# Patient Record
Sex: Female | Born: 1935 | Race: White | Hispanic: No
Health system: Midwestern US, Community
[De-identification: ages and names within clinical notes are randomized; demographics above are authoritative.]

## PROBLEM LIST (undated history)

## (undated) DIAGNOSIS — G8929 Other chronic pain: Secondary | ICD-10-CM

## (undated) DIAGNOSIS — M1990 Unspecified osteoarthritis, unspecified site: Secondary | ICD-10-CM

## (undated) DIAGNOSIS — I10 Essential (primary) hypertension: Secondary | ICD-10-CM

## (undated) DIAGNOSIS — C50919 Malignant neoplasm of unspecified site of unspecified female breast: Secondary | ICD-10-CM

## (undated) DIAGNOSIS — M549 Dorsalgia, unspecified: Secondary | ICD-10-CM

## (undated) DIAGNOSIS — N390 Urinary tract infection, site not specified: Secondary | ICD-10-CM

## (undated) DIAGNOSIS — Z923 Personal history of irradiation: Secondary | ICD-10-CM

## (undated) DIAGNOSIS — K219 Gastro-esophageal reflux disease without esophagitis: Secondary | ICD-10-CM

## (undated) HISTORY — PX: CHOLECYSTECTOMY: SHX55

## (undated) HISTORY — PX: HERNIA REPAIR: SHX51

## (undated) HISTORY — PX: BREAST LUMPECTOMY: SHX2

## (undated) HISTORY — DX: Malignant neoplasm of unspecified site of unspecified female breast: C50.919

## (undated) HISTORY — PX: ABDOMINAL HYSTERECTOMY: SHX81

---

## 1997-07-03 ENCOUNTER — Other Ambulatory Visit: Admission: RE | Admit: 1997-07-03 | Discharge: 1997-07-03 | Payer: Self-pay | Admitting: Family Medicine

## 1998-07-08 ENCOUNTER — Encounter: Payer: Self-pay | Admitting: Family Medicine

## 1998-07-08 ENCOUNTER — Ambulatory Visit (HOSPITAL_COMMUNITY): Admission: RE | Admit: 1998-07-08 | Discharge: 1998-07-08 | Payer: Self-pay | Admitting: Family Medicine

## 1998-07-23 ENCOUNTER — Encounter: Payer: Self-pay | Admitting: Family Medicine

## 1998-07-23 ENCOUNTER — Ambulatory Visit (HOSPITAL_COMMUNITY): Admission: RE | Admit: 1998-07-23 | Discharge: 1998-07-23 | Payer: Self-pay | Admitting: Family Medicine

## 1998-08-09 ENCOUNTER — Encounter: Payer: Self-pay | Admitting: Family Medicine

## 1998-08-09 ENCOUNTER — Ambulatory Visit (HOSPITAL_COMMUNITY): Admission: RE | Admit: 1998-08-09 | Discharge: 1998-08-09 | Payer: Self-pay | Admitting: Family Medicine

## 1998-10-29 ENCOUNTER — Encounter: Payer: Self-pay | Admitting: Family Medicine

## 1998-10-29 ENCOUNTER — Ambulatory Visit (HOSPITAL_COMMUNITY): Admission: RE | Admit: 1998-10-29 | Discharge: 1998-10-29 | Payer: Self-pay | Admitting: Family Medicine

## 1999-07-21 ENCOUNTER — Ambulatory Visit (HOSPITAL_COMMUNITY): Admission: RE | Admit: 1999-07-21 | Discharge: 1999-07-21 | Payer: Self-pay | Admitting: Gastroenterology

## 1999-07-21 ENCOUNTER — Encounter: Payer: Self-pay | Admitting: Gastroenterology

## 2000-12-19 ENCOUNTER — Ambulatory Visit (HOSPITAL_COMMUNITY): Admission: RE | Admit: 2000-12-19 | Discharge: 2000-12-19 | Payer: Self-pay | Admitting: Gastroenterology

## 2001-02-12 ENCOUNTER — Encounter: Payer: Self-pay | Admitting: Gastroenterology

## 2001-02-12 ENCOUNTER — Ambulatory Visit (HOSPITAL_COMMUNITY): Admission: RE | Admit: 2001-02-12 | Discharge: 2001-02-12 | Payer: Self-pay | Admitting: Gastroenterology

## 2001-02-15 ENCOUNTER — Ambulatory Visit (HOSPITAL_COMMUNITY): Admission: RE | Admit: 2001-02-15 | Discharge: 2001-02-15 | Payer: Self-pay | Admitting: Gastroenterology

## 2001-02-15 ENCOUNTER — Encounter: Payer: Self-pay | Admitting: Gastroenterology

## 2001-03-26 ENCOUNTER — Ambulatory Visit (HOSPITAL_COMMUNITY): Admission: RE | Admit: 2001-03-26 | Discharge: 2001-03-26 | Payer: Self-pay | Admitting: Gastroenterology

## 2001-06-18 ENCOUNTER — Encounter: Payer: Self-pay | Admitting: Surgery

## 2001-06-21 ENCOUNTER — Encounter (INDEPENDENT_AMBULATORY_CARE_PROVIDER_SITE_OTHER): Payer: Self-pay | Admitting: Specialist

## 2001-06-22 ENCOUNTER — Encounter: Payer: Self-pay | Admitting: Surgery

## 2001-06-22 ENCOUNTER — Inpatient Hospital Stay (HOSPITAL_COMMUNITY): Admission: RE | Admit: 2001-06-22 | Discharge: 2001-06-24 | Payer: Self-pay | Admitting: Surgery

## 2001-11-18 ENCOUNTER — Ambulatory Visit (HOSPITAL_COMMUNITY): Admission: RE | Admit: 2001-11-18 | Discharge: 2001-11-18 | Payer: Self-pay | Admitting: Gastroenterology

## 2002-10-22 ENCOUNTER — Other Ambulatory Visit: Admission: RE | Admit: 2002-10-22 | Discharge: 2002-10-22 | Payer: Self-pay | Admitting: Family Medicine

## 2005-11-02 ENCOUNTER — Other Ambulatory Visit: Admission: RE | Admit: 2005-11-02 | Discharge: 2005-11-02 | Payer: Self-pay | Admitting: Family Medicine

## 2006-04-27 ENCOUNTER — Inpatient Hospital Stay (HOSPITAL_COMMUNITY): Admission: RE | Admit: 2006-04-27 | Discharge: 2006-04-28 | Payer: Self-pay | Admitting: Orthopaedic Surgery

## 2007-01-29 ENCOUNTER — Other Ambulatory Visit: Admission: RE | Admit: 2007-01-29 | Discharge: 2007-01-29 | Payer: Self-pay | Admitting: Family Medicine

## 2007-02-19 ENCOUNTER — Encounter: Admission: RE | Admit: 2007-02-19 | Discharge: 2007-02-19 | Payer: Self-pay | Admitting: Family Medicine

## 2008-03-22 ENCOUNTER — Encounter: Payer: Self-pay | Admitting: Emergency Medicine

## 2008-03-22 ENCOUNTER — Inpatient Hospital Stay (HOSPITAL_COMMUNITY): Admission: AD | Admit: 2008-03-22 | Discharge: 2008-03-27 | Payer: Self-pay | Admitting: Internal Medicine

## 2008-03-22 ENCOUNTER — Ambulatory Visit: Payer: Self-pay | Admitting: Diagnostic Radiology

## 2010-05-31 LAB — CBC
HCT: 34.9 % — ABNORMAL LOW (ref 36.0–46.0)
Hemoglobin: 12.1 g/dL (ref 12.0–15.0)
Hemoglobin: 13.3 g/dL (ref 12.0–15.0)
MCHC: 34.5 g/dL (ref 30.0–36.0)
MCHC: 34.7 g/dL (ref 30.0–36.0)
MCHC: 33.6 g/dL (ref 30.0–36.0)
MCV: 94.7 fL (ref 78.0–100.0)
MCV: 95 fL (ref 78.0–100.0)
MCV: 96.3 fL (ref 78.0–100.0)
Platelets: 205 10*3/uL (ref 150–400)
RBC: 3.68 MIL/uL — ABNORMAL LOW (ref 3.87–5.11)
RBC: 3.69 MIL/uL — ABNORMAL LOW (ref 3.87–5.11)
RBC: 4.06 MIL/uL (ref 3.87–5.11)
RDW: 13.4 % (ref 11.5–15.5)
RDW: 12.6 % (ref 11.5–15.5)
WBC: 11.5 10*3/uL — ABNORMAL HIGH (ref 4.0–10.5)

## 2010-05-31 LAB — DIFFERENTIAL
Basophils Absolute: 0.1 10*3/uL (ref 0.0–0.1)
Basophils Relative: 0 % (ref 0–1)
Basophils Relative: 1 % (ref 0–1)
Eosinophils Absolute: 0.1 10*3/uL (ref 0.0–0.7)
Eosinophils Absolute: 0.4 10*3/uL (ref 0.0–0.7)
Eosinophils Absolute: 0.5 10*3/uL (ref 0.0–0.7)
Eosinophils Absolute: 0.2 10*3/uL (ref 0.0–0.7)
Eosinophils Relative: 4 % (ref 0–5)
Eosinophils Relative: 5 % (ref 0–5)
Lymphocytes Relative: 14 % (ref 12–46)
Lymphocytes Relative: 25 % (ref 12–46)
Lymphs Abs: 2.8 10*3/uL (ref 0.7–4.0)
Lymphs Abs: 3.6 10*3/uL (ref 0.7–4.0)
Monocytes Absolute: 0.7 10*3/uL (ref 0.1–1.0)
Monocytes Absolute: 1.3 10*3/uL — ABNORMAL HIGH (ref 0.1–1.0)
Monocytes Relative: 11 % (ref 3–12)
Monocytes Relative: 7 % (ref 3–12)
Monocytes Relative: 9 % (ref 3–12)
Monocytes Relative: 5 % (ref 3–12)
Neutro Abs: 7.3 10*3/uL (ref 1.7–7.7)
Neutro Abs: 9.2 10*3/uL — ABNORMAL HIGH (ref 1.7–7.7)
Neutrophils Relative %: 64 % (ref 43–77)
Neutrophils Relative %: 73 % (ref 43–77)
Neutrophils Relative %: 70 % (ref 43–77)

## 2010-05-31 LAB — URINE CULTURE
Colony Count: NO GROWTH
Culture: NO GROWTH

## 2010-05-31 LAB — COMPREHENSIVE METABOLIC PANEL
ALT: 19 U/L (ref 0–35)
AST: 25 U/L (ref 0–37)
Calcium: 10.6 mg/dL — ABNORMAL HIGH (ref 8.4–10.5)
Creatinine, Ser: 1.1 mg/dL (ref 0.4–1.2)
GFR calc Af Amer: 57 mL/min — ABNORMAL LOW (ref >60)
Sodium: 138 mEq/L (ref 135–145)
Total Protein: 7.7 g/dL (ref 6.0–8.3)

## 2010-05-31 LAB — PROTIME-INR: INR: 0.9 (ref 0.00–1.49)

## 2010-05-31 LAB — URINALYSIS, ROUTINE W REFLEX MICROSCOPIC
Bilirubin Urine: NEGATIVE
Glucose, UA: NEGATIVE mg/dL
Hgb urine dipstick: NEGATIVE
Ketones, ur: 15 mg/dL — AB
Protein, ur: 30 mg/dL — AB
Urobilinogen, UA: 0.2 mg/dL (ref 0.0–1.0)

## 2010-05-31 LAB — BASIC METABOLIC PANEL
BUN: 12 mg/dL (ref 6–23)
CO2: 26 mEq/L (ref 19–32)
Calcium: 7.9 mg/dL — ABNORMAL LOW (ref 8.4–10.5)
Chloride: 102 mEq/L (ref 96–112)
Chloride: 108 mEq/L (ref 96–112)
Creatinine, Ser: 0.79 mg/dL (ref 0.4–1.2)
Creatinine, Ser: 0.94 mg/dL (ref 0.4–1.2)
Creatinine, Ser: 0.98 mg/dL (ref 0.4–1.2)
GFR calc Af Amer: >60 mL/min (ref >60)
GFR calc Af Amer: >60 mL/min (ref >60)
GFR calc non Af Amer: 57 mL/min — ABNORMAL LOW (ref >60)
Glucose, Bld: 104 mg/dL — ABNORMAL HIGH (ref 70–99)
Glucose, Bld: 92 mg/dL (ref 70–99)
Sodium: 134 mEq/L — ABNORMAL LOW (ref 135–145)

## 2010-05-31 LAB — CULTURE, BLOOD (ROUTINE X 2): Culture: NO GROWTH

## 2010-05-31 LAB — LACTIC ACID, PLASMA: Lactic Acid, Venous: 4.7 mmol/L — ABNORMAL HIGH (ref 0.5–2.2)

## 2010-05-31 LAB — APTT: aPTT: 23 seconds — ABNORMAL LOW (ref 24–37)

## 2010-05-31 LAB — URINE MICROSCOPIC-ADD ON

## 2010-06-28 NOTE — H&P (Signed)
[name], [name] NO.:  [id number]   MEDICAL RECORD NO.:  [id number]          PATIENT TYPE:  INP   LOCATION:  3312                         FACILITY:  [facility]   PHYSICIAN:  Ramiro Harvest, MD    DATE OF BIRTH:  1920/05/21   DATE OF ADMISSION:  03/22/2008  DATE OF DISCHARGE:                              HISTORY & PHYSICAL   PRIMARY CARE PHYSICIAN:  Chales Salmon. Abigail Miyamoto, M.D., of [facility].   GASTROENTEROLOGIST:  Llana Aliment. Randa Evens, M.D.   HISTORY OF PRESENT ILLNESS:  [name] is an 77 year old white  female with history of paraesophageal hernia status post open Nissen  fundoplication in 1980 with a partial takedown in May 2003 per Dr. Loreta Ave.  She is status post cholecystectomy, status post appendectomy, status  post partial hysterectomy, a history of hypertension, gastroesophageal  reflux disease who presented to the [facility] ED with a  several hour history of right lower quadrant abdominal pain which was a  15 out of a 10.  The patient stated that she had been having  intermittent abdominal pain for the past 2-3 years, usually relieves  with manual disimpaction and having a bowel movement as well as passing  gas.  The patient stated that she woke up this morning around 2 a.m.  with severe right lower quadrant pain which was constant in nature and  radiating to the upper abdomen.  The patient tried manual disimpaction,  did have a good bowel movement, however was still having severe pain.  The patient then tried some Pepto-Bismol with no relief and then finally  woke her daughter up around 6 a.m. and daughter took her to the  emergency department.  Patient does endorse some nausea, some dry  heaves, some chills and some diaphoresis.  The patient does have chronic  constipation as well.  The patient denies any fever, no chest pain, no  shortness of breath, no melena, no hematemesis, no hematochezia, no  diarrhea, no weakness, no cough, no  dysuria, no focal neurological  symptoms.  The patient was seen in the ED.  A CT scan done was  consistent with a distal small bowel obstruction.  CBC done had a white  count of 14.8, hemoglobin of 15.5, hematocrit of 46.2, platelets of 275,  an ANC of 10.3.  Comprehensive metabolic profile was within normal  limits.  Urinalysis was bland.  Coags with a PTT of 23, PT of 12.4, INR  of 0.9, lactic acid level of 4.7 and lipase of 104.  Chest x-ray showed  right basilar atelectasis or scarring.  CT of the abdomen and pelvis  showed no acute intra-abdominal finding, however, showed a distal small  bowel obstruction with focal transition point at the level of the ileum  in the right lower quadrant, no evidence of perforation, no masses  although this could be obscured.  We were called to admit the patient.  [facility] will be consulting on the patient.   PAST MEDICAL HISTORY:  1. Hypertension.  2. Diverticulosis.  3. Gastroesophageal reflux disease.  4. History of skin cancer.  5. Cataracts  status post surgery.  6. A history of open Nissen fundoplication in 1980 with evidence of      herniation and paraesophageal hernia.  7. Status post partial takedown of open fundoplication, per Dr.      Daphine Deutscher, in May of 2003.  8. History of status post cholecystectomy.  9. Status post appendectomy.  10.Status post partial hysterectomy.  11.Status post dilatation of the GE junction per Dr. Randa Evens in      February 2003.  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  13.Hyperlipidemia.  14.Hypertension.  15.Spinal stenosis with facet cyst on the right L3-L4, status post      excision of a canal cyst and decompression on the right L3-L4 per      Dr. Ophelia Charter in March 2008.   HOME MEDICATIONS:  1. Advil PM 200 mg p.o. nightly p.r.n.  2. Aspirin 325 mg p.o. daily.  3. Metoprolol 25 mg p.o. daily.  4. Triamterene/HCTZ 25/37.5 p.o. daily.  5. Lipitor 40 mg p.o.  daily.  6. Nexium 40 mg p.o. daily.   SOCIAL HISTORY:  The patient is widowed, lives in [place] with her  daughter.  No tobacco use.  No alcohol use.  No IV drug use.   FAMILY HISTORY:  Noncontributory.   REVIEW OF SYSTEMS:  As per HPI.  Also positive for intermittent  dysphagia and also positive for intermittent abdominal pain and  constipation.   PHYSICAL EXAMINATION:  Temperature 97.5, blood pressure 169/94, pulse of  84, respiratory rate 20, saturating 100% on room air.  GENERAL:  Patient laying in bed in no apparent distress.  HEENT:  Normocephalic, atraumatic.  Pupils equal, round and reactive to  light and accommodation.  Extraocular movements intact.  Oropharynx is  clear.  No lesions, no exudates.  NECK:  Supple.  No lymphadenopathy.  Dry mucous membranes.  RESPIRATORY:  Lungs are clear to auscultation bilaterally.  CARDIOVASCULAR:  Regular rate, rhythm.  No murmurs, rubs or gallops.  ABDOMEN:  Soft, nontender, nondistended.  Decreased but positive bowel  sounds.  No rebound, no guarding.  EXTREMITIES:  No clubbing, cyanosis or edema.  NEUROLOGICAL:  The patient is alert and oriented x3.  Cranial nerves II-  XII are grossly intact.  No focal deficits.   ADMISSION LABS:  CBC:  White count 14.8, hemoglobin 15.5, platelets 275,  hematocrit 46.2, ANC of 10.3, lactic acid of 4.7, lipase 104, PTT 23, PT  12.4, INR 0.9, sodium of 138, potassium 4.1, chloride 100, bicarbonate  25, BUN 23, creatinine 1.1, glucose 143, bilirubin 0.6, alkaline  phosphatase 112, AST 25, ALT 19.  Protein of 7.7, albumin 4.3, calcium  of 10.6.  Urinalysis:  UA was yellow, cloudy, specific gravity 1.026, pH  of 5.5, glucose negative, bilirubin negative, ketones 15, blood  negative, protein 30, urobilinogen 0.2, nitrite negative, leukocytes  negative.  Urine microscopy:  WBC 0-2, RBC 0-2, bacteria is few.  A  chest x-ray showed right basilar atelectasis or scarring.  CT of the  abdomen and pelvis  shows no acute intra-abdominal findings.  CT of the  pelvis with a distal small bowel obstruction with focal transition point  at the level of the ileum in the right lower quadrant, no evidence of  perforation, no mass lesion is seen although this could be obscured,  differential also includes adhesions.   ASSESSMENT AND PLAN:  Ms. [name] is an 77 year old female with  a history of prior esophageal hernia status post open Nissen  fundoplication  in 1980 with status post partial takedown of open  fundoplication, per Dr. Daphine Deutscher, in May 2003 status post cholecystectomy,  status post appendectomy, status post partial hysterectomy presenting to  the emergency department with right lower quadrant pain, found to have a  distal small bowel obstruction.  1. Distal small bowel obstruction likely secondary to adhesions versus      a neoplasm.  Admit to Step Down Unit.  Will make nothing by mouth.      Will place on bowel rest.  Will check a magnesium level, place on      IV fluids, supportive care, pain management, serial abdominal x-      rays.  Will consult with general surgery for further evaluation and      management.  2. Leukocytosis.  Likely a reactive leukocytosis secondary to problem      number 1 versus an infectious etiology.  However, lactic acid level      is elevated at 4.7, lipase level is normal.  UA is negative.  Chest      x-ray is negative.  Blood cultures are pending.  Will place      empirically on IV Zosyn for now and follow.  3. Dehydration.  Hydrate with intravenous fluids.  4. Gastroesophageal reflux disease.  Protonix.  5. Diverticulosis.  6. Hyperlipidemia.  Hold Lipitor.  7. Hypertension.  Hold blood pressure medications for now.  8. Status post Nissen fundoplication with partial takedown.  9. Prophylaxis.  Protonix for gastrointestinal prophylaxis, sequential      compression devices for deep vein thrombosis prophylaxis.   It has been a pleasure taking care  of Ms. [name].      Ramiro Harvest, MD  Electronically Signed     DT/MEDQ  D:  03/22/2008  T:  03/22/2008  Job:  [id number]   cc:   Chales Salmon. Abigail Miyamoto, M.D.

## 2010-06-28 NOTE — Discharge Summary (Signed)
[name], [name]             ACCOUNT NO.:  [id number]   MEDICAL RECORD NO.:  [id number]          PATIENT TYPE:  INP   LOCATION:  5121                         FACILITY:  [facility]   PHYSICIAN:  [name], M.D.DATE OF BIRTH:  1921/02/07   DATE OF ADMISSION:  03/22/2008  DATE OF DISCHARGE:  03/27/2008                               DISCHARGE SUMMARY   DISCHARGE DIAGNOSES:  1. Small bowel obstruction - resolved.  2. Uncontrolled hypertension.  3. Gastroesophageal reflux disease.  4. History of diverticulosis.  5. History of spinal stenosis.  6. History of hypercholesterolemia.  7. History of open Nissen fundoplication in 1980, with evidence of      herniation and paraesophageal hernia, status post partial takedown      of open fundoplication per Dr. Daphine Deutscher in May 2003.  8. History of cholecystectomy in the past.  9. History of appendectomy.  10.History of partial hysterectomy.  11.History of skin cancer.   PROCEDURES AND STUDIES:  1. CT scan of abdomen and pelvis - distal small bowel obstruction with      focal transition point at the level of the ileum in the right lower      quadrant.  No evidence for perforation.  No mass lesion identified.      Differential considerations include adhesion.  2. Follow-up abdominal x-ray on March 25, 2008 - improved small      bowel distention, likely related to resolving obstruction.   CONSULTATIONS:  [facility], Dr. Violeta Gelinas.   BRIEF HISTORY:  The patient is an 77 year old white female with the  above-listed medical problems who presented with right lower quadrant  abdominal pain, along with nausea and dry heaves.  She reported a  history of chronic constipation and that she sometimes had to do manual  disimpaction that usually relieved her symptoms.  She was seen in the ER  and a CT scan of her abdomen was done which was consistent with a distal  small bowel obstruction.  She was noted to have an elevated white cell  count of 14.8 and she was admitted for further evaluation and  management.   Please see the full admission history and physical dictated on March 22, 2008, by Dr. Ramiro Harvest for the details of the admission  physical exam, as well as the laboratory data.   HOSPITAL COURSE:  1. Small bowel obstruction - as discussed above upon admission she was      kept n.p.o., hydrated with IV fluids.  Surgery was consulted and      followed the patient.  Because her abdominal pain was persisting,      an NG tube was placed.  She was also started on IV Reglan.      Following this, she began having bowel movements and subsequently      her abdominal pain resolved and surgery discontinued the NG tube      once the output had slowed down.  She had several follow-up      abdominal films while in the hospital and they began to show slight      improvement in  small bowel distention on March 24, 2008, and      another follow-up abdominal x-ray on March 25, 2008, also showed      improved small bowel distention, likely relating to the resolving      obstruction.  Surgery followed the patient and started her on a      liquid diet.  She continued to improve and so they followed and      advanced her diet to solids.  She is tolerating p.o. well and      having bowel movements at this time.  Surgery saw her on rounds      today and have indicated it is okay for her to be discharged from      their standpoint.  They also have indicated that she does not need      any outpatient surgery follow-up.  The impression was that the      small bowel obstruction was likely secondary to adhesions given her      previous abdominal surgeries.  2. Uncontrolled hypertension - the patient was kept n.p.o. while in      the hospital and so initially was unable to get her oral p.o.      medications.  Her blood pressures initially did well, but      subsequently were uncontrolled and she was started on scheduled IV       beta-blockers.  With this, her blood pressures have improved.  Her      last blood pressure on recheck today was 126/74.  She is to      continue her outpatient medications upon discharge and follow up      with her primary care physician.  3. GERD - she was maintained on the PPI during her hospital stay.  4. Volume depletion - she was hydrated with IV fluids while in the      hospital.  5. Leukocytosis - she was initially empirically started on IV      antibiotics pending workup - her urinalysis was negative for      infection and chest x-ray with no acute infiltrates.  The      impression was that this was likely reactive secondary to #1.  The      leukocytosis has resolved, the antibiotics were discontinued.  She      has remained afebrile and hemodynamically stable and will not      require any further antibiotics upon discharge.  Her last white      cell count today prior to discharge is 8.0.  6. Her other chronic medical problems remained stable during this      hospital stay and she is to continue her outpatient medications      upon discharge.   DISCHARGE MEDICATIONS:  She is to continue her preadmission medications.  1. Aspirin 325 mg daily.  2. Metoprolol 25 mg daily.  3. Triamterene/hydrochlorothiazide 25/37.5 mg p.o. daily.  4. Lipitor 40 mg p.o. daily.  5. Advil PM p.r.n.   DISCHARGE CONDITION:  Improved/stable.   FOLLOW-UP CARE:  Dr. Abigail Miyamoto in 1-2 weeks, call for appointment.      [name], M.D.  Electronically Signed     ACV/MEDQ  D:  03/27/2008  T:  03/27/2008  Job:  [id number]   cc:   Chales Salmon. Abigail Miyamoto, M.D.  Gabrielle Dare [name], M.D.

## 2010-06-28 NOTE — Consult Note (Signed)
[name]             ACCOUNT NO.:  [id number]   MEDICAL RECORD NO.:  [id number]          PATIENT TYPE:  INP   LOCATION:  3312                         FACILITY:  [facility]   PHYSICIAN:  Gabrielle Dare. [name], M.D.DATE OF BIRTH:  [birth year]   DATE OF CONSULTATION:  03/22/2008  DATE OF DISCHARGE:                                 CONSULTATION   PRIMARY CARE PHYSICIAN:  Dr. Abigail Miyamoto.   GI PHYSICIAN:  James L. Randa Evens, M.D.   REASON FOR CONSULTATION:  Small bowel obstruction.   HISTORY OF PRESENT ILLNESS:  [name] is an 77 year old white female  with history of several abdominal operations, the most recent one is a  laparoscopic redo Nissen fundoplication by Dr. Daphine Deutscher from our practice  in 2003.  The patient developed right lower quadrant abdominal pain this  morning with associated dry heaves.  She was seen at [facility] where she underwent CT scan of the abdomen and  pelvis.  This shows distal small bowel obstruction likely secondary to  adhesions.  She was given some pain medication down there.  Her pain has  resolved at this time, and she has no current nausea.  She was accepted  and transferred for admission here by [facility].   PAST MEDICAL HISTORY:  1. Hypertension.  2. Diverticulosis.  3. Spinal stenosis.  4. Hypercholesterolemia.  5. GERD.   PAST SURGICAL HISTORY:  Open Nissen and hiatal hernia repair,  laparoscopic redo Nissen, appendectomy, skin cancer removal,  cholecystotomy, and spine surgery.   HOME MEDICATIONS:  Advil, aspirin, metoprolol,  triamterene/hydrochlorothiazide, Lipitor, and Nexium.   ALLERGIES:  CODEINE causing nausea.   REVIEW OF SYSTEMS:  Significant for the GI findings as above.  The  remainder was unremarkable.   PHYSICAL EXAMINATION:  VITAL SIGNS:  Temperature 97.5, blood pressure  169/94, heart rate 84, respirations 20, and saturations 100% on room  air.  GENERAL:  She is awake and alert.   She is in no distress.  HEENT:  She is normocephalic.  Pupils equal and reactive.  Oral mucosa  is dry.  NECK:  Supple with no tenderness.  LUNGS:  Clear to auscultation with good respiratory effort.  CARDIAC:  Heart is regular with no murmurs.  Impulses palpable in the  left chest.  Distal pulses are 1+ with no significant peripheral edema.  ABDOMEN:  Soft with minimal distention.  There is no significant  tenderness.  No masses are felt.  Bowel sounds are hypoactive.  No  organomegaly is detected on palpation.  SKIN:  Warm and dry.  No rashes are noted.  MUSCULOSKELETAL:  There is no tenderness or deformity.   LABORATORY STUDIES:  Sodium 138, potassium 4.1, chloride 100, CO2 of 25,  BUN 23, creatinine 1.1, and glucose 143.  White blood cell count 14.8  and hemoglobin 15.5.  Liver function test within normal limits.  Lipase  104.  INR 0.9.   IMPRESSION:  Small bowel obstruction likely secondary to adhesions.   RECOMMENDATIONS:  I agree with IV fluid resuscitation and bowel rest as  ordered by the medical  service and I discussed this case with Dr.  [name] from [facility] hospitalist.  I would recommend placing NG tube if  she vomits.  We will follow along with you closely.  If she does not  improve, she may need exploratory laparotomy with lysis of adhesions  over the next 24 to 48 hours and the plan was discussed in detail with  the patient and her daughter.  Questions were answered.      Gabrielle Dare [name], M.D.  Electronically Signed     BET/MEDQ  D:  03/22/2008  T:  03/23/2008  Job:  [id number]

## 2010-06-29 ENCOUNTER — Emergency Department (HOSPITAL_COMMUNITY): Payer: Medicare Other

## 2010-06-29 ENCOUNTER — Emergency Department (HOSPITAL_COMMUNITY)
Admission: EM | Admit: 2010-06-29 | Discharge: 2010-06-29 | Disposition: A | Discharge status: Home / Self Care | Payer: Medicare Other | Attending: Emergency Medicine | Admitting: Emergency Medicine

## 2010-06-29 ENCOUNTER — Encounter: Payer: Self-pay | Admitting: Cardiology

## 2010-06-29 DIAGNOSIS — M1990 Unspecified osteoarthritis, unspecified site: Secondary | ICD-10-CM | POA: Insufficient documentation

## 2010-06-29 DIAGNOSIS — Z7982 Long term (current) use of aspirin: Secondary | ICD-10-CM | POA: Insufficient documentation

## 2010-06-29 DIAGNOSIS — R0602 Shortness of breath: Secondary | ICD-10-CM | POA: Insufficient documentation

## 2010-06-29 DIAGNOSIS — I10 Essential (primary) hypertension: Secondary | ICD-10-CM | POA: Insufficient documentation

## 2010-06-29 DIAGNOSIS — Z79899 Other long term (current) drug therapy: Secondary | ICD-10-CM | POA: Insufficient documentation

## 2010-06-29 DIAGNOSIS — R61 Generalized hyperhidrosis: Secondary | ICD-10-CM | POA: Insufficient documentation

## 2010-06-29 DIAGNOSIS — K219 Gastro-esophageal reflux disease without esophagitis: Secondary | ICD-10-CM | POA: Insufficient documentation

## 2010-06-29 DIAGNOSIS — E7800 Pure hypercholesterolemia, unspecified: Secondary | ICD-10-CM | POA: Insufficient documentation

## 2010-06-29 DIAGNOSIS — F341 Dysthymic disorder: Secondary | ICD-10-CM | POA: Insufficient documentation

## 2010-06-29 DIAGNOSIS — R609 Edema, unspecified: Secondary | ICD-10-CM | POA: Insufficient documentation

## 2010-06-29 DIAGNOSIS — R079 Chest pain, unspecified: Secondary | ICD-10-CM | POA: Insufficient documentation

## 2010-06-29 LAB — COMPREHENSIVE METABOLIC PANEL
ALT: 25 U/L (ref 0–35)
AST: 24 U/L (ref 0–37)
Alkaline Phosphatase: 101 U/L (ref 39–117)
CO2: 31 mEq/L (ref 19–32)
Calcium: 10.3 mg/dL (ref 8.4–10.5)
GFR calc Af Amer: >60 mL/min (ref >60)
Glucose, Bld: 110 mg/dL — ABNORMAL HIGH (ref 70–99)
Potassium: 3.8 mEq/L (ref 3.5–5.1)
Sodium: 135 mEq/L (ref 135–145)
Total Protein: 6.8 g/dL (ref 6.0–8.3)

## 2010-06-29 LAB — CBC
HCT: 41 % (ref 36.0–46.0)
Hemoglobin: 14 g/dL (ref 12.0–15.0)
MCHC: 34.1 g/dL (ref 30.0–36.0)
MCV: 91.5 fL (ref 78.0–100.0)
RDW: 13.1 % (ref 11.5–15.5)

## 2010-06-29 LAB — POCT CARDIAC MARKERS: CKMB, poc: <1 ng/mL — ABNORMAL LOW (ref 1.0–8.0)

## 2010-07-01 NOTE — Procedures (Signed)
[facility]  Patient:    [name], [name] Visit Number: [id number] MRN: [id number]          Service Type: SUR Location: 1W 0153 01 Attending Physician:  Katha Cabal Dictated by:   Sabino Gasser, M.D. Proc. Date: 06/21/01 Admit Date:  06/21/2001   CC:         [place]. Daphine Deutscher, M.D.  Chales Salmon. Abigail Miyamoto, M.D.  James L. Randa Evens, M.D.   Procedure Report  PROCEDURE:  Upper endoscopy.  HISTORY:  I was asked by Dr. Daphine Deutscher to endoscope this patient in the operating room because of difficulty he had in passing the endoscope so that he could visualize the stomach and GE junction.  ANESTHESIA:  General anesthesia was already given.  DESCRIPTION OF PROCEDURE:  With the patient in the recumbent position, the Olympus videoscopic endoscope was inserted into the mouth and passed blindly into the esophagus easily. We were able to pass distally through the esophagus into the stomach, which appeared grossly normal. We placed the endoscope in retroflexion to view the GE junction from below and Dr. Daphine Deutscher assessed that there was still some degree of wrap present. A photograph was taken at this point. The endoscope was then straightened and withdrawn and placed at the GE junction at Dr. Norva Riffle request. At that point, they were able to identify surgically the site of the GE junction laparoscopically. The endoscope was then withdrawn partially and it was decided to leave the endoscope in place at this point after suctioning air out of the stomach in case this was needed further. The patient tolerated the endoscopic procedure well without apparent complications.  FINDINGS:  Partial takedown of open fundoplication done previously; otherwise an unremarkable examination. Dictated by:   Sabino Gasser, M.D. Attending Physician:  Katha Cabal DD:  06/21/01 TD:  06/22/01 Job: [id number]

## 2010-07-01 NOTE — Op Note (Signed)
[facility]  Patient:    [name], [name] Visit Number: [id number] MRN: [id number]          Service Type: SUR Location: 4W 0462 01 Attending Physician:  Katha Cabal Dictated by:   [place] Daphine Deutscher, M.D. Admit Date:  06/21/2001 Discharge Date: 06/24/2001   CC:         Chales Salmon. Abigail Miyamoto, M.D.  James L. Randa Evens, M.D.   Operative Report  PREOPERATIVE DIAGNOSIS:  Prior open Nissen fundoplication by Dr. Langston Masker in the early 1980s with evidence of paraesophageal recurrence, dysphagia.  PROCEDURES:  Laparoscopic revision of prior open Nissen fundoplication with laparoscopic takedown of wrap, closure of diaphragm, and redo laparoscopic Nissen fundoplication.  COURSE IN HOSPITAL:  [name] is a 77 year old lady who underwent the aforementioned operation on Friday, Jun 21, 2001.  Postoperatively, she did well.  On Saturday, she had a swallow which showed some slow emptying but no evidence of leak.  The patient tolerated liquids well on Sunday, and by Monday, May 12, was ready for discharge.  DISCHARGE MEDICATIONS:  She was given Mepergan Fortis to take for pain.  DISCHARGE INSTRUCTIONS:  An instruction sheet with a postoperative Nissen diet modification was given to her.  FOLLOWUP:  She was instructed to return to the office in two weeks.  CONDITION UPON DISCHARGE:  Improved. Dictated by:   [place] Daphine Deutscher, M.D. Attending Physician:  Katha Cabal DD:  06/24/01 TD:  06/25/01 Job: [id number]

## 2010-07-01 NOTE — Op Note (Signed)
   NAME:  [name], [name]                       ACCOUNT NO.:  [id number]   MEDICAL RECORD NO.:  [id number]                   PATIENT TYPE:  AMB   LOCATION:  ENDO                                 FACILITY:  [facility]   PHYSICIAN:  James L. Malon Kindle., M.D.          DATE OF BIRTH:  March 18, 1920   DATE OF PROCEDURE:  11/18/2001  DATE OF DISCHARGE:                                 OPERATIVE REPORT   PROCEDURE PERFORMED:  Colonoscopy.   ENDOSCOPIST:  Llana Aliment. Edwards, M.D.   MEDICATIONS:  Fentanyl 75 mcg, Versed 7.5 mg IV.   INSTRUMENT USED:  Pediatric Olympus video colonoscope.   INDICATIONS FOR PROCEDURE:  The patient has a strong family history of colon  cancer, has never had a colonoscopy.  She is status post Nissen  fundoplication times two.   DESCRIPTION OF PROCEDURE:  The procedure had been explained to the patient  and consent obtained.  With the patient in the left lateral decubitus  position, the pediatric adjustable Olympus video colonoscope was inserted  and advanced under direct visualization.  The prep was good.  The patient  had extensive diverticular disease.  Multiple maneuvers and some time were  taken to pass the left colon.  After we were able to pass this, we were able  to advance to the cecum.  The ileocecal valve and appendiceal orifice were  seen.  The scope was withdrawn and the cecum, ascending colon, hepatic  flexure, transverse colon, splenic flexure, descending and sigmoid colon  were seen well.  Extensive diverticular disease of the left colon,  particularly the sigmoid.  Multiple diverticula, no gross polyps seen.  The  scope was withdrawn to the rectum.  The rectum was free of polyps.  The  scope was withdrawn.  The patient tolerated the procedure well.   ASSESSMENT:  1. Extensive diverticulosis.  2. Family history of colon cancer with no obvious neoplasia.   PLAN:  Will recommend yearly hemoccults with colonoscopy in the future only  for specific  indications such as heme positive stool or anemia.                                                James L. Malon Kindle., M.D.    Waldron Session  D:  11/18/2001  T:  11/18/2001  Job:  [id number]   cc:   [place] Daphine Deutscher, M.D.  Fax: [phone]   Chales Salmon. Abigail Miyamoto, M.D.

## 2010-07-01 NOTE — Procedures (Signed)
[place]. [facility]  Patient:    [name], [name] Visit Number: [id number] MRN: [id number]          Service Type: END Location: ENDO Attending Physician:  Orland Mustard Dictated by:   Llana Aliment. Randa Evens, M.D. Proc. Date: 02/12/01 Admit Date:  02/12/2001 Discharge Date: 02/12/2001   CC:         Chales Salmon. Abigail Miyamoto, M.D.  [place] Daphine Deutscher, M.D.   Procedure Report  DATE OF BIRTH:  September 08, 1920.  PROCEDURE:  Esophageal manometry.  DESCRIPTION OF PROCEDURE:  The procedure was performed in the [facility] manometry lab in the usual fashion.  No provocative studies were performed, and the results are as follows:  1. Lower esophageal sphincter:  LES pressure is 26.6 with only 68%    relaxation. 2. Esophageal body:  Mean amplitude in the distal esophagus 71 mm, with mean    duration of 8.2 seconds.  The patient had a number of wet swallows that    were obtained.  All these appeared to have normal peristalsis. 3. Upper esophageal sphincter:   Does tend to relax with swallowing.  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  This apparently may be related to her previous antireflux surgery.  She is having a lot of difficulty swallowing, and it may well be that this is not entirely relaxing.  PLAN:  Will have her see Dr. Daphine Deutscher.  Will plan on seeing her back in the office. Dictated by:   Llana Aliment. Randa Evens, M.D. Attending Physician:  Orland Mustard DD:  02/28/01 TD:  03/01/01 Job: [id number]

## 2010-07-01 NOTE — Op Note (Signed)
[facility]  Patient:    [name], [name] Visit Number: [id number] MRN: [id number]          Service Type: SUR Location: 4W 0462 01 Attending Physician:  Katha Cabal Dictated by:   [place] Daphine Deutscher, M.D. Proc. Date: 06/21/01 Admit Date:  06/21/2001   CC:         Chales Salmon. Abigail Miyamoto, M.D.  James L. Randa Evens, M.D.   Operative Report  PREOPERATIVE DIAGNOSIS:  Open Nissen fundoplication in 1980 per Dr. _______ Langston Masker with evidence of herniation and paraesophageal hernia.  PROCEDURE:  Laparoscopic takedown of prior wrap, closure of diaphragm, redo Nissen fundoplication with the laparoscope.  SURGEON:  [place]. Daphine Deutscher, M.D.  ASSISTANT:  Sharlet Salina T. Hoxworth, M.D.  NOTE:  Takedown of adhesions from prior surgery including transverse colon to the anterior abdominal wall, liver from stomach--1-1/2 hours.  TOTAL PROCEDURE TIME:  4-1/2 hours.  DESCRIPTION OF PROCEDURE:  Ms. Scaturro was taken to Room #1 on Friday, Jun 21, 2001 and given general anesthesia. The abdomen was prepped with Betadine and draped sterilely. Just in the supraumbilical region where her previous midline incision was, I made a longitudinal incision through the old incision and then removed an interrupted Prolene suture and entered the abdomen. She had many adhesions to the anterior abdominal wall and after putting the 30 degree scope in, I withdrew the device and put my finger in and bluntly dissected _______ from the anterior abdominal wall. Then eventually the 45 degree scope and with a 10/11 in the right upper quadrant and a 10/11 in the left upper quadrant, we used the Harmonic scalpel and did a very careful tedious dissection and took down all of these adhesions using the Harmonic scalpel. This was very difficult and very tedious and it took 1-1/2 hours of dissection time. When this had been done, we had moved to the liver region which was also stuck to the anterior  abdominal wall. We took this down and then separated the stomach from the liver. We encountered some bleeding from the left lateral segment but this was controlled with the Harmonic scalpel.  We then carefully did a laparoscopic dissection and eventually showed where it appeared her stomach had gone above the wrap and was up in the chest. We dissected the hiatus and were able to reduce this herniated wrap. We then delineated the right and left crus. We then found the sutures where the wrap had been put together and removed those, and were able to undo the wrap and then I was unable to allow the stomach to go back around to its normal position. At this point, we had the endoscope and Dr. Virginia Rochester was the only one available that came in and he performed endoscopy slipping the endoscope into the stomach where we got a good retroflex view of the EG junction and then pulled it back where we could see the Z-line and this corresponded to our estimates on the outside.  The scope was then eventually just pulled out. We then closed the hiatus with a total of five sutures posteriorly and this were all tied extracorporeally. I then brought the stomach around and did a shoeshine maneuver and used the contiguous portion of the stomach to recreate a wrap suturing it high on the esophagus above the Z-line and then tied that down extracorporally. This was followed with two intracorporeally tied sutures, again incorporating the stomach, esophagus, and the wrapped stomach. Prior to doing this, she had a big lump  of fat up there at the cardia in the notch and I took that off and it contained a large lymph node. I used the Harmonic scalpel to do that. This was sent for permanent sections. The wrap looked nice when we had completed. We took a picture for the chart and then we looked around at the abdominal cavity and did not see any evidence of any bleeding or any visceral perforation. The port sites were all  injected with Marcaine. The umbilical port was repaired under direct vision with three sutures of 0 Vicryl. The other ports had been placed very obliquely and closed when the abdomen was deflated. The area was surveyed and looked good. The patient seemed to tolerate the procedure well. The wounds were closed with 4-0 Vicryl and benzoin and Steri-Strips. She was awakened and taken to the recovery room in satisfactory condition. Dictated by:   [place] Daphine Deutscher, M.D. Attending Physician:  Katha Cabal DD:  06/21/01 TD:  06/23/01 Job: [id number]

## 2010-07-01 NOTE — Procedures (Signed)
[place]. [facility]  Patient:    [name], [name] Visit Number: [id number] MRN: [id number]          Service Type: END Location: ENDO Attending Physician:  Orland Mustard Dictated by:   Llana Aliment. Randa Evens, M.D. Proc. Date: 03/26/01 Admit Date:  03/26/2001   CC:         [place]. Daphine Deutscher, M.D.  Chales Salmon. Abigail Miyamoto, M.D.   Procedure Report  DATE OF BIRTH:  July 21, 1918.  PROCEDURE:  Esophagogastroduodenoscopy with TTS balloon dilatation of gastroesophageal junction.  HISTORY:  [name] is a nice 77 year old woman who has previously undergone an open Nissen fundoplication many years ago by Dr. Verne Carrow and has had persistent problems since then.  The patient has had problems with pain and swallowing.  She has seen Dr. Daphine Deutscher, and he has considered redoing her Nissen fundoplication.  Her esophageal manometry showed an LES pressure of 25.6 with some delayed motility issues.  The patient has continued to have lots of symptoms, and this procedure is done after discussion with Dr. Daphine Deutscher to see if enlarging the opening would be helpful.  DESCRIPTION OF PROCEDURE:  The procedure, including potential risks and benefits, were explained to the patient.  The possibility of bleeding and perforation were explained.  With the patient in the left lateral decubitus position, the Olympus video scope was inserted and advanced down into the distal esophagus.  There was some retained liquid material in the distal esophagus.  The wrap appeared intact.  It easily allowed passage of the scope. The stomach was identified and pylorus identified and passed.  The duodenum, including the bulb and second portion, was unremarkable.  The antrum and body were normal.  Fundus and cardia were seen well on retroflex view and were normal.  The scope was withdrawn several times back and forth through the GE junction.  Although it was tight, it could easily pass the scope, which  is about 27 mm.  I elected to go ahead with the 12 mm or 36 French TTS balloon, which was inflated with some small amount of heme right at the GE junction. It stretched easily, and the balloon easily passed within the opening while inflated.  Again, it was just a small amount of heme.  The scope and balloon were then withdrawn.  I elected not to try to dilate this further, since it was not clear that this was clearly stenotic.  The scope was withdrawn.  The patient tolerated the procedure well.  ASSESSMENT:  Dilation of gastroesophageal junction to 36 [place].  PLAN:  Will follow the patient clinically, have her call to report next week, and see how she does.  She will remain on clear liquids for the next several hours. Dictated by:   Llana Aliment. Randa Evens, M.D. Attending Physician:  Orland Mustard DD:  03/26/01 TD:  03/26/01 Job: [id number]

## 2010-07-01 NOTE — Procedures (Signed)
[place]. [facility]  Patient:    [name], BOCCHINO Visit Number: [id number] MRN: [id number]          Service Type: END Location: ENDO Attending Physician:  Orland Mustard Dictated by:   Llana Aliment. Randa Evens, M.D. Proc. Date: 12/19/00 Admit Date:  12/19/2000   CC:         Chales Salmon. Abigail Miyamoto, M.D.                           Procedure Report  PROCEDURE:  Esophagogastroduodenoscopy.  MEDICATIONS:  Fentanyl 25 mcg, Versed 5 mg IV.  INDICATION:  A 77 year old who had an open Nissen fundoplication a number of years ago, had dysmotility of the esophagus on an upper GI with a questionable paraesophageal hiatal hernia.  She has had vague upper GI symptoms. She was started back on Nexium, which had not really helped.  The procedure is done to evaluate the upper GI tract.  DESCRIPTION OF PROCEDURE:  The procedure had been explained to the patient and consent obtained.  With the patient in the left lateral decubitus position, the Olympus adult video endoscope was inserted and advanced under direct visualization.  The distal esophagus was reached.  The patient was seen to have a hiatal hernia.  Otherwise, everything looked okay there.  We easily passedinto the stomach.  The pyloric channel was identified and passed.  The duodenum, including the bulb and the second portion, were unremarkable.  The scope was withdrawn back into the stomach, and the antrum and body were normal.  Fundus and cardia seen well on the retroflex view and were normal. The wrap appeared to be completely intact.  There was a hiatal hernia with some gastric mucosa up in the chest, but there was no obvious stricture at the junction.  The scope was withdrawn.  The patient tolerated the procedure well.  ASSESSMENT:  Vague upper gastrointestinal symptoms, could well be due to esophageal dysmotility, regurgitation of contents.  There was no obvious reflux and no obvious paraesophageal hiatal  hernia.  PLAN:  Will give a trial of Reglan and see back in the office in three to four weeks. Dictated by:   Llana Aliment. Randa Evens, M.D. Attending Physician:  Orland Mustard DD:  12/19/00 TD:12/20/00 Job: [id number]

## 2010-07-01 NOTE — Op Note (Signed)
NAMELANIKA, COLGATE             ACCOUNT NO.:  [id number]   MEDICAL RECORD NO.:  [id number]          PATIENT TYPE:  INP   LOCATION:  2899                         FACILITY:  [facility]   PHYSICIAN:  Mark C. Ophelia Charter, M.D.    DATE OF BIRTH:  02-Apr-1920   DATE OF PROCEDURE:  04/27/2006  DATE OF DISCHARGE:                               OPERATIVE REPORT   PREOPERATIVE DIAGNOSIS:  Spinal stenosis with facet cyst right L3-L4.   POSTOPERATIVE DIAGNOSIS:  Spinal stenosis with facet cyst right L3-L4.   PROCEDURE:  Excision of canal cyst and decompression right L3-L4.   SURGEON:  Mark C. Ophelia Charter, M.D.   ASSISTANT:  Wende Neighbors, P.A.-C.   ANESTHESIA:  General plus 7 mL of Marcaine local.   ESTIMATED BLOOD LOSS:  100 mL.   DESCRIPTION OF PROCEDURE:  After induction of general anesthesia,  sterile prepping and draping the patient prone on chest rolls, careful  padding, the area was squared with towels, Betadine applied, laminectomy  sheet was placed.  Needle localization cross table lateral x-ray showed  the needle was directly over the L3-L4 level as planned.  The patient  had lumbar scoliosis with anterior canal facet cyst which has not  responded to conservative treatment of epidural steroids and was having  radiculopathy secondary to the facet cyst directly at the level of disc.  There was no disc herniation but there was some multilevel moderate  stenosis.   Taylor retractors were placed laterally.  Subperiosteal dissection of  the lamina and laminotomy was performed at L3 on the right and then  removing some bone at the level of the pedicle, leaving the top half to  1/3 of the lamina.  With the patient's scoliosis, care was taken not to  damage the pars and only take the amount of facet necessary in order to  remove bone out to the level of the pedicles to prevent making her  unstable.  Once this was performed, there were hypertrophic big chunks  of the ligamentum which was removed.   With this, there was a small gush  of clear fluid, the cyst was identified, dura was intact, and there was  synovium lining the cyst.  With the dura carefully protected, the cyst  was removed.  All chunks of ligament were removed in the cephalad  portion of the laminotomy area there was epidural fat consistent with  satisfactory decompression.  The foramina was enlarged.  There was some  facet spurs hanging up the supra-articular facet which was removed.  Centrally, a little bit of the hypertrophic ligamentum was removed seen  just underneath the spinous process across the midline to give her a  little bit more room.  After irrigation with saline solution, inspection  of the disc showed that there was no disc herniation, nerve root was  free, the cyst was completely excised, and the fascia was then closed  with 0 Vicryl.  The draped operative microscope had  been used as soon as the ligament was visualized and was then removed.  0 Vicryl in the fascia, 2-0 Vicryl in the subcutaneous tissue, 4-0  Vicryl subcuticular skin closure.  Tincture of Benzoin, Steri-Strips,  and postop dressing was applied.      Mark C. Ophelia Charter, M.D.  Electronically Signed     MCY/MEDQ  D:  04/27/2006  T:  04/28/2006  Job:  [id number]

## 2010-08-10 ENCOUNTER — Emergency Department (INDEPENDENT_AMBULATORY_CARE_PROVIDER_SITE_OTHER): Payer: Medicare Other

## 2010-08-10 ENCOUNTER — Emergency Department (HOSPITAL_BASED_OUTPATIENT_CLINIC_OR_DEPARTMENT_OTHER)
Admission: EM | Admit: 2010-08-10 | Discharge: 2010-08-10 | Disposition: A | Discharge status: Home / Self Care | Payer: Medicare Other | Attending: Emergency Medicine | Admitting: Emergency Medicine

## 2010-08-10 DIAGNOSIS — I10 Essential (primary) hypertension: Secondary | ICD-10-CM | POA: Insufficient documentation

## 2010-08-10 DIAGNOSIS — E7800 Pure hypercholesterolemia, unspecified: Secondary | ICD-10-CM | POA: Insufficient documentation

## 2010-08-10 DIAGNOSIS — R5381 Other malaise: Secondary | ICD-10-CM | POA: Insufficient documentation

## 2010-08-10 DIAGNOSIS — F341 Dysthymic disorder: Secondary | ICD-10-CM | POA: Insufficient documentation

## 2010-08-10 DIAGNOSIS — R059 Cough, unspecified: Secondary | ICD-10-CM

## 2010-08-10 DIAGNOSIS — R0602 Shortness of breath: Secondary | ICD-10-CM

## 2010-08-10 DIAGNOSIS — N390 Urinary tract infection, site not specified: Secondary | ICD-10-CM | POA: Insufficient documentation

## 2010-08-10 DIAGNOSIS — R05 Cough: Secondary | ICD-10-CM

## 2010-08-10 LAB — URINALYSIS, ROUTINE W REFLEX MICROSCOPIC
Bilirubin Urine: NEGATIVE
Ketones, ur: NEGATIVE mg/dL
Nitrite: NEGATIVE
Protein, ur: NEGATIVE mg/dL
Urobilinogen, UA: 1 mg/dL (ref 0.0–1.0)
pH: 7 (ref 5.0–8.0)

## 2010-08-10 LAB — DIFFERENTIAL
Basophils Relative: 0 % (ref 0–1)
Eosinophils Absolute: 0.2 10*3/uL (ref 0.0–0.7)
Eosinophils Relative: 3 % (ref 0–5)
Lymphs Abs: 2.7 10*3/uL (ref 0.7–4.0)
Monocytes Absolute: 1 10*3/uL (ref 0.1–1.0)
Monocytes Relative: 12 % (ref 3–12)
Neutrophils Relative %: 55 % (ref 43–77)

## 2010-08-10 LAB — BASIC METABOLIC PANEL
CO2: 28 mEq/L (ref 19–32)
Calcium: 10.3 mg/dL (ref 8.4–10.5)
Chloride: 92 mEq/L — ABNORMAL LOW (ref 96–112)
Creatinine, Ser: 0.9 mg/dL (ref 0.50–1.10)
Glucose, Bld: 109 mg/dL — ABNORMAL HIGH (ref 70–99)

## 2010-08-10 LAB — CBC
MCH: 31.4 pg (ref 26.0–34.0)
MCHC: 35.1 g/dL (ref 30.0–36.0)
MCV: 89.5 fL (ref 78.0–100.0)
Platelets: 316 10*3/uL (ref 150–400)
RBC: 4.3 MIL/uL (ref 3.87–5.11)
RDW: 12.8 % (ref 11.5–15.5)

## 2010-08-10 LAB — URINE MICROSCOPIC-ADD ON

## 2010-08-11 LAB — URINE CULTURE

## 2013-09-02 ENCOUNTER — Inpatient Hospital Stay
Admit: 2013-09-02 | Discharge: 2013-09-02 | Disposition: A | Discharge status: Home / Self Care | Attending: Emergency Medicine

## 2013-09-02 LAB — URINALYSIS
Glucose, Ur: NEGATIVE mg/dL
Ketones, Urine: NEGATIVE mg/dL
Nitrite, Urine: NEGATIVE
Protein, UA: 30 mg/dL — AB
Specific Gravity, UA: >=1.03 (ref 1.005–1.030)
Urobilinogen, Urine: 1 E.U./dL (ref <2.0)
pH, UA: 5.5 (ref 5.0–9.0)

## 2013-09-02 LAB — MICROSCOPIC URINALYSIS

## 2013-09-02 MED ORDER — PYRIDIUM 200 MG PO TABS
200 MG | ORAL_TABLET | Freq: Three times a day (TID) | ORAL | Status: AC | PRN
Start: 2013-09-02 — End: 2013-09-04

## 2013-09-02 MED ORDER — CIPRO 500 MG PO TABS
500 MG | ORAL_TABLET | Freq: Two times a day (BID) | ORAL | Status: AC
Start: 2013-09-02 — End: 2013-09-12

## 2013-09-02 NOTE — ED Provider Notes (Signed)
Patient is a 80 y.o. female presenting with dysuria. The history is provided by the patient.   Dysuria   This is a recurrent problem. The current episode started more than 1 week ago. The problem occurs every urination. The quality of the pain is described as burning. Associated symptoms include frequency and urgency. Pertinent negatives include no chills, no nausea, no vomiting, no discharge, no hematuria, no hesitancy and no flank pain.       Review of Systems   Constitutional: Negative for fever and chills.   HENT: Negative for ear pain, sinus pressure and sore throat.    Eyes: Negative for pain, discharge and redness.   Respiratory: Negative for cough, shortness of breath and wheezing.    Cardiovascular: Negative for chest pain.   Gastrointestinal: Negative for nausea, vomiting, diarrhea and abdominal distention.   Genitourinary: Positive for dysuria, urgency and frequency. Negative for hesitancy, hematuria and flank pain.   Musculoskeletal: Negative for back pain and arthralgias.   Skin: Negative for rash and wound.   Neurological: Negative for weakness and headaches.   Hematological: Negative for adenopathy.   All other systems reviewed and are negative.      Physical Exam   Constitutional: She is oriented to person, place, and time. She appears well-developed and well-nourished.   HENT:   Head: Normocephalic and atraumatic.   Right Ear: Hearing and external ear normal.   Left Ear: Hearing and external ear normal.   Nose: Nose normal.   Mouth/Throat: Uvula is midline, oropharynx is clear and moist and mucous membranes are normal.   Eyes: Conjunctivae, EOM and lids are normal. Pupils are equal, round, and reactive to light.   Neck: Normal range of motion. Neck supple.   Cardiovascular: Normal rate, regular rhythm and normal heart sounds.    No murmur heard.  Pulmonary/Chest: Effort normal and breath sounds normal.   Abdominal: Soft. Bowel sounds are normal. There is no tenderness. There is no rigidity, no  rebound, no guarding and no CVA tenderness.   Musculoskeletal: She exhibits no edema.   Neurological: She is alert and oriented to person, place, and time. She has normal strength. No cranial nerve deficit or sensory deficit. Coordination and gait normal. GCS eye subscore is 4. GCS verbal subscore is 5. GCS motor subscore is 6.   Skin: Skin is warm and dry. No abrasion and no rash noted.   Nursing note and vitals reviewed.      Procedures    MDM     --------------------------------------------- PAST HISTORY ---------------------------------------------  Past Medical History:  has a past medical history of Kidney infection; Acid reflux; and Hypertension.    Past Surgical History:  has past surgical history that includes Cholecystectomy; Throat surgery; and back surgery.    Social History:      Family History: family history is not on file.     The patient???s home medications have been reviewed.    Allergies: Review of patient's allergies indicates no known allergies.    -------------------------------------------------- RESULTS -------------------------------------------------  Results for orders placed during the hospital encounter of 09/02/13   URINALYSIS       Result Value Ref Range    Color, UA Yellow  Straw/Yellow    Clarity, UA CLOUDY (*) Clear    Glucose, Ur Negative  Negative mg/dL    Bilirubin Urine SMALL (*) Negative    Ketones, Urine Negative  Negative mg/dL    Specific Gravity, UA >=1.030  1.005 - 1.030    Blood, Urine  TRACE (*) Negative    pH, UA 5.5  5.0 - 9.0    Protein, UA 30 (*) Negative mg/dL    Urobilinogen, Urine 1.0  < 2.0 E.U./dL    Nitrite, Urine Negative  Negative    Leukocyte Esterase, Urine SMALL (*) Negative   MICROSCOPIC URINALYSIS       Result Value Ref Range    WBC, UA 5-10  0 - 5 /HPF    RBC, UA 1-3  0 - 2 /HPF    Epi Cells RARE      Bacteria, UA MODERATE (*)           ------------------------- NURSING NOTES AND VITALS REVIEWED ---------------------------   The nursing notes within the  ED encounter and vital signs as below have been reviewed.   BP 132/77    Pulse 80    Temp(Src) 98 ??F (36.7 ??C) (Oral)    Resp 16    Wt 145 lb (65.772 kg)   Oxygen Saturation Interpretation: Normal      ------------------------------------------ PROGRESS NOTES ------------------------------------------   I have spoken with the patient and discussed today???s results, in addition to providing specific details for the plan of care and counseling regarding the diagnosis and prognosis.  Their questions are answered at this time and they are agreeable with the plan.      --------------------------------- ADDITIONAL PROVIDER NOTES ---------------------------------          This patient is stable for discharge.  I have shared the specific conditions for return, as well as the importance of follow-up.            Delma OfficerAsheesh A Pai Dhungat, MD  09/02/13 [id number]

## 2013-09-05 LAB — CULTURE, URINE
Urine Culture, Routine: >100000
Urine Culture, Routine: >100000

## 2013-12-09 ENCOUNTER — Emergency Department (HOSPITAL_BASED_OUTPATIENT_CLINIC_OR_DEPARTMENT_OTHER)
Admission: EM | Admit: 2013-12-09 | Discharge: 2013-12-09 | Disposition: A | Discharge status: Home / Self Care | Payer: Medicare Other | Attending: Emergency Medicine | Admitting: Emergency Medicine

## 2013-12-09 ENCOUNTER — Encounter (HOSPITAL_BASED_OUTPATIENT_CLINIC_OR_DEPARTMENT_OTHER): Payer: Self-pay | Admitting: Emergency Medicine

## 2013-12-09 DIAGNOSIS — Z0000 Encounter for general adult medical examination without abnormal findings: Secondary | ICD-10-CM | POA: Diagnosis present

## 2013-12-09 DIAGNOSIS — K219 Gastro-esophageal reflux disease without esophagitis: Secondary | ICD-10-CM | POA: Insufficient documentation

## 2013-12-09 DIAGNOSIS — E871 Hypo-osmolality and hyponatremia: Secondary | ICD-10-CM | POA: Diagnosis not present

## 2013-12-09 DIAGNOSIS — I10 Essential (primary) hypertension: Secondary | ICD-10-CM | POA: Insufficient documentation

## 2013-12-09 DIAGNOSIS — Z79899 Other long term (current) drug therapy: Secondary | ICD-10-CM | POA: Insufficient documentation

## 2013-12-09 DIAGNOSIS — E876 Hypokalemia: Secondary | ICD-10-CM | POA: Insufficient documentation

## 2013-12-09 HISTORY — DX: Essential (primary) hypertension: I10

## 2013-12-09 HISTORY — DX: Gastro-esophageal reflux disease without esophagitis: K21.9

## 2013-12-09 LAB — CBC WITH DIFFERENTIAL/PLATELET
BASOS ABS: 0 10*3/uL (ref 0.0–0.1)
Basophils Relative: 0 % (ref 0–1)
EOS PCT: 2 % (ref 0–5)
Eosinophils Absolute: 0.2 10*3/uL (ref 0.0–0.7)
HEMATOCRIT: 40.4 % (ref 36.0–46.0)
HEMOGLOBIN: 14.1 g/dL (ref 12.0–15.0)
LYMPHS PCT: 21 % (ref 12–46)
Lymphs Abs: 2.3 10*3/uL (ref 0.7–4.0)
MCH: 31.5 pg (ref 26.0–34.0)
MCHC: 34.9 g/dL (ref 30.0–36.0)
MCV: 90.4 fL (ref 78.0–100.0)
MONO ABS: 1 10*3/uL (ref 0.1–1.0)
MONOS PCT: 9 % (ref 3–12)
Neutro Abs: 7.6 10*3/uL (ref 1.7–7.7)
Neutrophils Relative %: 68 % (ref 43–77)
Platelets: 302 10*3/uL (ref 150–400)
RBC: 4.47 MIL/uL (ref 3.87–5.11)
RDW: 12.4 % (ref 11.5–15.5)
WBC: 11.2 10*3/uL — AB (ref 4.0–10.5)

## 2013-12-09 LAB — COMPREHENSIVE METABOLIC PANEL
ALT: 11 U/L (ref 0–35)
ANION GAP: 12 (ref 5–15)
AST: 21 U/L (ref 0–37)
Albumin: 3.4 g/dL — ABNORMAL LOW (ref 3.5–5.2)
Alkaline Phosphatase: 92 U/L (ref 39–117)
BILIRUBIN TOTAL: 0.3 mg/dL (ref 0.3–1.2)
BUN: 21 mg/dL (ref 6–23)
CALCIUM: 10 mg/dL (ref 8.4–10.5)
CO2: 31 meq/L (ref 19–32)
CREATININE: 0.9 mg/dL (ref 0.50–1.10)
Chloride: 87 mEq/L — ABNORMAL LOW (ref 96–112)
GFR, EST AFRICAN AMERICAN: 63 mL/min — AB (ref >90)
GFR, EST NON AFRICAN AMERICAN: 54 mL/min — AB (ref >90)
GLUCOSE: 110 mg/dL — AB (ref 70–99)
Potassium: 3.2 mEq/L — ABNORMAL LOW (ref 3.7–5.3)
Sodium: 130 mEq/L — ABNORMAL LOW (ref 137–147)
Total Protein: 7 g/dL (ref 6.0–8.3)

## 2013-12-09 LAB — URINE MICROSCOPIC-ADD ON

## 2013-12-09 LAB — URINALYSIS, ROUTINE W REFLEX MICROSCOPIC
Bilirubin Urine: NEGATIVE
Glucose, UA: NEGATIVE mg/dL
KETONES UR: NEGATIVE mg/dL
Nitrite: NEGATIVE
PROTEIN: NEGATIVE mg/dL
Specific Gravity, Urine: 1.017 (ref 1.005–1.030)
UROBILINOGEN UA: 0.2 mg/dL (ref 0.0–1.0)
pH: 5.5 (ref 5.0–8.0)

## 2013-12-09 LAB — NA AND K (SODIUM & POTASSIUM), RAND UR
Potassium Urine: 55 mEq/L
SODIUM UR: 80 meq/L

## 2013-12-09 MED ORDER — SODIUM CHLORIDE 0.9 % IV BOLUS (SEPSIS): 500.0000 mL | Freq: Once | INTRAVENOUS | Status: DC

## 2013-12-09 MED ORDER — POTASSIUM CHLORIDE CRYS ER 20 MEQ PO TBCR
40.0000 meq | EXTENDED_RELEASE_TABLET | Freq: Once | ORAL | Status: AC
  Administered 2013-12-09: 40 meq via ORAL
  Filled 2013-12-09: qty 2

## 2013-12-09 MED ORDER — POTASSIUM CHLORIDE ER 10 MEQ PO TBCR: 10.0000 meq | EXTENDED_RELEASE_TABLET | Freq: Two times a day (BID) | ORAL | Status: DC

## 2013-12-09 NOTE — Discharge Instructions (Signed)
Hyponatremia  °Hyponatremia is when the amount of salt (sodium) in your blood is too low. When sodium levels are low, your cells will absorb extra water and swell. The swelling happens throughout the body, but it mostly affects the brain. Severe brain swelling (cerebral edema), seizures, or coma can happen.  °CAUSES  °· Heart, kidney, or liver problems. °· Thyroid problems. °· Adrenal gland problems. °· Severe vomiting and diarrhea. °· Certain medicines or illegal drugs. °· Dehydration. °· Drinking too much water. °· Low-sodium diet. °SYMPTOMS  °· Nausea and vomiting. °· Confusion. °· Lethargy. °· Agitation. °· Headache. °· Twitching or shaking (seizures). °· Unconsciousness. °· Appetite loss. °· Muscle weakness and cramping. °DIAGNOSIS  °Hyponatremia is identified by a simple blood test. Your caregiver will perform a history and physical exam to try to find the cause and type of hyponatremia. Other tests may be needed to measure the amount of sodium in your blood and urine. °TREATMENT  °Treatment will depend on the cause.  °· Fluids may be given through the vein (IV). °· Medicines may be used to correct the sodium imbalance. If medicines are causing the problem, they will need to be adjusted. °· Water or fluid intake may be restricted to restore proper balance. °The speed of correcting the sodium problem is very important. If the problem is corrected too fast, nerve damage (sometimes unchangeable) can happen. °HOME CARE INSTRUCTIONS  °· Only take medicines as directed by your caregiver. Many medicines can make hyponatremia worse. Discuss all your medicines with your caregiver. °· Carefully follow any recommended diet, including any fluid restrictions. °· You may be asked to repeat lab tests. Follow these directions. °· Avoid alcohol and recreational drugs. °SEEK MEDICAL CARE IF:  °· You develop worsening nausea, fatigue, headache, confusion, or weakness. °· Your original hyponatremia symptoms return. °· You have  problems following the recommended diet. °SEEK IMMEDIATE MEDICAL CARE IF:  °· You have a seizure. °· You faint. °· You have ongoing diarrhea or vomiting. °MAKE SURE YOU:  °· Understand these instructions. °· Will watch your condition. °· Will get help right away if you are not doing well or get worse. °Document Released: 01/20/2002 Document Revised: 04/24/2011 Document Reviewed: 07/17/2010 °[employer]® Patient Information ©2015 [employer]. This information is not intended to replace advice given to you by your health care provider. Make sure you discuss any questions you have with your health care provider. ° °Hypokalemia °Hypokalemia means that the amount of potassium in the blood is lower than normal. Potassium is a chemical, called an electrolyte, that helps regulate the amount of fluid in the body. It also stimulates muscle contraction and helps nerves function properly. Most of the body's potassium is inside of cells, and only a very small amount is in the blood. Because the amount in the blood is so small, minor changes can be life-threatening. °CAUSES °· Antibiotics. °· Diarrhea or vomiting. °· Using laxatives too much, which can cause diarrhea. °· Chronic kidney disease. °· Water pills (diuretics). °· Eating disorders (bulimia). °· Low magnesium level. °· Sweating a lot. °SIGNS AND SYMPTOMS °· Weakness. °· Constipation. °· Fatigue. °· Muscle cramps. °· Mental confusion. °· Skipped heartbeats or irregular heartbeat (palpitations). °· Tingling or numbness. °DIAGNOSIS  °Your health care provider can diagnose hypokalemia with blood tests. In addition to checking your potassium level, your health care provider may also check other lab tests. °TREATMENT °Hypokalemia can be treated with potassium supplements taken by mouth or adjustments in your current medicines. If your potassium level   is very low, you may need to get potassium through a vein (IV) and be monitored in the hospital. A diet high in potassium is  also helpful. Foods high in potassium are: °· Nuts, such as peanuts and pistachios. °· Seeds, such as sunflower seeds and pumpkin seeds. °· Peas, lentils, and lima beans. °· Whole grain and bran cereals and breads. °· Fresh fruit and vegetables, such as apricots, avocado, bananas, cantaloupe, kiwi, oranges, tomatoes, asparagus, and potatoes. °· Orange and tomato juices. °· Red meats. °· Fruit yogurt. °HOME CARE INSTRUCTIONS °· Take all medicines as prescribed by your health care provider. °· Maintain a healthy diet by including nutritious food, such as fruits, vegetables, nuts, whole grains, and lean meats. °· If you are taking a laxative, be sure to follow the directions on the label. °SEEK MEDICAL CARE IF: °· Your weakness gets worse. °· You feel your heart pounding or racing. °· You are vomiting or having diarrhea. °· You are diabetic and having trouble keeping your blood glucose in the normal range. °SEEK IMMEDIATE MEDICAL CARE IF: °· You have chest pain, shortness of breath, or dizziness. °· You are vomiting or having diarrhea for more than 2 days. °· You faint. °MAKE SURE YOU:  °· Understand these instructions. °· Will watch your condition. °· Will get help right away if you are not doing well or get worse. °Document Released: 01/30/2005 Document Revised: 11/20/2012 Document Reviewed: 08/02/2012 °ExitCare® Patient Information ©2015 [employer]. This information is not intended to replace advice given to you by your health care provider. Make sure you discuss any questions you have with your health care provider. ° °

## 2013-12-09 NOTE — ED Provider Notes (Signed)
CSN: [id number]     Arrival date & time 12/09/13  1910 History  This chart was scribed for Pamella Pert, MD by Molli Posey, ED Scribe. This patient was seen in room MH04/MH04 and the patient's care was started 7:52 PM.    No chief complaint on file.  Patient is a 80 y.o. female presenting with general illness. The history is provided by the patient. No language interpreter was used.  Illness Severity:  Mild Onset quality:  Sudden Context:  Low sodium reported from PCP office  Associated symptoms: no abdominal pain, no chest pain, no congestion, no cough, no diarrhea, no fatigue, no fever, no headaches, no nausea, no rash and no vomiting    HPI Comments: [name] is a 80 y.o. female with a history of HTN who presents to the Emergency Department complaining of low sodium that was 125 earlier when she was at her PCP. Pt states that PCP's office called after her checkup with Dr. Harrington Challenger and said her sodium was low and told her to come to the ED. Daughter reports pt has been having many UTI's recently. She denies fever, nausea and vomiting as associated symptoms.  PCP Harrington Challenger  Past Medical History  Diagnosis Date  . Hypertension   . GERD (gastroesophageal reflux disease)    Past Surgical History  Procedure Laterality Date  . Hernia repair    . Cholecystectomy    . Abdominal hysterectomy     No family history on file. History  Substance Use Topics  . Smoking status: Never Smoker   . Smokeless tobacco: Not on file  . Alcohol Use: No   OB History   Grav Para Term Preterm Abortions TAB SAB Ect Mult Living                 Review of Systems  Constitutional: Negative for fever, appetite change and fatigue.  HENT: Negative for congestion, ear discharge and sinus pressure.   Eyes: Negative for discharge.  Respiratory: Negative for cough.   Cardiovascular: Negative for chest pain.  Gastrointestinal: Negative for nausea, vomiting, abdominal pain and diarrhea.   Genitourinary: Negative for frequency and hematuria.  Musculoskeletal: Negative for back pain.  Skin: Negative for rash.  Neurological: Negative for seizures and headaches.  Psychiatric/Behavioral: Negative for hallucinations.  All other systems reviewed and are negative.   Allergies  Review of patient's allergies indicates no known allergies.  Home Medications   Prior to Admission medications   Medication Sig Start Date End Date Taking? Authorizing Provider  chlorthalidone (HYGROTON) 25 MG tablet Take 25 mg by mouth daily.   Yes Historical Provider, MD  metoprolol succinate (TOPROL-XL) 25 MG 24 hr tablet Take 25 mg by mouth daily.   Yes Historical Provider, MD  pantoprazole (PROTONIX) 20 MG tablet Take 20 mg by mouth daily.   Yes Historical Provider, MD  traMADol (ULTRAM) 50 MG tablet Take by mouth every 6 (six) hours as needed.   Yes Historical Provider, MD   BP 156/70  Pulse 75  Temp(Src) 98.2 F (36.8 C) (Oral)  Resp 20  Ht 5\' 7"  (1.702 m)  Wt 149 lb (67.586 kg)  BMI 23.33 kg/m2  SpO2 97% Physical Exam  Nursing note and vitals reviewed. Constitutional: She is oriented to person, place, and time. She appears well-developed and well-nourished.  HENT:  Head: Normocephalic and atraumatic.  Eyes: Conjunctivae and EOM are normal. Pupils are equal, round, and reactive to light. No scleral icterus.  Neck: Neck supple. No thyromegaly present.  Cardiovascular: Normal rate, regular rhythm and normal heart sounds.  Exam reveals no gallop and no friction rub.   No murmur heard. Pulmonary/Chest: Effort normal and breath sounds normal. No stridor. She has no wheezes. She has no rales. She exhibits no tenderness.  Abdominal: She exhibits no distension. There is no tenderness. There is no rebound.  Musculoskeletal: Normal range of motion. She exhibits no edema.  Lymphadenopathy:    She has no cervical adenopathy.  Neurological: She is alert and oriented to person, place, and time. She  exhibits normal muscle tone. Coordination normal.  Skin: Skin is warm and dry. No rash noted. No erythema.  Psychiatric: She has a normal mood and affect. Her behavior is normal.    ED Course  Procedures  DIAGNOSTIC STUDIES: Oxygen Saturation is 97% on RA, normal by my interpretation.    COORDINATION OF CARE: 7:58 PM Discussed treatment plan with pt at bedside and pt agreed to plan.   Labs Review Labs Reviewed  CBC WITH DIFFERENTIAL - Abnormal; Notable for the following:    WBC 11.2 (*)    All other components within normal limits  COMPREHENSIVE METABOLIC PANEL - Abnormal; Notable for the following:    Sodium 130 (*)    Potassium 3.2 (*)    Chloride 87 (*)    Glucose, Bld 110 (*)    Albumin 3.4 (*)    GFR calc non Af Amer 54 (*)    GFR calc Af Amer 63 (*)    All other components within normal limits  URINALYSIS, ROUTINE W REFLEX MICROSCOPIC - Abnormal; Notable for the following:    APPearance CLOUDY (*)    Hgb urine dipstick SMALL (*)    Leukocytes, UA LARGE (*)    All other components within normal limits  URINE MICROSCOPIC-ADD ON  NA AND K (SODIUM & POTASSIUM), RAND UR    Imaging Review No results found.   EKG Interpretation None      MDM   Final diagnoses:  Hyponatremia  Hypokalemia   9:11 PM 80 y.o. female who presents with report of routine lab work finding of a sodium of 125. The patient saw her PCP this morning and was called by the on-call nurse to come to the ER due to her sodium level. Repeat lab work shows a sodium of 130. I did send a urine sodium for the PCP to follow-up on. She got a 500 mL bolus of normal saline here. She is asymptomatic. Will discharge home.  9:12 PM: Potassium was also a little low, will place on K for a few days. I have discussed the diagnosis/risks/treatment options with the patient and family and believe the pt to be eligible for discharge home to follow-up with her pcp. We also discussed returning to the ED immediately if new  or worsening sx occur. Medications administered to the patient during their visit and any new prescriptions provided to the patient are listed below.  Medications given during this visit Medications  sodium chloride 0.9 % bolus 500 mL (0 mLs Intravenous Stopped 12/09/13 2108)  potassium chloride SA (K-DUR,KLOR-CON) CR tablet 40 mEq (40 mEq Oral Given 12/09/13 2119)    Discharge Medication List as of 12/09/2013  9:13 PM    START taking these medications   Details  potassium chloride (K-DUR) 10 MEQ tablet Take 1 tablet (10 mEq total) by mouth 2 (two) times daily., Starting 12/09/2013, Until Discontinued, Print           I personally performed the services  described in this documentation, which was scribed in my presence. The recorded information has been reviewed and is accurate.      Pamella Pert, MD 12/09/13 [phone]

## 2017-04-12 ENCOUNTER — Other Ambulatory Visit: Payer: Self-pay | Admitting: Family Medicine

## 2017-04-12 DIAGNOSIS — N63 Unspecified lump in unspecified breast: Secondary | ICD-10-CM

## 2017-04-20 ENCOUNTER — Other Ambulatory Visit: Payer: Self-pay | Admitting: Family Medicine

## 2017-04-20 ENCOUNTER — Ambulatory Visit
Admission: RE | Admit: 2017-04-20 | Discharge: 2017-04-20 | Disposition: A | Discharge status: Home / Self Care | Payer: Medicare Other | Source: Ambulatory Visit | Attending: Family Medicine | Admitting: Family Medicine

## 2017-04-20 DIAGNOSIS — N63 Unspecified lump in unspecified breast: Secondary | ICD-10-CM

## 2017-04-20 DIAGNOSIS — N6320 Unspecified lump in the left breast, unspecified quadrant: Secondary | ICD-10-CM

## 2017-04-26 ENCOUNTER — Other Ambulatory Visit: Payer: Self-pay | Admitting: Family Medicine

## 2017-04-26 ENCOUNTER — Ambulatory Visit
Admission: RE | Admit: 2017-04-26 | Discharge: 2017-04-26 | Disposition: A | Discharge status: Home / Self Care | Payer: Medicare Other | Source: Ambulatory Visit | Attending: Family Medicine | Admitting: Family Medicine

## 2017-04-26 DIAGNOSIS — N6320 Unspecified lump in the left breast, unspecified quadrant: Secondary | ICD-10-CM

## 2017-05-08 ENCOUNTER — Encounter: Payer: Self-pay | Admitting: Radiation Oncology

## 2017-05-08 ENCOUNTER — Telehealth: Payer: Self-pay | Admitting: Oncology

## 2017-05-08 NOTE — Telephone Encounter (Signed)
Pt has been scheduled for the pt to see Dr. Jana Hakim on 4/10 at 4pm. Left the appt date and time on her daughter's vm.

## 2017-05-09 ENCOUNTER — Telehealth: Payer: Self-pay | Admitting: Oncology

## 2017-05-09 ENCOUNTER — Encounter: Payer: Self-pay | Admitting: Oncology

## 2017-05-09 NOTE — Telephone Encounter (Signed)
Appt date and time for the pt to see Dr. Jana Hakim has been confirmed by the pt's daughter

## 2017-05-17 NOTE — Progress Notes (Signed)
Location of Breast Cancer: Left Breast  Histology per Pathology Report:  04/26/17 Diagnosis Breast, left, needle core biopsy, upper outer quadrant, axillary tail at 2 o'clock - HIGH GRADE CARCINOMA  Receptor Status: ER(50%), PR (NEG), Her2-neu (NEG), Ki-(80%)  Did patient present with symptoms or was this found on screening mammography?: She felt some itching in her left axilla and felt a mass in her left breast around Halloween 2018. There was some delay in her seeing her PCP  Past/Anticipated interventions by surgeon, if any: Dr. [name].   Past/Anticipated interventions by medical oncology, if any: Dr. Jana Hakim 05/23/17 at 4:00  Lymphedema issues, if any:  N/A  Pain issues, if any:  She reports pain to her arms and legs related to arthritis.   SAFETY ISSUES:  Prior radiation? No  Pacemaker/ICD? No  Possible current pregnancy? No  Is the patient on methotrexate? No  Current Complaints / other details:    BP (!) 153/77   Pulse 82   Temp 98.1 F (36.7 C)   Resp 18   Ht _0  (1.702 m)   Wt 149 lb 9.6 oz (67.9 kg)   SpO2 97% Comment: room air  BMI 23.43 kg/m    Wt Readings from Last 3 Encounters:  05/23/17 149 lb 9.6 oz (67.9 kg)  12/09/13 149 lb (67.6 kg)      [name], Stephani Police, RN 05/17/2017,1:45 PM

## 2017-05-22 NOTE — Progress Notes (Signed)
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  More recently she noted a palpable mass in the axillary tail of her left breast.  She was referred for bilateral diagnostic mammography with tomography and left breast ultrasonography at the breast center 04/20/2017.  The breast density was category B.  In the axillary tail of the left breast there was a lobulated mass with no other findings of concern in either breast.  The mass was palpable and easily movable.  Ultrasonography confirmed a hypoechoic mass measuring 2.8 cm which was both solid and cystic.  Ultrasound of the left axilla was negative.  On 04/26/2017 the patient underwent biopsy of the left breast mass in question.  This showed ([id number]) high-grade carcinoma with squamous differentiation.  The tumor was estrogen receptor 50% positive, with strong staining intensity, progesterone receptor negative, with an MIB-1 of 80% and no HER-2 amplification, the signals ratio being 0.79 and the number per cell 2.85.  The patient's subsequent history is as detailed below.  INTERVAL HISTORY: [name] was evaluated in the breast cancer clinic on 05/23/17 accompanied by her daughter, [name]. Her case was also presented at the multidisciplinary breast cancer conference 05/09/2016. At that time a preliminary plan was proposed: Excisional biopsy, and hormones.   Adjuvant radiation could also be considered depending on the surgical results.   REVIEW OF SYSTEMS: [name] is doing well. She is house bound. She likes to read, watch tv, and do puzzles. She has not fallen in the last year. She uses a walker al the time. The patient denies unusual headaches, visual changes, nausea, vomiting, stiff neck, dizziness, or gait imbalance. There has been no cough, phlegm production, or pleurisy, no chest pain or pressure, and no change in bowel or bladder habits. The patient denies fever, rash, bleeding, unexplained fatigue or unexplained weight loss. A detailed review of systems was otherwise entirely negative.    PAST MEDICAL HISTORY: Past Medical History:  Diagnosis Date  . Arthritis   . GERD (gastroesophageal reflux disease)   . Hypertension     PAST SURGICAL HISTORY: Past Surgical History:  Procedure Laterality Date  . ABDOMINAL HYSTERECTOMY    . CHOLECYSTECTOMY    . HERNIA REPAIR      FAMILY HISTORY No family history on file.  Her father died at 55 from pneumonia. Her mother died at 98. The patient had four brothers and 2 sisters. One sister is still alive.   GYNECOLOGIC HISTORY:  No LMP recorded. Patient is postmenopausal. Menarche: 84 years old Age at first live birth: 84 years old [place] P 1 LMP unsure Contraceptive no HRT: no  Hysterectomy? no SO? no  SOCIAL HISTORY:  She is retired from various jobs, mostly in [place] and [employer].  She also worked briefly for the post office.  Her husband died from cancer and was treated in this center..  The patient's only child, her daughter [name], lives with her.  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  She has a different routes.  The patient has three grandchildren and 5 great-grandchildren. She belongs to [place] [employer] [place].     ADVANCED DIRECTIVES: Need to be addressed   HEALTH MAINTENANCE: Social  History   Tobacco Use  . Smoking status: Never Smoker  . Smokeless tobacco: Never Used  Substance Use Topics  . Alcohol use: No  . Drug use: No     Colonoscopy:  PAP:  Bone density:   Allergies  Allergen Reactions  . Codeine Nausea And Vomiting    Current Outpatient Medications  Medication Sig Dispense Refill  . aspirin 325 MG EC tablet Take 325 mg by mouth daily.    . chlorthalidone (HYGROTON) 25 MG tablet Take 25 mg by mouth daily.    . metoprolol succinate (TOPROL-XL) 25 MG 24 hr tablet Take 25 mg by mouth daily.    . pantoprazole (PROTONIX) 20 MG tablet Take 20 mg by mouth daily.    . traMADol (ULTRAM) 50 MG tablet Take by mouth every 6 (six) hours as needed.    Marland Kitchen amitriptyline (ELAVIL) 25 MG tablet   0   No current facility-administered medications for this visit.     OBJECTIVE: Elderly white woman who appears stated age  84:   05/23/17 1553  BP: 132/78  Pulse: 90  Resp: 18  Temp: 98.1 F (36.7 C)  SpO2: 97%     Body mass index is 23.38 kg/m.   Wt Readings from Last 3 Encounters:  05/23/17 149 lb 4.8 oz (67.7 kg)  05/23/17 149 lb 9.6 oz (67.9 kg)  12/09/13 149 lb (67.6 kg)      ECOG FS:2 - Symptomatic, <50% confined to bed  Ocular: Sclerae unicteric, pupils round and equal Lymphatic: No cervical or supraclavicular adenopathy Lungs no rales or rhonchi Heart regular rate and rhythm Abd soft, nontender, positive bowel sounds MSK kyphosis but no focal spinal tenderness, no joint edema Neuro: non-focal, well-oriented, appropriate affect Breasts: The right breast is unremarkable.  The mass in the tail of [place] on the left measures approximately 2 cm and is easily movable.  There is no tenderness or erythema.  The breast itself on the left is not remarkable.  Both axillae are benign   LAB RESULTS:  CMP     Component Value Date/Time   NA 133 (L) 05/23/2017 1544   K 3.3 (L) 05/23/2017 1544   CL 91 (L) 05/23/2017 1544   CO2 31 (H) 05/23/2017 1544     GLUCOSE 110 05/23/2017 1544   BUN 19 05/23/2017 1544   CREATININE 1.31 (H) 05/23/2017 1544   CALCIUM 10.2 05/23/2017 1544   PROT 7.1 05/23/2017 1544   ALBUMIN 3.6 05/23/2017 1544   AST 20 05/23/2017 1544   ALT 13 05/23/2017 1544   ALKPHOS 78 05/23/2017 1544   BILITOT 0.4 05/23/2017 1544   GFRNONAA 34 (L) 05/23/2017 1544   GFRAA 39 (L) 05/23/2017 1544    No results found for: TOTALPROTELP, ALBUMINELP, A1GS, A2GS, BETS, BETA2SER, GAMS, MSPIKE, SPEI  No results found for: KPAFRELGTCHN, LAMBDASER, KAPLAMBRATIO  Lab Results  Component Value Date   WBC 8.0 05/23/2017   NEUTROABS 4.5 05/23/2017   HGB 14.1 12/09/2013   HCT 41.0 05/23/2017   MCV 91.9 05/23/2017   PLT 330 05/23/2017    @LASTCHEMISTRY @  No results found for: LABCA2  No components found for: [id number]  No results for input(s): INR in  the last 168 hours.  No results found for: LABCA2  No results found for: WKM628  No results found for: MNO177  No results found for: NHA579  No results found for: CA2729  No components found for: HGQUANT  No results found for: CEA1 / No results found for: CEA1   No results found for: AFPTUMOR  No results found for: CHROMOGRNA  No results found for: PSA1  Appointment on 05/23/2017  Component Date Value Ref Range Status  . WBC Count 05/23/2017 8.0  3.9 - 10.3 K/uL Final  . RBC 05/23/2017 4.46  3.70 - 5.45 MIL/uL Final  . Hemoglobin 05/23/2017 13.9  11.6 - 15.9 g/dL Final  . HCT 05/23/2017 41.0  34.8 - 46.6 % Final  . MCV 05/23/2017 91.9  79.5 - 101.0 fL Final  . MCH 05/23/2017 31.1  25.1 - 34.0 pg Final  . MCHC 05/23/2017 33.8  31.5 - 36.0 g/dL Final  . RDW 05/23/2017 14.4  11.2 - 14.5 % Final  . Platelet Count 05/23/2017 330  145 - 400 K/uL Final  . Neutrophils Relative % 05/23/2017 55  % Final  . Neutro Abs 05/23/2017 4.5  1.5 - 6.5 K/uL Final  . Lymphocytes Relative 05/23/2017 32  % Final  . Lymphs Abs 05/23/2017 2.6  0.9 - 3.3 K/uL Final  . Monocytes  Relative 05/23/2017 10  % Final  . Monocytes Absolute 05/23/2017 0.8  0.1 - 0.9 K/uL Final  . Eosinophils Relative 05/23/2017 2  % Final  . Eosinophils Absolute 05/23/2017 0.2  0.0 - 0.5 K/uL Final  . Basophils Relative 05/23/2017 1  % Final  . Basophils Absolute 05/23/2017 0.0  0.0 - 0.1 K/uL Final   Performed at [facility], [place] [address], [place], [place] [address]  . Sodium 05/23/2017 133* 136 - 145 mmol/L Final  . Potassium 05/23/2017 3.3* 3.5 - 5.1 mmol/L Final  . Chloride 05/23/2017 91* 98 - 109 mmol/L Final  . CO2 05/23/2017 31* 22 - 29 mmol/L Final  . Glucose, Bld 05/23/2017 110  70 - 140 mg/dL Final  . BUN 05/23/2017 19  7 - 26 mg/dL Final  . Creatinine 05/23/2017 1.31* 0.60 - 1.10 mg/dL Final  . Calcium 05/23/2017 10.2  8.4 - 10.4 mg/dL Final  . Total Protein 05/23/2017 7.1  6.4 - 8.3 g/dL Final  . Albumin 05/23/2017 3.6  3.5 - 5.0 g/dL Final  . AST 05/23/2017 20  5 - 34 U/L Final  . ALT 05/23/2017 13  0 - 55 U/L Final  . Alkaline Phosphatase 05/23/2017 78  40 - 150 U/L Final  . Total Bilirubin 05/23/2017 0.4  0.2 - 1.2 mg/dL Final  . GFR, Est Non Af Am 05/23/2017 34* >60 mL/min Final  . GFR, Est AFR Am 05/23/2017 39* >60 mL/min Final   Comment: (NOTE) The eGFR has been calculated using the CKD EPI equation. This calculation has not been validated in all clinical situations. eGFR's persistently <60 mL/min signify possible Chronic Kidney Disease.   Georgiann Hahn gap 05/23/2017 11  3 - 11 Final   Performed at [facility], [place] [address] [place], [place] [address]    (this displays the last labs from the last 3 days)  No results found for: TOTALPROTELP, ALBUMINELP, A1GS, A2GS, BETS, BETA2SER, GAMS, MSPIKE, SPEI (this displays SPEP labs)  No results found for: KPAFRELGTCHN, LAMBDASER, KAPLAMBRATIO (kappa/lambda light chains)  No results found for: HGBA, HGBA2QUANT, HGBFQUANT, HGBSQUAN (Hemoglobinopathy evaluation)   No  results found for: LDH  No results found for: IRON, TIBC, IRONPCTSAT (Iron and TIBC)  No results found for: FERRITIN  Urinalysis    Component Value Date/Time   COLORURINE YELLOW 12/09/2013 2007   APPEARANCEUR CLOUDY (A) 12/09/2013 2007   LABSPEC 1.017 12/09/2013 2007   PHURINE 5.5 12/09/2013 2007   GLUCOSEU NEGATIVE 12/09/2013 2007   HGBUR SMALL (A) 12/09/2013 2007   BILIRUBINUR NEGATIVE 12/09/2013 2007   KETONESUR NEGATIVE 12/09/2013 2007   PROTEINUR NEGATIVE 12/09/2013 2007   UROBILINOGEN 0.2 12/09/2013 2007   NITRITE NEGATIVE 12/09/2013 2007   LEUKOCYTESUR LARGE (A) 12/09/2013 2007     STUDIES: Mm Clip Placement Left  Result Date: 04/26/2017 CLINICAL DATA:  Confirmation of clip placement after ultrasound-guided core needle biopsy of a mass in the axillary tail of the left breast. EXAM: DIAGNOSTIC LEFT MAMMOGRAM POST ULTRASOUND BIOPSY COMPARISON:  Previous exam(s). FINDINGS: Mammographic images were obtained following ultrasound guided biopsy of a mass involving the upper outer quadrant of the left breast in the axillary tail. The ribbon shaped tissue marker clip is appropriately positioned within the biopsied mass. Expected post biopsy changes are present without evidence of hematoma. IMPRESSION: Appropriate positioning of the ribbon shaped tissue marker clip within the biopsied mass in the axillary tail of the left breast. Final Assessment: Post Procedure Mammograms for Marker Placement Electronically Signed   By: Evangeline Dakin M.D.   On: 04/26/2017 13:45   [place] Lt Breast Bx W Loc Dev 1st Lesion Img Bx Spec US Guide  Addendum Date: 04/30/2017   ADDENDUM REPORT: 04/30/2017 14:39 ADDENDUM: Pathology revealed HIGH GRADE CARCINOMA of the LEFT upper outer quadrant, axillary tail at 2 o'clock. The overall morphology and immunophenotype suggests squamous differentiation. The differential includes metastatic squamous cell carcinoma involving a lymph node and metaplastic carcinoma of the  breast. This was found to be concordant by Dr. Peggye Fothergill. Pathology results were discussed with the patient's daughter, [name] by telephone, per patient request. Ms. Hassell Done reported her mother did well after the biopsy with tenderness at the site. Post biopsy instructions and care were reviewed and questions were answered. The patient's daughter was encouraged to call [facility] for any additional concerns. Surgical consultation has been arranged with Dr. [name] at [facility] on May 03, 2017. Pathology results reported by Terie Purser, RN on 04/30/2017. Electronically Signed   By: Evangeline Dakin M.D.   On: 04/30/2017 14:39   Result Date: 04/30/2017 CLINICAL DATA:  84 year old with a palpable lump in the axillary tail of the left breast, shown on recent diagnostic imaging to represent an approximate 2.2 cm suspicious mass at the 2 o'clock location in the axillary tail. The mass has a cystic component. EXAM: ULTRASOUND GUIDED LEFT BREAST CORE NEEDLE BIOPSY COMPARISON:  Previous exam(s). FINDINGS: I met with the patient and we discussed the procedure of ultrasound-guided biopsy, including benefits and alternatives. We discussed the high likelihood of a successful procedure. We discussed the risks of the procedure, including infection, bleeding, tissue injury, clip migration, and inadequate sampling. Informed written consent was given. The usual time-out protocol was performed immediately prior to the procedure. Lesion quadrant: Upper outer quadrant. Using sterile technique with chlorhexidine as skin antisepsis, 1% lidocaine and 1% lidocaine with epinephrine as local anesthetic, under direct ultrasound visualization, a 12 gauge Bard Marquee core needle device was used to perform biopsy of the mass in the axillary tail of the left breast using a lateral approach. At the conclusion of  the procedure a ribbon shaped tissue marker clip was deployed into the  biopsy cavity. Follow up 2 view mammogram was performed and dictated separately. IMPRESSION: Ultrasound guided biopsy of a mass involving the axillary tail of the left breast. No apparent complications. Electronically Signed: By: Evangeline Dakin M.D. On: 04/26/2017 13:35    ELIGIBLE FOR AVAILABLE RESEARCH PROTOCOL: no  ASSESSMENT: 84 y.o. 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 We first reviewed the fact that cancer is not one disease but more than 100 different diseases and that it is important to keep them separate-- otherwise when friends and relatives discuss their own cancer experiences with Jaya confusion can result. Similarly we explained that if breast cancer spreads to the bone or liver, the patient would not have bone cancer or liver cancer, but breast cancer in the bone and breast cancer in the liver: one cancer in three places-- not 3 different cancers which otherwise would have to be treated in 3 different ways.  We also discussed the fact that this is not unusual, metaplastic breast cancer, that these tumors are aggressive and fast growing, and that they frequently do not respond very well to antiestrogens although they do respond to chemotherapy.  We discussed the difference between local and systemic therapy. In terms of loco-regional treatment, lumpectomy plus radiation is equivalent to mastectomy as far as survival is concerned.  In very elderly patients were chemotherapy is not a consideration, I would favor avoiding sentinel lymph node sampling.  The  patient may well benefit from adjuvant radiation as local control is really the main goal of treatment in this case.  She has already met with Dr. Lanell Persons to discuss that.  We then turned to the rationale for systemic therapy. There is some risk that this cancer may have already spread to other parts of her body. Patients frequently ask at this point about bone scans, CAT scans and PET scans to find out if they have occult breast cancer somewhere else. The problem is that in early stage disease we are much more likely to find false positives then true cancers and this would expose the patient to unnecessary procedures as well as unnecessary radiation. Scans cannot answer the question the patient really would like to know, which is whether she has microscopic disease elsewhere in her body. For those reasons we do not recommend them.  Of course we would proceed to aggressive evaluation of any symptoms that might suggest metastatic disease, but that is not the case here.  Next we went over the options for systemic therapy which are anti-estrogens, anti-HER-2 immunotherapy, and chemotherapy. Priscille does not meet criteria for anti-HER-2 immunotherapy.  Chemotherapy is likely to be effective against her cancer, but I am not sure this patient could tolerate the simplest effective treatments available.  Accordingly as far as systemic therapy is concerned we are going to concentrate on antiestrogens.  Today we specifically discussed anastrozole.  They are well-informed regarding the possible toxicities, side effects and complications of this agent.  I have gone ahead and started her on this.  I also suggested that they increase vitamin D  to 1000 units daily and I will obtain a bone density before she returns to see me since worsening bone density is the side effect of most concern with anastrozole in this case.  In short I would propose starting with lumpectomy, and considering adjuvant radiation, while also  starting anastrozole today.  At the next visit we will discuss advanced directives.  That will be approximately 3 months from now.  They know to call for any problems that may develop before that visit.   Magrinat, Virgie Dad, MD  05/23/17 5:02 PM Medical Oncology and Hematology [facility] [address] [place], [place] [address] Tel. [phone]    Fax. [phone]  This document serves as a record of services personally performed by Chauncey Cruel, MD. It was created on his behalf by Margit Banda, a trained medical scribe. The creation of this record is based on the scribe's personal observations and the provider's statements to them.   I have reviewed the above documentation for accuracy and completeness, and I agree with the above.

## 2017-05-23 ENCOUNTER — Inpatient Hospital Stay: Payer: Medicare Other | Attending: Oncology | Admitting: Oncology

## 2017-05-23 ENCOUNTER — Ambulatory Visit
Admission: RE | Admit: 2017-05-23 | Discharge: 2017-05-23 | Disposition: A | Discharge status: Home / Self Care | Payer: Medicare Other | Source: Ambulatory Visit | Attending: Radiation Oncology | Admitting: Radiation Oncology

## 2017-05-23 ENCOUNTER — Inpatient Hospital Stay: Payer: Medicare Other

## 2017-05-23 ENCOUNTER — Other Ambulatory Visit: Payer: Self-pay | Admitting: *Deleted

## 2017-05-23 ENCOUNTER — Encounter: Payer: Self-pay | Admitting: Radiation Oncology

## 2017-05-23 ENCOUNTER — Other Ambulatory Visit: Payer: Self-pay

## 2017-05-23 VITALS — BP 153/77 | HR 82 | Temp 98.1°F | Resp 18 | Ht 67.0 in | Wt 149.6 lb

## 2017-05-23 DIAGNOSIS — C8580 Other specified types of non-Hodgkin lymphoma, unspecified site: Secondary | ICD-10-CM | POA: Diagnosis not present

## 2017-05-23 DIAGNOSIS — Z170 Estrogen receptor positive status [ER+]: Secondary | ICD-10-CM | POA: Diagnosis not present

## 2017-05-23 DIAGNOSIS — C50411 Malignant neoplasm of upper-outer quadrant of right female breast: Secondary | ICD-10-CM | POA: Diagnosis not present

## 2017-05-23 DIAGNOSIS — M1990 Unspecified osteoarthritis, unspecified site: Secondary | ICD-10-CM | POA: Insufficient documentation

## 2017-05-23 DIAGNOSIS — K219 Gastro-esophageal reflux disease without esophagitis: Secondary | ICD-10-CM | POA: Insufficient documentation

## 2017-05-23 DIAGNOSIS — C50612 Malignant neoplasm of axillary tail of left female breast: Secondary | ICD-10-CM

## 2017-05-23 DIAGNOSIS — Z79899 Other long term (current) drug therapy: Secondary | ICD-10-CM | POA: Insufficient documentation

## 2017-05-23 DIAGNOSIS — Z7982 Long term (current) use of aspirin: Secondary | ICD-10-CM | POA: Diagnosis not present

## 2017-05-23 DIAGNOSIS — I10 Essential (primary) hypertension: Secondary | ICD-10-CM | POA: Diagnosis not present

## 2017-05-23 HISTORY — DX: Unspecified osteoarthritis, unspecified site: M19.90

## 2017-05-23 LAB — CMP (CANCER CENTER ONLY)
ALT: 13 U/L (ref 0–55)
AST: 20 U/L (ref 5–34)
Albumin: 3.6 g/dL (ref 3.5–5.0)
Alkaline Phosphatase: 78 U/L (ref 40–150)
Anion gap: 11 (ref 3–11)
BUN: 19 mg/dL (ref 7–26)
CHLORIDE: 91 mmol/L — AB (ref 98–109)
CO2: 31 mmol/L — ABNORMAL HIGH (ref 22–29)
Calcium: 10.2 mg/dL (ref 8.4–10.4)
Creatinine: 1.31 mg/dL — ABNORMAL HIGH (ref 0.60–1.10)
GFR, EST AFRICAN AMERICAN: 39 mL/min — AB (ref >60)
GFR, EST NON AFRICAN AMERICAN: 34 mL/min — AB (ref >60)
Glucose, Bld: 110 mg/dL (ref 70–140)
POTASSIUM: 3.3 mmol/L — AB (ref 3.5–5.1)
Sodium: 133 mmol/L — ABNORMAL LOW (ref 136–145)
Total Bilirubin: 0.4 mg/dL (ref 0.2–1.2)
Total Protein: 7.1 g/dL (ref 6.4–8.3)

## 2017-05-23 LAB — CBC WITH DIFFERENTIAL (CANCER CENTER ONLY)
BASOS ABS: 0 10*3/uL (ref 0.0–0.1)
Basophils Relative: 1 %
EOS ABS: 0.2 10*3/uL (ref 0.0–0.5)
EOS PCT: 2 %
HCT: 41 % (ref 34.8–46.6)
Hemoglobin: 13.9 g/dL (ref 11.6–15.9)
LYMPHS PCT: 32 %
Lymphs Abs: 2.6 10*3/uL (ref 0.9–3.3)
MCH: 31.1 pg (ref 25.1–34.0)
MCHC: 33.8 g/dL (ref 31.5–36.0)
MCV: 91.9 fL (ref 79.5–101.0)
Monocytes Absolute: 0.8 10*3/uL (ref 0.1–0.9)
Monocytes Relative: 10 %
Neutro Abs: 4.5 10*3/uL (ref 1.5–6.5)
Neutrophils Relative %: 55 %
PLATELETS: 330 10*3/uL (ref 145–400)
RBC: 4.46 MIL/uL (ref 3.70–5.45)
RDW: 14.4 % (ref 11.2–14.5)
WBC: 8 10*3/uL (ref 3.9–10.3)

## 2017-05-23 MED ORDER — ANASTROZOLE 1 MG PO TABS: 1.0000 mg | ORAL_TABLET | Freq: Every day | ORAL | 4 refills | Status: DC

## 2017-05-23 NOTE — Progress Notes (Signed)
Radiation Oncology         [phone] ________________________________  Initial Outpatient Consultation  Name: [name] MRN: [id number]  Date: 05/23/2017  DOB: May 04, 1920  FK:CLEX, Debra Luo, MD  [name], MD   REFERRING PHYSICIAN: [name], MD  DIAGNOSIS:    ICD-10-CM   1. 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 She has not had systemic staging scans   CHIEF COMPLAINT: Here to discuss management of left breast cancer  HISTORY OF PRESENT ILLNESS::[name] is a 84 y.o. female who self-palpated a mass in the axillary tail of the left breast around October 2018. This area was itchy. The patient delayed in reaching out to her PCP. Diagnostic mammogram on 04/20/17 showed a 2.2 cm palpable mass in the axillary tail of the left breast with internal vascular flow is highly suspicious for malignancy. While this is favored to be a high-grade carcinoma of the breast, a pathologic lymph node is also a consideration. There were no other suspicious lymph nodes.Of note, the patient has not had a mammogram in greater than 10 years and there were no priors for comparison.   Biopsy on 04/26/17 showed high grade carcinoma of the left upper outer quadrant with characteristics as described above in the diagnosis.  Receptor status is ER 50%, PR negative, Her-2 negative, and Ki67 80%. The morphology suggests a squamous differentiation. Metastatic squamous cell carcinoma to a lymph node versus metaplasmic carcinoma to the breast are considerations in the differential. The patient presents with her daughter today for evaluation of radiotherapy to the left breast as part of her treatment plan.   The patient reports pain to her arms and legs related to arthritis. She denies lymphedema  at this time. She reports fatigue at this time.  Of note, the patient reports skin cancer to the left eyelid area. She is unable to recall the pathology. She reports it was removed about 5 years ago but she is unsure of the date or location of surgery. She believes it was removed in a location [place] of [address] and she is unable to recall her Psychologist, sport and exercise. She also notes previous skin cancer to the left lateral thigh.    PREVIOUS RADIATION THERAPY: No  PAST MEDICAL HISTORY:  has a past medical history of Arthritis, GERD (gastroesophageal reflux disease), and Hypertension.    PAST SURGICAL HISTORY: Past Surgical History:  Procedure Laterality Date  . ABDOMINAL HYSTERECTOMY    . CHOLECYSTECTOMY    . HERNIA REPAIR      FAMILY HISTORY: family history is not on file.  SOCIAL HISTORY:  reports that she has never smoked. She has never used smokeless tobacco. She reports that she does not drink alcohol or use drugs. The patient lives with her daughter.  ALLERGIES: Codeine  MEDICATIONS:  Current Outpatient Medications  Medication Sig Dispense Refill  . aspirin 325 MG EC tablet Take 325 mg by mouth daily.    . chlorthalidone (HYGROTON) 25 MG tablet Take 25 mg by mouth daily.    . metoprolol succinate (TOPROL-XL) 25 MG 24 hr tablet Take 25 mg by mouth daily.    . pantoprazole (PROTONIX) 20 MG tablet Take 20 mg by mouth daily.    Marland Kitchen amitriptyline (ELAVIL) 25 MG tablet   0  . anastrozole (ARIMIDEX) 1  MG tablet Take 1 tablet (1 mg total) by mouth daily. 90 tablet 4  . cholecalciferol (VITAMIN D) 1000 units tablet Take 1 tablet (1,000 Units total) by mouth daily.    . traMADol (ULTRAM) 50 MG tablet Take by mouth every 6 (six) hours as needed.     No current facility-administered medications for this encounter.     REVIEW OF SYSTEMS: as above.   PHYSICAL EXAM:  height is 5' 7"  (1.702 m) and weight is 149 lb 9.6 oz (67.9 kg). Her temperature is 98.1 F (36.7 C). Her blood pressure is 153/77  (abnormal) and her pulse is 82. Her respiration is 18 and oxygen saturation is 97%.   General: in no acute distress HEENT: Head is normocephalic. Extraocular movements are intact. Oropharynx is clear. Neck: Neck is supple, no palpable cervical or supraclavicular lymphadenopathy.  Heart: Regular in rate and rhythm, no rubs, or gallops. Systolic murmur throughout the precordium.  Chest: Clear to auscultation bilaterally, with no rhonchi, wheezes, or rales. Abdomen: Soft, nontender, nondistended, with no rigidity or guarding. Extremities: No cyanosis. Some edema in bilateral ankles. No epitrochlear lymph nodes palpated. Lymphatics: see Neck Exam Skin:  Scar to the left lateral thigh from skin cancer removal. Signs of sun damage over the skin. Scar above left eye. Musculoskeletal: symmetric strength and muscle tone throughout. Neurologic: Object recall is 0/3 at 3 minutes. She is oriented to person, place, and time. Psychiatric:  Pleasant to speak with, alert. Affect is appropriate. Breasts: Mass in the upper outer quadrant of the left breast/axilla. The mass is mobile. It is approximately 2 cm in dimension. No other palpable masses appreciated in the breasts or axillae. Scar noted in the axilla of the right breast. Patient is unsure of the origin of the scar.    ECOG = 3  0 - Asymptomatic (Fully active, able to carry on all predisease activities without restriction)  1 - Symptomatic but completely ambulatory (Restricted in physically strenuous activity but ambulatory and able to carry out work of a light or sedentary nature. For example, light housework, office work)  2 - Symptomatic, <50% in bed during the day (Ambulatory and capable of all self care but unable to carry out any work activities. Up and about more than 50% of waking hours)  3 - Symptomatic, >50% in bed, but not bedbound (Capable of only limited self-care, confined to bed or chair 50% or more of waking hours)  4 - Bedbound  (Completely disabled. Cannot carry on any self-care. Totally confined to bed or chair)  5 - Death   Eustace Pen MM, Creech RH, Tormey DC, et al. [phone]). "Toxicity and response criteria of the [facility]". [place] Oncol. 5 (6): 649-55   LABORATORY DATA:  Lab Results  Component Value Date   WBC 8.0 05/23/2017   HGB 14.1 12/09/2013   HCT 41.0 05/23/2017   MCV 91.9 05/23/2017   PLT 330 05/23/2017   CMP     Component Value Date/Time   NA 133 (L) 05/23/2017 1544   K 3.3 (L) 05/23/2017 1544   CL 91 (L) 05/23/2017 1544   CO2 31 (H) 05/23/2017 1544   GLUCOSE 110 05/23/2017 1544   BUN 19 05/23/2017 1544   CREATININE 1.31 (H) 05/23/2017 1544   CALCIUM 10.2 05/23/2017 1544   PROT 7.1 05/23/2017 1544   ALBUMIN 3.6 05/23/2017 1544   AST 20 05/23/2017 1544   ALT 13 05/23/2017 1544   ALKPHOS 78 05/23/2017 1544   BILITOT  0.4 05/23/2017 1544   GFRNONAA 34 (L) 05/23/2017 1544   GFRAA 39 (L) 05/23/2017 1544         RADIOGRAPHY: Mm Clip Placement Left  Result Date: 04/26/2017 CLINICAL DATA:  Confirmation of clip placement after ultrasound-guided core needle biopsy of a mass in the axillary tail of the left breast. EXAM: DIAGNOSTIC LEFT MAMMOGRAM POST ULTRASOUND BIOPSY COMPARISON:  Previous exam(s). FINDINGS: Mammographic images were obtained following ultrasound guided biopsy of a mass involving the upper outer quadrant of the left breast in the axillary tail. The ribbon shaped tissue marker clip is appropriately positioned within the biopsied mass. Expected post biopsy changes are present without evidence of hematoma. IMPRESSION: Appropriate positioning of the ribbon shaped tissue marker clip within the biopsied mass in the axillary tail of the left breast. Final Assessment: Post Procedure Mammograms for Marker Placement Electronically Signed   By: Evangeline Dakin M.D.   On: 04/26/2017 13:45   [place] Lt Breast Bx W Loc Dev 1st Lesion Img Bx Spec US Guide  Addendum Date:  04/30/2017   ADDENDUM REPORT: 04/30/2017 14:39 ADDENDUM: Pathology revealed HIGH GRADE CARCINOMA of the LEFT upper outer quadrant, axillary tail at 2 o'clock. The overall morphology and immunophenotype suggests squamous differentiation. The differential includes metastatic squamous cell carcinoma involving a lymph node and metaplastic carcinoma of the breast. This was found to be concordant by Dr. Peggye Fothergill. Pathology results were discussed with the patient's daughter, Earlie Lou by telephone, per patient request. Ms. Hassell Done reported her mother did well after the biopsy with tenderness at the site. Post biopsy instructions and care were reviewed and questions were answered. The patient's daughter was encouraged to call [facility] for any additional concerns. Surgical consultation has been arranged with Dr. [name] at [facility] on May 03, 2017. Pathology results reported by Terie Purser, RN on 04/30/2017. Electronically Signed   By: Evangeline Dakin M.D.   On: 04/30/2017 14:39   Result Date: 04/30/2017 CLINICAL DATA:  84 year old with a palpable lump in the axillary tail of the left breast, shown on recent diagnostic imaging to represent an approximate 2.2 cm suspicious mass at the 2 o'clock location in the axillary tail. The mass has a cystic component. EXAM: ULTRASOUND GUIDED LEFT BREAST CORE NEEDLE BIOPSY COMPARISON:  Previous exam(s). FINDINGS: I met with the patient and we discussed the procedure of ultrasound-guided biopsy, including benefits and alternatives. We discussed the high likelihood of a successful procedure. We discussed the risks of the procedure, including infection, bleeding, tissue injury, clip migration, and inadequate sampling. Informed written consent was given. The usual time-out protocol was performed immediately prior to the procedure. Lesion quadrant: Upper outer quadrant. Using sterile technique with chlorhexidine as skin  antisepsis, 1% lidocaine and 1% lidocaine with epinephrine as local anesthetic, under direct ultrasound visualization, a 12 gauge Bard Marquee core needle device was used to perform biopsy of the mass in the axillary tail of the left breast using a lateral approach. At the conclusion of the procedure a ribbon shaped tissue marker clip was deployed into the biopsy cavity. Follow up 2 view mammogram was performed and dictated separately. IMPRESSION: Ultrasound guided biopsy of a mass involving the axillary tail of the left breast. No apparent complications. Electronically Signed: By: Evangeline Dakin M.D. On: 04/26/2017 13:35      IMPRESSION/PLAN:  Left breast high grade carcinoma, unclear if squamous cell cancer metastatic to the axilla or metaplastic breast cancer. She has not had staging  scans.  Her cancer is biologically aggressive and ER 50%, strong staining, PR negative and Her-2 negative. I hope Dr. Lucia Gaskins will feel she would be in reasonable shape for a lumpectomy. This would probably be the best first line treatment for local control. That could be followed by radiation therapy and antiestrogen therapy after a multidisciplinary discussion of her final pathology. Given her age, if she opted out of surgery, we could try some focal radiation to try and shrink the mass or even consider radiation to the whole breast. Radiation could be given over a palliative one week course or a more aggressive 3.5 week course. One advantage of a lumpectomy is not only local control but establishing her true pathology since that is still in question. She will see Dr. Jana Hakim later today to discuss systemic therapies. I imagine the most reasonable approach would be an antiestrogen pill which may have some benefit for her both locally and systemically.  It was a pleasure meeting the patient today. We discussed the risks, benefits, and side effects of radiotherapy. We discussed that radiation could take approximately 1-3.5  week to complete and that I would give the patient a few weeks to heal following surgery before starting treatment planning. We spoke about acute effects including skin irritation and fatigue as well as much less common late effects including internal organ injury or irritation. We spoke about the latest technology that is used to minimize the risk of late effects for patients undergoing radiotherapy to the breast. No guarantees of treatment were given. I look forward to participating in the patient's care. A consent form was signed in the event the patient does proceed with radiation, a copy was provided for the patient. I will await her referral back to me after a multidisciplinary discussion to determine the patient's best course of treatment.   I advised the patient participate in an annual dermatologic visit for history of skin cancers.   I spent 40 minutes  face to face with the patient and more than 50% of that time was spent in counseling and/or coordination of care.   __________________________________________   Eppie Gibson, MD   This document serves as a record of services personally performed by Eppie Gibson, MD. It was created on her behalf by Bethann Humble, a trained medical scribe. The creation of this record is based on the scribe's personal observations and the provider's statements to them. This document has been checked and approved by the attending provider.

## 2017-05-23 NOTE — Addendum Note (Signed)
Addended by: Chauncey Cruel on: 05/23/2017 05:14 PM   Modules accepted: Orders

## 2017-05-25 ENCOUNTER — Telehealth: Payer: Self-pay | Admitting: Oncology

## 2017-05-25 ENCOUNTER — Encounter: Payer: Self-pay | Admitting: Radiation Oncology

## 2017-05-25 NOTE — Telephone Encounter (Signed)
Per 4/10 no los

## 2017-05-27 ENCOUNTER — Ambulatory Visit: Payer: Self-pay | Admitting: Surgery

## 2017-05-27 DIAGNOSIS — Z170 Estrogen receptor positive status [ER+]: Principal | ICD-10-CM

## 2017-05-27 DIAGNOSIS — C50912 Malignant neoplasm of unspecified site of left female breast: Secondary | ICD-10-CM

## 2017-05-30 ENCOUNTER — Telehealth: Payer: Self-pay | Admitting: Surgery

## 2017-06-05 ENCOUNTER — Other Ambulatory Visit: Payer: Self-pay | Admitting: Surgery

## 2017-06-05 DIAGNOSIS — Z170 Estrogen receptor positive status [ER+]: Principal | ICD-10-CM

## 2017-06-05 DIAGNOSIS — C50912 Malignant neoplasm of unspecified site of left female breast: Secondary | ICD-10-CM

## 2017-06-12 ENCOUNTER — Encounter (HOSPITAL_BASED_OUTPATIENT_CLINIC_OR_DEPARTMENT_OTHER): Payer: Self-pay | Admitting: *Deleted

## 2017-06-12 ENCOUNTER — Other Ambulatory Visit: Payer: Self-pay

## 2017-06-12 NOTE — Pre-Procedure Instructions (Signed)
Bring all medications. Plans to come tomorrow for EKG  And Ensure.

## 2017-06-13 ENCOUNTER — Encounter (HOSPITAL_BASED_OUTPATIENT_CLINIC_OR_DEPARTMENT_OTHER)
Admission: RE | Admit: 2017-06-13 | Discharge: 2017-06-13 | Disposition: A | Discharge status: Home / Self Care | Payer: Medicare Other | Source: Ambulatory Visit | Attending: Surgery | Admitting: Surgery

## 2017-06-13 DIAGNOSIS — Z01818 Encounter for other preprocedural examination: Secondary | ICD-10-CM | POA: Insufficient documentation

## 2017-06-13 NOTE — Progress Notes (Signed)
Ensure pre surgery drink given with instructions to complete by [facility], pt verbalized understanding. EKG reviewed by Dr. Gifford Shave, will proceed with surgery as scheduled.

## 2017-06-21 ENCOUNTER — Ambulatory Visit
Admission: RE | Admit: 2017-06-21 | Discharge: 2017-06-21 | Disposition: A | Discharge status: Home / Self Care | Payer: Medicare Other | Source: Ambulatory Visit | Attending: Surgery | Admitting: Surgery

## 2017-06-21 ENCOUNTER — Other Ambulatory Visit: Payer: Self-pay | Admitting: Surgery

## 2017-06-21 DIAGNOSIS — Z170 Estrogen receptor positive status [ER+]: Principal | ICD-10-CM

## 2017-06-21 DIAGNOSIS — C50912 Malignant neoplasm of unspecified site of left female breast: Secondary | ICD-10-CM

## 2017-06-21 NOTE — H&P (Signed)
[name]  Location: [facility] Patient #: [id number] DOB: [birth year] Widowed / Language: Cleophus Molt / Race: White Female  History of Present Illness   The patient is a 84 year old female who presents with a complaint of breast cancer.  The PCP is Dr. Melinda Crutch  The patient was referred by Dr. Alinda Deem. She comes with her daughter, Roderic Scarce. Her granddaughter, Para Skeans, could not stay, but listened in on the phone.  The patient felt some itching in her left axilla around Halloween 2018. At that time she felt a mass in the far lateral aspect of her left breast. It seems there were some delay in seeing her primary care physician for various reasons. She underwent a mammogram at [facility] on 20 April 2016 which showed a 2.2 cm mass in the axillary tail the left breast. She had a biopsy of the left breast mass on 04/26/2017 ([id number]) showed a high grade carcinoma (metastatic squamous cell ca involving a lymph node or metaplastic carcinoma of the breast), ER - 50%, PR - 0%, Ki67 - 80%, Her2Neu - neg She has no family history of breast cancer. She's not been on hormonal therapy. She's had no prior history of breast disease.  I discussed the options for breast cancer treatment with the patient. I discussed a multidisciplinary approach to the treatment of breast cancer, which includes medical oncology and radiation oncology. I discussed the surgical options of lumpectomy vs. mastectomy. If mastectomy, there is the possibility of reconstruction. I discussed the options of lymph node biopsy. The treatment plan depends on the pathologic staging of the tumor and the patient's personal wishes. The risks of surgery include, but are not limited to, bleeding, infection, the need for further surgery, and nerve injury. The patient has been given literature on the treatment of breast cancer.  Past Medical  History: 1. HTN 2. history of UTI's 3. History of surgery for "intestinal growth" around Apr 20, 1942. She later had a bowel blockage around 04/21/2007 that she says required surgery (I cannot find anything in Epic) 4. HH Has significant GERD - can't lay on right side She had a revision of a prior [place] repair in 06/21/2001 by Dr. Hassell Done  5. History of cholecystectomy 6. Spinal stenosis - 04/27/2006 - [facility] 7. Hard of hearing 8. Hysterectomy - 1950's  Social History: Widowed.  Her husband died about 04/21/2007 - Dr. Beryle Beams was involved in his care. Her daughter, Roderic Scarce, lives with her.    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  Her granddaughter, Para Skeans, was here and left. She works in some capacity for [employer].   Past Surgical History Levonne Spiller, CMA; 05/03/2017 10:04 AM) Breast Biopsy  Left. Colon Polyp Removal - Open  Gallbladder Surgery - Open  Hemorrhoidectomy   Diagnostic Studies History Levonne Spiller, CMA; 05/03/2017 10:04 AM) Colonoscopy  >10 years ago Mammogram  within last year Pap Smear  >5 years ago  Allergies Levonne Spiller, [place]; 05/03/2017 10:04 AM) No Known Allergies [05/03/2017]: Allergies Reconciled   Medication History Levonne Spiller, CMA; 05/03/2017 10:05 AM) Amitriptyline HCl (25MG Tablet, Oral) Active. Chlorthalidone (25MG Tablet, Oral) Active. Metoprolol Succinate ER (25MG Tablet ER 24HR, Oral) Active. Pantoprazole Sodium (40MG Tablet DR, Oral) Active. RaNITidine HCl (150MG Capsule, Oral) Active. Ecotrin (325MG Tablet DR, Oral) Active. Medications Reconciled  Social History Andee Poles Education officer, museum, CMA; 05/03/2017 10:04 AM) No alcohol use  No caffeine use  No drug use  Tobacco use  Never smoker.  Family History Levonne Spiller, CMA; 05/03/2017 10:04 AM) Alcohol Abuse  Brother, Father. Arthritis  Daughter, Sister. Cerebrovascular Accident  Mother. Diabetes  Mellitus  Daughter. Heart Disease  Sister. Heart disease in female family member before age 30   Pregnancy / Birth History Levonne Spiller, [place]; 05/03/2017 10:04 AM) Age at menarche  10 years. Gravida  2 Maternal age  57-25 Para  1  Other Problems Levonne Spiller, CMA; 05/03/2017 10:04 AM) Anxiety Disorder  Arthritis  Back Pain  Bladder Problems  Diverticulosis  Gastroesophageal Reflux Disease  Heart murmur  Hemorrhoids  High blood pressure  Hypercholesterolemia  Inguinal Hernia     Review of Systems Andee Poles Gerrigner CMA; 05/03/2017 10:04 AM) General Present- Fatigue. Not Present- Appetite Loss, Chills, Fever, Night Sweats, Weight Gain and Weight Loss. Skin Present- Dryness. Not Present- Change in Wart/Mole, Hives, Jaundice, New Lesions, Non-Healing Wounds, Rash and Ulcer. HEENT Present- Visual Disturbances and Wears glasses/contact lenses. Not Present- Earache, Hearing Loss, Hoarseness, Nose Bleed, Oral Ulcers, Ringing in the Ears, Seasonal Allergies, Sinus Pain, Sore Throat and Yellow Eyes. Cardiovascular Present- Leg Cramps, Rapid Heart Rate and Shortness of Breath. Not Present- Chest Pain, Difficulty Breathing Lying Down, Palpitations and Swelling of Extremities. Gastrointestinal Present- Gets full quickly at meals. Not Present- Abdominal Pain, Bloating, Bloody Stool, Change in Bowel Habits, Chronic diarrhea, Constipation, Difficulty Swallowing, Excessive gas, Hemorrhoids, Indigestion, Nausea, Rectal Pain and Vomiting. Female Genitourinary Not Present- Frequency, Nocturia, Painful Urination, Pelvic Pain and Urgency. Musculoskeletal Present- Back Pain and Joint Stiffness. Not Present- Joint Pain, Muscle Pain, Muscle Weakness and Swelling of Extremities. Neurological Present- Trouble walking and Weakness. Not Present- Decreased Memory, Fainting, Headaches, Numbness, Seizures, Tingling and Tremor. Psychiatric Present- Anxiety and Change in Sleep Pattern.  Not Present- Bipolar, Depression, Fearful and Frequent crying. Endocrine Present- Hair Changes. Not Present- Cold Intolerance, Excessive Hunger, Heat Intolerance, Hot flashes and New Diabetes. Hematology Present- Easy Bruising. Not Present- Blood Thinners, Excessive bleeding, Gland problems, HIV and Persistent Infections.  Vitals Andee Poles Gerrigner CMA; 05/03/2017 10:06 AM) 05/03/2017 10:05 AM Weight: 150.5 lb Height: 65in Body Surface Area: 1.75 m Body Mass Index: 25.04 kg/m  Temp.: 98.77F(Oral)  Pulse: 114 (Regular)  BP: 128/88 (Sitting, Right Arm, Standard)   Physical Exam  General: Old WF who is alert. She does not look bad for her age and she is able to get up on the PE table with minimal of assistance. She is very hard of hearing. Skin: Inspection and palpation of the skin unremarkable.  Eyes: Conjunctivae white, pupils equal. Face, ears, nose, mouth, and throat: Face - normal. Normal ears and nose. Lips and teeth normal.  Neck: Supple. No mass. Trachea midline. No thyroid mass. She has a scar in her left lower neck - reason unclear. Lymph Nodes: No supraclavicular or cervical adenopathy.  She has a scar in the right axilla (? reason)  This mass is in the UOQ of the breast towards the left axillary. It is hard to tell whether there is any axillary adenopathy.  Breasts: Right - Pendulous, no mass. Left - Pendulous, 2.5 cm mass in the UOQ of the left breast, towards the left axilla  Lungs: Normal respiratory effort. Clear to auscultation and symmetric breath sounds. Cardiovascular: Regular rate and rythm. III/VI systolic murmur  Abdomen: Soft. No mass. Liver and spleen not palpable. No tenderness. No hernia. Normal bowel sounds.  Old midline abdominal scar. Rectal: Not done.  Musculoskeletal/extremities: Normal gait. Okay strength and ROM in upper and lower extremities.   Neurologic: Grossly intact  to motor and sensory  function.   Psychiatric: Has normal mood and affect. Judgement and insight appear normal.   Assessment & Plan  1.  BREAST CANCER, STAGE 2, LEFT (C50.912)  Plan:  1)    saw Dr Isidore Moos 4/10 - she's considering some local rad tx   saw Dr. Marjie Skiff on 4/10 - he started anastrazole   2)  Plan left breast lumpectomy Addendum Note(Daksha Koone H. Lucia Gaskins MD; 05/30/2017 1:52 PM) I talked to the daughter, [name], about proceeding with surgery.  2. HTN 3. history of UTI's 4. History of surgery for "intestinal growth" around 1944. She later had a bowel blockage around 2009 that she says required surgery (I cannot find anything in Epic) 5. HH Has significant GERD - can't lay on right side She had a revision of a prior [place] repair in 06/21/2001 by Dr. Hassell Done  6. Spinal stenosis - 04/27/2006 - [facility] 7. Hard of hearing   [name], MD, [facility] Pager: [phone] Office phone:  [phone]

## 2017-06-22 ENCOUNTER — Encounter (HOSPITAL_BASED_OUTPATIENT_CLINIC_OR_DEPARTMENT_OTHER): Payer: Self-pay | Admitting: *Deleted

## 2017-06-22 ENCOUNTER — Ambulatory Visit (HOSPITAL_BASED_OUTPATIENT_CLINIC_OR_DEPARTMENT_OTHER): Payer: Medicare Other | Admitting: Anesthesiology

## 2017-06-22 ENCOUNTER — Ambulatory Visit (HOSPITAL_BASED_OUTPATIENT_CLINIC_OR_DEPARTMENT_OTHER)
Admission: RE | Admit: 2017-06-22 | Discharge: 2017-06-22 | Disposition: A | Discharge status: Home / Self Care | Payer: Medicare Other | Source: Ambulatory Visit | Attending: Surgery | Admitting: Surgery

## 2017-06-22 ENCOUNTER — Ambulatory Visit
Admission: RE | Admit: 2017-06-22 | Discharge: 2017-06-22 | Disposition: A | Discharge status: Home / Self Care | Payer: Medicare Other | Source: Ambulatory Visit | Attending: Surgery | Admitting: Surgery

## 2017-06-22 ENCOUNTER — Encounter (HOSPITAL_BASED_OUTPATIENT_CLINIC_OR_DEPARTMENT_OTHER)
Admission: RE | Disposition: A | Discharge status: Home / Self Care | Payer: Self-pay | Source: Ambulatory Visit | Attending: Surgery

## 2017-06-22 ENCOUNTER — Other Ambulatory Visit: Payer: Self-pay

## 2017-06-22 DIAGNOSIS — C50412 Malignant neoplasm of upper-outer quadrant of left female breast: Secondary | ICD-10-CM | POA: Diagnosis not present

## 2017-06-22 DIAGNOSIS — Z79899 Other long term (current) drug therapy: Secondary | ICD-10-CM | POA: Insufficient documentation

## 2017-06-22 DIAGNOSIS — Z170 Estrogen receptor positive status [ER+]: Secondary | ICD-10-CM | POA: Diagnosis not present

## 2017-06-22 DIAGNOSIS — F419 Anxiety disorder, unspecified: Secondary | ICD-10-CM | POA: Diagnosis not present

## 2017-06-22 DIAGNOSIS — C50912 Malignant neoplasm of unspecified site of left female breast: Secondary | ICD-10-CM

## 2017-06-22 DIAGNOSIS — I10 Essential (primary) hypertension: Secondary | ICD-10-CM | POA: Insufficient documentation

## 2017-06-22 DIAGNOSIS — K219 Gastro-esophageal reflux disease without esophagitis: Secondary | ICD-10-CM | POA: Insufficient documentation

## 2017-06-22 HISTORY — DX: Dorsalgia, unspecified: M54.9

## 2017-06-22 HISTORY — PX: BREAST LUMPECTOMY WITH RADIOACTIVE SEED LOCALIZATION: SHX6424

## 2017-06-22 HISTORY — DX: Urinary tract infection, site not specified: N39.0

## 2017-06-22 HISTORY — DX: Other chronic pain: G89.29

## 2017-06-22 SURGERY — BREAST LUMPECTOMY WITH RADIOACTIVE SEED LOCALIZATION
Anesthesia: General | Site: Breast | Laterality: Left

## 2017-06-22 MED ORDER — LACTATED RINGERS IV SOLN
INTRAVENOUS | Status: DC
  Administered 2017-06-22: 10:00:00 via INTRAVENOUS

## 2017-06-22 MED ORDER — FENTANYL CITRATE (PF) 100 MCG/2ML IJ SOLN: 25.0000 ug | INTRAMUSCULAR | Status: DC | PRN

## 2017-06-22 MED ORDER — EPHEDRINE SULFATE 50 MG/ML IJ SOLN
INTRAMUSCULAR | Status: AC
  Filled 2017-06-22: qty 1

## 2017-06-22 MED ORDER — MEPERIDINE HCL 25 MG/ML IJ SOLN: 6.2500 mg | INTRAMUSCULAR | Status: DC | PRN

## 2017-06-22 MED ORDER — ACETAMINOPHEN 500 MG PO TABS
ORAL_TABLET | ORAL | Status: AC
  Filled 2017-06-22: qty 2

## 2017-06-22 MED ORDER — MIDAZOLAM HCL 2 MG/2ML IJ SOLN: 1.0000 mg | INTRAMUSCULAR | Status: DC | PRN

## 2017-06-22 MED ORDER — OXYCODONE HCL 5 MG PO TABS: 5.0000 mg | ORAL_TABLET | Freq: Once | ORAL | Status: DC | PRN

## 2017-06-22 MED ORDER — CHLORHEXIDINE GLUCONATE CLOTH 2 % EX PADS: 6.0000 | MEDICATED_PAD | Freq: Once | CUTANEOUS | Status: DC

## 2017-06-22 MED ORDER — CEFAZOLIN SODIUM-DEXTROSE 2-4 GM/100ML-% IV SOLN
INTRAVENOUS | Status: AC
  Filled 2017-06-22: qty 100

## 2017-06-22 MED ORDER — BUPIVACAINE-EPINEPHRINE 0.25% -1:200000 IJ SOLN
INTRAMUSCULAR | Status: DC | PRN
  Administered 2017-06-22: 30 mL

## 2017-06-22 MED ORDER — SCOPOLAMINE 1 MG/3DAYS TD PT72: 1.0000 | MEDICATED_PATCH | Freq: Once | TRANSDERMAL | Status: DC | PRN

## 2017-06-22 MED ORDER — PHENYLEPHRINE HCL 10 MG/ML IJ SOLN
INTRAMUSCULAR | Status: AC
  Filled 2017-06-22: qty 1

## 2017-06-22 MED ORDER — DEXAMETHASONE SODIUM PHOSPHATE 4 MG/ML IJ SOLN
INTRAMUSCULAR | Status: DC | PRN
  Administered 2017-06-22: 10 mg via INTRAVENOUS

## 2017-06-22 MED ORDER — ONDANSETRON HCL 4 MG/2ML IJ SOLN
INTRAMUSCULAR | Status: AC
  Filled 2017-06-22: qty 2

## 2017-06-22 MED ORDER — FENTANYL CITRATE (PF) 100 MCG/2ML IJ SOLN
INTRAMUSCULAR | Status: AC
  Filled 2017-06-22: qty 2

## 2017-06-22 MED ORDER — EPHEDRINE SULFATE 50 MG/ML IJ SOLN
INTRAMUSCULAR | Status: DC | PRN
  Administered 2017-06-22: 10 mg via INTRAVENOUS

## 2017-06-22 MED ORDER — PROMETHAZINE HCL 25 MG/ML IJ SOLN: 6.2500 mg | INTRAMUSCULAR | Status: DC | PRN

## 2017-06-22 MED ORDER — ONDANSETRON HCL 4 MG/2ML IJ SOLN
INTRAMUSCULAR | Status: DC | PRN
  Administered 2017-06-22: 4 mg via INTRAVENOUS

## 2017-06-22 MED ORDER — PHENYLEPHRINE HCL 10 MG/ML IJ SOLN
INTRAVENOUS | Status: DC | PRN
  Administered 2017-06-22: 50 ug/min via INTRAVENOUS

## 2017-06-22 MED ORDER — PROPOFOL 10 MG/ML IV BOLUS
INTRAVENOUS | Status: AC
  Filled 2017-06-22: qty 20

## 2017-06-22 MED ORDER — FENTANYL CITRATE (PF) 100 MCG/2ML IJ SOLN
50.0000 ug | INTRAMUSCULAR | Status: DC | PRN
  Administered 2017-06-22: 25 ug via INTRAVENOUS

## 2017-06-22 MED ORDER — LIDOCAINE HCL (CARDIAC) PF 100 MG/5ML IV SOSY
PREFILLED_SYRINGE | INTRAVENOUS | Status: DC | PRN
  Administered 2017-06-22: 100 mg via INTRAVENOUS

## 2017-06-22 MED ORDER — DEXAMETHASONE SODIUM PHOSPHATE 10 MG/ML IJ SOLN
INTRAMUSCULAR | Status: AC
  Filled 2017-06-22: qty 1

## 2017-06-22 MED ORDER — OXYCODONE HCL 5 MG/5ML PO SOLN: 5.0000 mg | Freq: Once | ORAL | Status: DC | PRN

## 2017-06-22 MED ORDER — CEFAZOLIN SODIUM-DEXTROSE 2-4 GM/100ML-% IV SOLN
2.0000 g | INTRAVENOUS | Status: AC
  Administered 2017-06-22: 2 g via INTRAVENOUS

## 2017-06-22 MED ORDER — PROPOFOL 10 MG/ML IV BOLUS
INTRAVENOUS | Status: DC | PRN
  Administered 2017-06-22: 70 mg via INTRAVENOUS

## 2017-06-22 MED ORDER — LIDOCAINE HCL (CARDIAC) PF 100 MG/5ML IV SOSY
PREFILLED_SYRINGE | INTRAVENOUS | Status: AC
Start: 2017-06-22 — End: ?
  Filled 2017-06-22: qty 5

## 2017-06-22 MED ORDER — BUPIVACAINE-EPINEPHRINE (PF) 0.25% -1:200000 IJ SOLN
INTRAMUSCULAR | Status: AC
  Filled 2017-06-22: qty 30

## 2017-06-22 MED ORDER — ACETAMINOPHEN 500 MG PO TABS
1000.0000 mg | ORAL_TABLET | ORAL | Status: AC
  Administered 2017-06-22: 1000 mg via ORAL

## 2017-06-22 SURGICAL SUPPLY — 51 items
BENZOIN TINCTURE PRP APPL 2/3 (GAUZE/BANDAGES/DRESSINGS) IMPLANT
BINDER BREAST LRG (GAUZE/BANDAGES/DRESSINGS) ×2 IMPLANT
BINDER BREAST MEDIUM (GAUZE/BANDAGES/DRESSINGS) IMPLANT
BINDER BREAST XLRG (GAUZE/BANDAGES/DRESSINGS) IMPLANT
BINDER BREAST XXLRG (GAUZE/BANDAGES/DRESSINGS) IMPLANT
BLADE HEX COATED 2.75 (ELECTRODE) IMPLANT
BLADE SURG 10 STRL SS (BLADE) IMPLANT
BLADE SURG 15 STRL LF DISP TIS (BLADE) ×1 IMPLANT
BLADE SURG 15 STRL SS (BLADE) ×1
CANISTER SUC SOCK COL 7IN (MISCELLANEOUS) IMPLANT
CANISTER SUCT 1200ML W/VALVE (MISCELLANEOUS) IMPLANT
CHLORAPREP W/TINT 26ML (MISCELLANEOUS) ×2 IMPLANT
CLIP VESOCCLUDE SM WIDE 6/CT (CLIP) ×2 IMPLANT
COVER BACK TABLE 60X90IN (DRAPES) ×2 IMPLANT
COVER MAYO STAND STRL (DRAPES) ×2 IMPLANT
COVER PROBE W GEL 5X96 (DRAPES) ×2 IMPLANT
DECANTER SPIKE VIAL GLASS SM (MISCELLANEOUS) ×2 IMPLANT
DERMABOND ADVANCED (GAUZE/BANDAGES/DRESSINGS) ×1
DERMABOND ADVANCED .7 DNX12 (GAUZE/BANDAGES/DRESSINGS) ×1 IMPLANT
DEVICE DUBIN W/COMP PLATE 8390 (MISCELLANEOUS) ×2 IMPLANT
DRAPE LAPAROTOMY 100X72 PEDS (DRAPES) ×2 IMPLANT
DRAPE UTILITY XL STRL (DRAPES) ×2 IMPLANT
DRSG PAD ABDOMINAL 8X10 ST (GAUZE/BANDAGES/DRESSINGS) ×2 IMPLANT
ELECT COATED BLADE 2.86 ST (ELECTRODE) ×2 IMPLANT
ELECT REM PT RETURN 9FT ADLT (ELECTROSURGICAL) ×2
ELECTRODE REM PT RTRN 9FT ADLT (ELECTROSURGICAL) ×1 IMPLANT
GAUZE SPONGE 4X4 12PLY STRL LF (GAUZE/BANDAGES/DRESSINGS) ×2 IMPLANT
GLOVE BIOGEL PI IND STRL 7.0 (GLOVE) ×2 IMPLANT
GLOVE BIOGEL PI INDICATOR 7.0 (GLOVE) ×2
GLOVE ECLIPSE 6.5 STRL STRAW (GLOVE) ×2 IMPLANT
GLOVE SURG SIGNA 7.5 PF LTX (GLOVE) ×4 IMPLANT
GOWN STRL REUS W/ TWL LRG LVL3 (GOWN DISPOSABLE) ×1 IMPLANT
GOWN STRL REUS W/ TWL XL LVL3 (GOWN DISPOSABLE) ×1 IMPLANT
GOWN STRL REUS W/TWL LRG LVL3 (GOWN DISPOSABLE) ×1
GOWN STRL REUS W/TWL XL LVL3 (GOWN DISPOSABLE) ×1
KIT MARKER MARGIN INK (KITS) ×2 IMPLANT
NEEDLE HYPO 25X1 1.5 SAFETY (NEEDLE) ×2 IMPLANT
NS IRRIG 1000ML POUR BTL (IV SOLUTION) IMPLANT
PACK BASIN DAY SURGERY FS (CUSTOM PROCEDURE TRAY) ×2 IMPLANT
PENCIL BUTTON HOLSTER BLD 10FT (ELECTRODE) ×2 IMPLANT
SHEET MEDIUM DRAPE 40X70 STRL (DRAPES) IMPLANT
SLEEVE SCD COMPRESS KNEE MED (MISCELLANEOUS) ×2 IMPLANT
SPONGE LAP 18X18 RF (DISPOSABLE) ×2 IMPLANT
STRIP CLOSURE SKIN 1/4X4 (GAUZE/BANDAGES/DRESSINGS) IMPLANT
SUT MNCRL AB 4-0 PS2 18 (SUTURE) ×2 IMPLANT
SUT VICRYL 3-0 CR8 SH (SUTURE) ×2 IMPLANT
SYR CONTROL 10ML LL (SYRINGE) ×2 IMPLANT
TOWEL OR 17X24 6PK STRL BLUE (TOWEL DISPOSABLE) ×2 IMPLANT
TOWEL OR NON WOVEN STRL DISP B (DISPOSABLE) ×2 IMPLANT
TUBE CONNECTING 20X1/4 (TUBING) IMPLANT
YANKAUER SUCT BULB TIP NO VENT (SUCTIONS) IMPLANT

## 2017-06-22 NOTE — Anesthesia Procedure Notes (Signed)
Procedure Name: LMA Insertion Performed by: Markesha Hannig W, CRNA Pre-anesthesia Checklist: Patient identified, Emergency Drugs available, Suction available and Patient being monitored Patient Re-evaluated:Patient Re-evaluated prior to induction Oxygen Delivery Method: Circle system utilized Preoxygenation: Pre-oxygenation with 100% oxygen Induction Type: IV induction Ventilation: Mask ventilation without difficulty LMA: LMA inserted LMA Size: 4.0 Number of attempts: 1 Placement Confirmation: positive ETCO2 Tube secured with: Tape Dental Injury: Teeth and Oropharynx as per pre-operative assessment        

## 2017-06-22 NOTE — Interval H&P Note (Signed)
History and Physical Interval Note:  06/22/2017 10:40 AM  [name]  has presented today for surgery, with the diagnosis of LEFT BREAST CANCER  The various methods of treatment have been discussed with the patient and family.   Daughter at the bedside.  Seed identified.  After consideration of risks, benefits and other options for treatment, the patient has consented to  Procedure(s): BREAST LUMPECTOMY WITH RADIOACTIVE SEED LOCALIZATION (Left) as a surgical intervention .  The patient's history has been reviewed, patient examined, no change in status, stable for surgery.  I have reviewed the patient's chart and labs.  Questions were answered to the patient's satisfaction.     Shann Medal

## 2017-06-22 NOTE — Discharge Instructions (Signed)
CENTRAL  SURGERY - DISCHARGE INSTRUCTIONS TO PATIENT  Activity:  Take it easy for about 7 days.  Wound Care:   Leave incision  Diet:  As tolerated  Follow up appointment:  Call Dr. Pollie Friar office [facility]) at [phone] for an appointment in 2 to 3 weeks.  Medications and dosages:  Resume your home medications.  Call Dr. Lucia Gaskins or his office  [phone]) if you have:  Temperature greater than 100.4,  Persistent nausea and vomiting,  Severe uncontrolled pain,  Redness, tenderness, or signs of infection (pain, swelling, redness, odor or green/yellow discharge around the site),  Any other questions or concerns you may have after discharge.  In an emergency, call 911 or go to an Emergency Department at a nearby hospital.     Post Anesthesia Home Care Instructions  Activity: Get plenty of rest for the remainder of the day. A responsible individual must stay with you for 24 hours following the procedure.  For the next 24 hours, DO NOT: -Drive a car -Paediatric nurse -Drink alcoholic beverages -Take any medication unless instructed by your physician -Make any legal decisions or sign important papers.  Meals: Start with liquid foods such as gelatin or soup. Progress to regular foods as tolerated. Avoid greasy, spicy, heavy foods. If nausea and/or vomiting occur, drink only clear liquids until the nausea and/or vomiting subsides. Call your physician if vomiting continues.  Special Instructions/Symptoms: Your throat may feel dry or sore from the anesthesia or the breathing tube placed in your throat during surgery. If this causes discomfort, gargle with warm salt water. The discomfort should disappear within 24 hours.  If you had a scopolamine patch placed behind your ear for the management of post- operative nausea and/or vomiting:  1. The medication in the patch is effective for 72 hours, after which it should be removed.  Wrap patch in a  tissue and discard in the trash. Wash hands thoroughly with soap and water. 2. You may remove the patch earlier than 72 hours if you experience unpleasant side effects which may include dry mouth, dizziness or visual disturbances. 3. Avoid touching the patch. Wash your hands with soap and water after contact with the patch.

## 2017-06-22 NOTE — Transfer of Care (Signed)
Immediate Anesthesia Transfer of Care Note  Patient: [name]  Procedure(s) Performed: BREAST LUMPECTOMY WITH RADIOACTIVE SEED LOCALIZATION (Left Breast)  Patient Location: PACU  Anesthesia Type:General  Level of Consciousness: awake and sedated  Airway & Oxygen Therapy: Patient Spontanous Breathing and Patient connected to face mask oxygen  Post-op Assessment: Report given to RN and Post -op Vital signs reviewed and stable  Post vital signs: Reviewed and stable  Last Vitals:  Vitals Value Taken Time  BP 158/64 06/22/2017 12:07 PM  Temp    Pulse 66 06/22/2017 12:10 PM  Resp 23 06/22/2017 12:10 PM  SpO2 100 % 06/22/2017 12:10 PM  Vitals shown include unvalidated device data.  Last Pain:  Vitals:   06/22/17 1011  TempSrc: Oral  PainSc: 0-No pain         Complications: No apparent anesthesia complications

## 2017-06-22 NOTE — Op Note (Signed)
06/22/2017  11:59 AM  PATIENT:  [name] DOB: 1920-03-03 MRN: [id number]  PREOP DIAGNOSIS:   LEFT BREAST CANCER  POSTOP DIAGNOSIS:    Left breast cancer, 2 o'clock position (T1, N0)  PROCEDURE:   Procedure(s):  BREAST LUMPECTOMY WITH RADIOACTIVE SEED LOCALIZATION  SURGEON:   [name], M.D.  ANESTHESIA:   general  Anesthesiologist: Nolon Nations, MD CRNA: Terrance Mass, CRNA  General  EBL:  minimal  ml  DRAINS:  none   LOCAL MEDICATIONS USED:   30 cc 1/4% marcaine  SPECIMEN:   Left breast mass (painted with 6 colors), medial margin (painted), node from axilla (not sentinel lymph node)  COUNTS CORRECT:  YES  INDICATIONS FOR PROCEDURE:  [name] is a 84 y.o. (DOB: 1920-06-25) white female whose primary care physician is Lawerance Cruel, MD and comes for left breast lumpectomy.   She has seen Drs. Magrinat and Isidore Moos for medical and radiation oncology.  The options for breast cancer treatment have been discussed with the patient. She elected to proceed with lumpectomy and axillary sentinel lymph node.     The indications and potential complications of surgery were explained to the patient. Potential complications include, but are not limited to, bleeding, infection, the need for further surgery, and nerve injury.     She had a I131 seed placed on 06/21/2017 in her left breast at [facility].  The seed is in the 2 o'clock position of the left breast (almost in the axilla)0.     OPERATIVE NOTE:   The patient was taken to operating room # 4 at [facility] where she underwent a general anesthesia  supervised by Anesthesiologist: Nolon Nations, MD CRNA: Terrance Mass, CRNA. Her left breast and axilla were prepped with  ChloraPrep and sterilely draped.    A time-out and the surgical check list was reviewed.    The mass is at the 2 o'clock in the left breast, almost in the axilla.  I used the Neoprobe to identify the I131 seed.  I tried to  excise an area around the tumor of at least 1 cm.    I excised this block of breast tissue approximately 4 cm by 5 cm  in diameter.   I painted the lumpectomy specimen with the 6 color paint kit and did a specimen mammogram which confirmed the mass, clip, and the seed were all in the right position in the specimen.  The specimen was sent to pathology who called back to confirm that they have the seed and the specimen.    The tumor looked close to the medial margin, so I took an additional medial margin.  There was also a node close to my specimen that I took as a specimen.  This was not a marked sentinel node.   I then irrigated the wound with saline. I infiltrated approximately 30 mL of 1/4% Marcaine between the incisions. I placed 4 clips to mark biopsy cavity, at 12, 3, 6, and 9 o'clock.  I then closed all the wounds in layers using 3-0 Vicryl sutures for the deep layer. At the skin, I closed the incisions with a 4-0 Monocryl suture. The incisions were then painted with Dermabond.  She had gauze place over the wounds and placed in a breast binder.   The patient tolerated the procedure well, was transported to the recovery room in good condition. Sponge and needle count were correct at the end of the case.   Final  pathology is pending.   [name], MD, [facility] Surgery Pager: [phone] Office phone:  [phone]

## 2017-06-22 NOTE — Anesthesia Postprocedure Evaluation (Signed)
Anesthesia Post Note  Patient: [name]  Procedure(s) Performed: BREAST LUMPECTOMY WITH RADIOACTIVE SEED LOCALIZATION (Left Breast)     Patient location during evaluation: PACU Anesthesia Type: General Level of consciousness: sedated and patient cooperative Pain management: pain level controlled Vital Signs Assessment: post-procedure vital signs reviewed and stable Respiratory status: spontaneous breathing Cardiovascular status: stable Anesthetic complications: no    Last Vitals:  Vitals:   06/22/17 1230 06/22/17 1305  BP: (!) 154/61 (!) 153/89  Pulse: 68 78  Resp: 15 18  Temp:  36.6 C  SpO2: 94% 97%    Last Pain:  Vitals:   06/22/17 1305  TempSrc:   PainSc: 0-No pain                 Nolon Nations

## 2017-06-22 NOTE — Anesthesia Preprocedure Evaluation (Addendum)
Anesthesia Evaluation  Patient identified by MRN, date of birth, ID band Patient awake    Reviewed: Allergy & Precautions, NPO status , Patient's Chart, lab work & pertinent test results, reviewed documented beta blocker date and time   Airway Mallampati: II  TM Distance: >3 FB Neck ROM: Full    Dental  (+) Loose, Chipped   Pulmonary neg pulmonary ROS,    Pulmonary exam normal breath sounds clear to auscultation       Cardiovascular hypertension, Pt. on medications and Pt. on home beta blockers  Rhythm:Regular Rate:Normal + Systolic murmurs    Neuro/Psych negative neurological ROS  negative psych ROS   GI/Hepatic negative GI ROS, Neg liver ROS, GERD  ,  Endo/Other  negative endocrine ROS  Renal/GU negative Renal ROS     Musculoskeletal  (+) Arthritis ,   Abdominal   Peds  Hematology negative hematology ROS (+)   Anesthesia Other Findings   Reproductive/Obstetrics negative OB ROS                           Anesthesia Physical Anesthesia Plan  ASA: II  Anesthesia Plan: General   Post-op Pain Management:    Induction: Intravenous  PONV Risk Score and Plan: 3 and Ondansetron, Dexamethasone and Treatment may vary due to age or medical condition  Airway Management Planned: LMA  Additional Equipment:   Intra-op Plan:   Post-operative Plan: Extubation in OR  Informed Consent: I have reviewed the patients History and Physical, chart, labs and discussed the procedure including the risks, benefits and alternatives for the proposed anesthesia with the patient or authorized representative who has indicated his/her understanding and acceptance.   Dental advisory given  Plan Discussed with: CRNA  Anesthesia Plan Comments:         Anesthesia Quick Evaluation

## 2017-06-25 ENCOUNTER — Encounter (HOSPITAL_BASED_OUTPATIENT_CLINIC_OR_DEPARTMENT_OTHER): Payer: Self-pay | Admitting: Surgery

## 2017-06-29 NOTE — Telephone Encounter (Signed)
No telling why this open.  But it is in error.

## 2017-07-02 ENCOUNTER — Encounter: Payer: Self-pay | Admitting: Radiation Oncology

## 2017-07-17 NOTE — Progress Notes (Signed)
Location of Breast Cancer: Left Breast  Histology per Pathology Report:  04/26/17 Diagnosis Breast, left, needle core biopsy, upper outer quadrant, axillary tail at 2 o'clock - HIGH GRADE CARCINOMA  Receptor Status: ER(50%), PR (NEG), Her2-neu (NEG), Ki-(80%)  06/22/17 Diagnosis 1. Breast, lumpectomy, left - INVASIVE DUCTAL CARCINOM, GRADE III/III, SPANNING 1.9 CM. - THE SURGICAL RESECTION MARGINS ARE NEGATIVE FOR CARCINOMA. - SEE ONCOLOGY TABLE BELOW. 2. Lymph node, biopsy, left axillary - THERE IS NO EVIDENCE OF CARCINOMA IN 1 OF 1 LYMPH NODE (0/1). 3. Breast, excision, left additional medial margin - BENIGN FIBROADIPOSE TISSUE. - THERE IS NO EVIDENCE OF MALIGNANCY.  Did patient present with symptoms or was this found on screening mammography?: She felt some itching in her left axilla and felt a mass in her left breast around Halloween 2018. There was some delay in her seeing her PCP  Past/Anticipated interventions by surgeon, if any: 06/22/17 PROCEDURE:   Procedure(s):  BREAST LUMPECTOMY WITH RADIOACTIVE SEED LOCALIZATION SURGEON:   [name], M.D.  Past/Anticipated interventions by medical oncology, if any: Dr. Jana Hakim 05/23/17 at 4:00 (1) definitive surgery pending (2) anastrozole started 05/23/2017 (3) consider adjuvant radiation  ^^^ Ms. Spellman tells me today that she has stopped taking the anastrozole due to nausea. I plan to notify Dr. Virgie Dad nurse today.   Next apt is 08/30/17   Lymphedema issues, if any:  She denies. She has good arm mobility.   Pain issues, if any:  She reports pain to her arms and legs related to arthritis.   SAFETY ISSUES:  Prior radiation? No  Pacemaker/ICD? No  Possible current pregnancy? No  Is the patient on methotrexate? No  Current Complaints / other details:    BP (!) 157/73   Pulse 65   Temp 97.8 F (36.6 C)   Ht _0  (1.651 m)   Wt 149 lb 6.4 oz (67.8 kg)   SpO2 97% Comment: room air  BMI 24.86 kg/m     Wt Readings from Last 3 Encounters:  07/24/17 149 lb 6.4 oz (67.8 kg)  06/22/17 147 lb (66.7 kg)  05/23/17 149 lb 9.6 oz (67.9 kg)

## 2017-07-24 ENCOUNTER — Ambulatory Visit
Admission: RE | Admit: 2017-07-24 | Discharge: 2017-07-24 | Disposition: A | Discharge status: Home / Self Care | Payer: Medicare Other | Source: Ambulatory Visit | Attending: Radiation Oncology | Admitting: Radiation Oncology

## 2017-07-24 ENCOUNTER — Other Ambulatory Visit: Payer: Self-pay

## 2017-07-24 ENCOUNTER — Encounter: Payer: Self-pay | Admitting: Radiation Oncology

## 2017-07-24 VITALS — BP 157/73 | HR 65 | Temp 97.8°F | Ht 65.0 in | Wt 149.4 lb

## 2017-07-24 DIAGNOSIS — C50612 Malignant neoplasm of axillary tail of left female breast: Secondary | ICD-10-CM

## 2017-07-24 DIAGNOSIS — Z510 Encounter for antineoplastic radiation therapy: Secondary | ICD-10-CM | POA: Diagnosis not present

## 2017-07-24 DIAGNOSIS — Z79899 Other long term (current) drug therapy: Secondary | ICD-10-CM | POA: Diagnosis not present

## 2017-07-24 DIAGNOSIS — Z170 Estrogen receptor positive status [ER+]: Secondary | ICD-10-CM | POA: Insufficient documentation

## 2017-07-24 NOTE — Progress Notes (Signed)
Radiation Oncology         [phone] ________________________________  Name: [name] MRN: [id number]  Date: 07/24/2017  DOB: [birth year]  Follow-Up Visit Note  Outpatient  CC: Lawerance Cruel, MD  Magrinat, Virgie Dad, MD  Diagnosis:      ICD-10-CM   1. 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 She is accompanied by her daughter. The two live together in her house.  She will follow up with Dr. Jana Hakim in July. She stopped taking her antiestrogen pill because of nausea.  Nursing will alert med/onc about this   Since consultation, she underwent a lumpectomy on 06/22/2017 revealing: breast, lumpectomy, left; invasive ductal carcinoma, grade III, spanning 1.9 cm, with surgical resection margins negative for carcinoma by at least 57m. Lymph node, biopsy, left axillary (NOT SLN), no evidence of carcinoma. Breast, excision, left additional medial margin; benign fibroadipose tissue with no evidence of malignancy.  She wakes up, drinks her protein shake, and goes back to bed. She enjoys puzzles and reading. She naps occasionally. She stays up until about 10 pm to hear from her grandson who lives in [place] Her daughter will be traveling the 18th through the 26th of this month and she cannot receive RT on those dates.       ALLERGIES:  is allergic to codeine.  Meds: Current Outpatient Medications  Medication Sig Dispense Refill  . amitriptyline (ELAVIL) 25 MG tablet   0  . aspirin 325 MG EC tablet Take 325 mg by mouth daily.    . chlorthalidone (HYGROTON) 25 MG tablet Take  25 mg by mouth daily.    . cholecalciferol (VITAMIN D) 1000 units tablet Take 1 tablet (1,000 Units total) by mouth daily.    . metoprolol succinate (TOPROL-XL) 25 MG 24 hr tablet Take 25 mg by mouth daily.    . Multiple Vitamins-Minerals (WOMENS MULTI PO) Take by mouth.    . pantoprazole (PROTONIX) 20 MG tablet Take 20 mg by mouth daily.    .Marland Kitchenanastrozole (ARIMIDEX) 1 MG tablet Take 1 tablet (1 mg total) by mouth daily. (Patient not taking: Reported on 07/24/2017) 90 tablet 4   No current facility-administered medications for this encounter.     Physical Findings:  height is 5' 5" (1.651 m) and weight is 149 lb 6.4 oz (67.8 kg). Her temperature is 97.8 F (36.6 C). Her blood pressure is 157/73 (abnormal) and her pulse is 65. Her oxygen saturation is 97%. .    General: Alert and oriented, in no acute distress Excellent range of motion in shoulders. Breast exam reveals the axillary scar over the left breast has healed well.    Lab Findings: Lab Results  Component Value Date   WBC 8.0 05/23/2017   HGB 13.9 05/23/2017   HCT 41.0 05/23/2017   MCV 91.9 05/23/2017   PLT 330 05/23/2017      Radiographic Findings: No results found.  Impression/Plan: left breast cancer - aggressive histology  Patient has an ECOG PS of 3.  Reports she has limited energy and appointments are burdensome and difficult.  We discussed adjuvant  radiotherapy today.  I recommend radiotherapy to the left breast and axilla in order to reduce risk of locoregional recurrence.  The risks, benefits and side effects of this treatment were discussed in detail.  She understands that radiotherapy is associated with skin irritation and fatigue in the acute setting. Late effects can include cosmetic changes and rare injury to internal organs.   She is enthusiastic about proceeding with treatment. A consent form has been signed and placed in her chart.  A total of 3 medically necessary complex treatment devices will be  fabricated and supervised by me: 2 fields with MLCs for custom blocks to protect heart, and lungs;  and, a Vac-lok. MORE COMPLEX DEVICES MAY BE MADE IN DOSIMETRY FOR FIELD IN FIELD BEAMS FOR DOSE HOMOGENEITY.  I have requested : 3D Simulation which is medically necessary to give adequate dose to at risk tissues while sparing lungs and heart.  I have requested a DVH of the following structures: lungs, heart, left lumpectomy cavity, axilla.    The patient will receive 28.5 Gy in 5 fractions (once/week) to the left breast/axilla with 2 fields.  This will be not followed by a boost.  More standard fractionation regimens were discussed.  However the patient and her daughter do not think it would be realistic for her to come 5 days a week for 3 to 4 weeks.  Therefore, I will be using a regimen based on the [place] FAST trial which was well-tolerated with good results  Her daughter will be traveling the 18th through the 26th of this month and she cannot receive RT on those dates.  She will receive treatment once a week for 5 weeks starting on 08/10/2017.   I spent 20 minutes face to face with the patient and more than 50% of that time was spent in counseling and/or coordination of care. _____________________________________   Eppie Gibson, MD  This document serves as a record of services personally performed by Eppie Gibson, MD. It was created on his behalf by Wilburn Mylar, a trained medical scribe. The creation of this record is based on the scribe's personal observations and the provider's statements to them. This document has been checked and approved by the attending provider.

## 2017-07-24 NOTE — Progress Notes (Signed)
  Radiation Oncology         [phone] ________________________________  Name: ANASTAISA WOODING MRN: [id number]  Date: 07/24/2017  DOB: [birth year]  SIMULATION AND TREATMENT PLANNING NOTE    Outpatient  DIAGNOSIS:     ICD-10-CM   1. 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  Identity was confirmed.  All relevant records and images related to the planned course of therapy were reviewed.  The patient freely provided informed written consent to proceed with treatment after reviewing the details related to the planned course of therapy. The consent form was witnessed and verified by the simulation staff.    Then, the patient was set-up in a stable reproducible supine position for radiation therapy with her ipsilateral arm over her head, and her upper body secured in a custom-made Vac-lok device.  CT images were obtained.  Surface markings were placed.  The CT images were loaded into the planning software.    TREATMENT PLANNING NOTE: Treatment planning then occurred.  The radiation prescription was entered and confirmed.     A total of 3 medically necessary complex treatment devices were fabricated and supervised by me: 2 fields with MLCs for custom blocks to protect heart, and lungs;  and, a Vac-lok. MORE COMPLEX DEVICES MAY BE MADE IN DOSIMETRY FOR FIELD IN FIELD BEAMS FOR DOSE HOMOGENEITY.  I have requested : 3D Simulation which is medically necessary to give adequate dose to at risk tissues while sparing lungs and heart.  I have requested a DVH of the following structures: lungs, heart, lumpectomy cavity, axillary nodes.    The patient will receive 28.5 Gy in 5 fractions (once weekly) to the left breast/axilla with 2 tangential fields.  This will not be followed by a boost.  Optical Surface Tracking Plan:  Since intensity modulated radiotherapy  (IMRT) and 3D conformal radiation treatment methods are predicated on accurate and precise positioning for treatment, intrafraction motion monitoring is medically necessary to ensure accurate and safe treatment delivery. The ability to quantify intrafraction motion without excessive ionizing radiation dose can only be performed with optical surface tracking. Accordingly, surface imaging offers the opportunity to obtain 3D measurements of patient position throughout IMRT and 3D treatments without excessive radiation exposure. I am ordering optical surface tracking for this patient's upcoming course of radiotherapy.  ________________________________   Reference:  Ursula Alert, J, et al. Surface imaging-based analysis of intrafraction motion for breast radiotherapy patients.Journal of [place], n. 6, nov. 2014. ISSN [id number].  Available at: <http://www.jacmp.org/index.php/jacmp/article/view/4957>.    -----------------------------------  Eppie Gibson, MD

## 2017-07-27 DIAGNOSIS — Z510 Encounter for antineoplastic radiation therapy: Secondary | ICD-10-CM | POA: Diagnosis not present

## 2017-08-10 ENCOUNTER — Ambulatory Visit
Admission: RE | Admit: 2017-08-10 | Discharge: 2017-08-10 | Disposition: A | Discharge status: Home / Self Care | Payer: Medicare Other | Source: Ambulatory Visit | Attending: Radiation Oncology | Admitting: Radiation Oncology

## 2017-08-10 DIAGNOSIS — Z510 Encounter for antineoplastic radiation therapy: Secondary | ICD-10-CM | POA: Diagnosis not present

## 2017-08-17 ENCOUNTER — Ambulatory Visit
Admission: RE | Admit: 2017-08-17 | Discharge: 2017-08-17 | Disposition: A | Discharge status: Home / Self Care | Payer: Medicare Other | Source: Ambulatory Visit | Attending: Radiation Oncology | Admitting: Radiation Oncology

## 2017-08-17 DIAGNOSIS — Z170 Estrogen receptor positive status [ER+]: Secondary | ICD-10-CM | POA: Diagnosis not present

## 2017-08-17 DIAGNOSIS — C50612 Malignant neoplasm of axillary tail of left female breast: Secondary | ICD-10-CM | POA: Diagnosis not present

## 2017-08-17 DIAGNOSIS — Z510 Encounter for antineoplastic radiation therapy: Secondary | ICD-10-CM | POA: Insufficient documentation

## 2017-08-24 ENCOUNTER — Ambulatory Visit
Admission: RE | Admit: 2017-08-24 | Discharge: 2017-08-24 | Disposition: A | Discharge status: Home / Self Care | Payer: Medicare Other | Source: Ambulatory Visit | Attending: Radiation Oncology | Admitting: Radiation Oncology

## 2017-08-24 DIAGNOSIS — Z510 Encounter for antineoplastic radiation therapy: Secondary | ICD-10-CM | POA: Diagnosis not present

## 2017-08-29 ENCOUNTER — Ambulatory Visit
Admission: RE | Admit: 2017-08-29 | Discharge: 2017-08-29 | Disposition: A | Discharge status: Home / Self Care | Payer: Medicare Other | Source: Ambulatory Visit | Attending: Oncology | Admitting: Oncology

## 2017-08-29 DIAGNOSIS — C50612 Malignant neoplasm of axillary tail of left female breast: Secondary | ICD-10-CM

## 2017-08-29 DIAGNOSIS — Z170 Estrogen receptor positive status [ER+]: Principal | ICD-10-CM

## 2017-08-29 NOTE — Progress Notes (Signed)
[place]  [phone]) [phone] [phone]     ID: [name] DOB: [birth year]  MR#: [id number]  GQQ#:[id number]  Patient Care Team: Lawerance Cruel, MD as PCP - General (Family Medicine) Magrinat, Virgie Dad, MD as Consulting Physician (Oncology) [name], MD as Consulting Physician (General Surgery) Eppie Gibson, MD as Attending Physician (Radiation Oncology) OTHER MD:  CHIEF COMPLAINT: Anaplastic estrogen receptor positive breast cancer  CURRENT TREATMENT: adjuvant radiation in progress   HISTORY OF CURRENT ILLNESS: From the original intake note:  [name] stopped having annual mammographies approximately 10 years ago.  More recently she noted a palpable mass in the axillary tail of her left breast.  She was referred for bilateral diagnostic mammography with tomography and left breast ultrasonography at the breast center 04/20/2017.  The breast density was category B.  In the axillary tail of the left breast there was a lobulated mass with no other findings of concern in either breast.  The mass was palpable and easily movable.  Ultrasonography confirmed a hypoechoic mass measuring 2.8 cm which was both solid and cystic.  Ultrasound of the left axilla was negative.  On 04/26/2017 the patient underwent biopsy of the left breast mass in question.  This showed ([id number]) high-grade carcinoma with squamous differentiation.  The tumor was estrogen receptor 50% positive, with strong staining intensity, progesterone receptor negative, with an MIB-1 of 80% and no HER-2 amplification, the signals ratio being 0.79 and the number per cell 2.85.  The patient's subsequent history is as detailed below.  INTERVAL HISTORY: [name] returns today for follow up and treatment of her estrogen receptor positive breast cancer accompanied by her daughter.  [name] stopped taking anastrozole after about 2 weeks due to nausea.  She is unwilling to restart  it or to consider other antiestrogens at this point.  She started radiation treatments on 08/17/2017.  She is being treated with once weekly treatments.  She is tolerating this well so far. She denies any skin changes. She feels tired, but no more than usual.   Since her last visit she underwent left lumpectomy with lymph node sampling ([id number]) on 06/22/2017 showing: Invasive ductal carcinoma grade III, spanning 1.9 cm. Margins are negative. One left axillary lymph node was negative for carcinoma.   REVIEW OF SYSTEMS: [name] reports that she is fine overall. She continues to use her walker. She denies unusual headaches, visual changes, nausea, vomiting, or dizziness. There has been no unusual cough, phlegm production, or pleurisy. This been no change in bowel or bladder habits. She denies unexplained fatigue or unexplained weight loss, bleeding, rash, or fever. A detailed review of systems was otherwise stable.   PAST MEDICAL HISTORY: Past Medical History:  Diagnosis Date  . Arthritis   . Chronic back pain   . GERD (gastroesophageal reflux disease)   . Hypertension   . UTI (urinary tract infection)     PAST SURGICAL HISTORY: Past Surgical History:  Procedure Laterality Date  . ABDOMINAL HYSTERECTOMY     she still has one ovary in place.   Marland Kitchen BREAST LUMPECTOMY WITH RADIOACTIVE SEED LOCALIZATION Left 06/22/2017   Procedure: BREAST LUMPECTOMY WITH RADIOACTIVE SEED LOCALIZATION;  Surgeon: [name], MD;  Location: [facility];  Service: General;  Laterality: Left;  . CHOLECYSTECTOMY    . HERNIA REPAIR      FAMILY HISTORY No family history on file.  Her father died at 27 from pneumonia. Her mother died at 67. The patient had  four brothers and 2 sisters. One sister is still alive.   GYNECOLOGIC HISTORY:  No LMP recorded. Patient is postmenopausal. Menarche: 84 years old Age at first live birth: 84 years old [place] P 1 LMP unsure Contraceptive no HRT: no    Hysterectomy? no SO? no  SOCIAL HISTORY:  She is retired from various jobs, mostly in [place] and [employer].  She also worked briefly for the post office.  Her husband died from cancer and was treated in this center..  The patient's only child, her daughter [name], lives with her.  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  She has a different routes.  The patient has three grandchildren and 5 great-grandchildren. She belongs to [employer].     ADVANCED DIRECTIVES: Need to be addressed   HEALTH MAINTENANCE: Social History   Tobacco Use  . Smoking status: Never Smoker  . Smokeless tobacco: Never Used  Substance Use Topics  . Alcohol use: No  . Drug use: No     Colonoscopy:  PAP:  Bone density:   Allergies  Allergen Reactions  . Codeine Nausea And Vomiting    Current Outpatient Medications  Medication Sig Dispense Refill  . amitriptyline (ELAVIL) 25 MG tablet   0  . anastrozole (ARIMIDEX) 1 MG tablet Take 1 tablet (1 mg total) by mouth daily. (Patient not taking: Reported on 07/24/2017) 90 tablet 4  . aspirin 325 MG EC tablet Take 325 mg by mouth daily.    . chlorthalidone (HYGROTON) 25 MG tablet Take 25 mg by mouth daily.    . cholecalciferol (VITAMIN D) 1000 units tablet Take 1 tablet (1,000 Units total) by mouth daily.    . metoprolol succinate (TOPROL-XL) 25 MG 24 hr tablet Take 25 mg by mouth daily.    . Multiple Vitamins-Minerals (WOMENS MULTI PO) Take by mouth.    . pantoprazole (PROTONIX) 20 MG tablet Take 20 mg by mouth daily.     No current facility-administered medications for this visit.     OBJECTIVE: Elderly white woman using a walker  Vitals:   08/30/17 1430  BP: 108/68  Pulse: 93  Resp: 18  Temp: (!) 97.5 F (36.4 C)     Body mass index is 24.73 kg/m.   Wt Readings from Last 3 Encounters:  08/30/17 148 lb 9.6 oz (67.4 kg)  07/24/17 149 lb 6.4 oz  (67.8 kg)  06/22/17 147 lb (66.7 kg)      ECOG FS:2 - Symptomatic, <50% confined to bed  Sclerae unicteric, EOMs intact Oropharynx clear and moist No cervical or supraclavicular adenopathy Lungs no rales or rhonchi Heart regular rate and rhythm Abd soft, nontender, positive bowel sounds MSK no focal spinal tenderness, no upper extremity lymphedema Neuro: nonfocal, well oriented, appropriate affect Breasts: The right breast is benign.  She is status post left lumpectomy, with a good cosmetic result.  There is no erythema over the radiation field.  There is no evidence of residual or recurrent disease.  Both axillae are benign.    LAB RESULTS:  CMP     Component Value Date/Time   NA 133 (L) 05/23/2017 1544   K 3.3 (L) 05/23/2017 1544   CL 91 (L) 05/23/2017 1544   CO2 31 (H) 05/23/2017 1544   GLUCOSE 110 05/23/2017 1544   BUN 19 05/23/2017 1544   CREATININE 1.31 (H) 05/23/2017 1544  CALCIUM 10.2 05/23/2017 1544   PROT 7.1 05/23/2017 1544   ALBUMIN 3.6 05/23/2017 1544   AST 20 05/23/2017 1544   ALT 13 05/23/2017 1544   ALKPHOS 78 05/23/2017 1544   BILITOT 0.4 05/23/2017 1544   GFRNONAA 34 (L) 05/23/2017 1544   GFRAA 39 (L) 05/23/2017 1544    No results found for: TOTALPROTELP, ALBUMINELP, A1GS, A2GS, BETS, BETA2SER, GAMS, MSPIKE, SPEI  No results found for: KPAFRELGTCHN, LAMBDASER, KAPLAMBRATIO  Lab Results  Component Value Date   WBC 8.0 05/23/2017   NEUTROABS 4.5 05/23/2017   HGB 13.9 05/23/2017   HCT 41.0 05/23/2017   MCV 91.9 05/23/2017   PLT 330 05/23/2017    @LASTCHEMISTRY @  No results found for: LABCA2  No components found for: POEUMP536  No results for input(s): INR in the last 168 hours.  No results found for: LABCA2  No results found for: RWE315  No results found for: QMG867  No results found for: YPP509  No results found for: CA2729  No components found for: HGQUANT  No results found for: CEA1 / No results found for: CEA1   No  results found for: AFPTUMOR  No results found for: CHROMOGRNA  No results found for: PSA1  No visits with results within 3 Day(s) from this visit.  Latest known visit with results is:  Appointment on 05/23/2017  Component Date Value Ref Range Status  . WBC Count 05/23/2017 8.0  3.9 - 10.3 K/uL Final  . RBC 05/23/2017 4.46  3.70 - 5.45 MIL/uL Final  . Hemoglobin 05/23/2017 13.9  11.6 - 15.9 g/dL Final  . HCT 05/23/2017 41.0  34.8 - 46.6 % Final  . MCV 05/23/2017 91.9  79.5 - 101.0 fL Final  . MCH 05/23/2017 31.1  25.1 - 34.0 pg Final  . MCHC 05/23/2017 33.8  31.5 - 36.0 g/dL Final  . RDW 05/23/2017 14.4  11.2 - 14.5 % Final  . Platelet Count 05/23/2017 330  145 - 400 K/uL Final  . Neutrophils Relative % 05/23/2017 55  % Final  . Neutro Abs 05/23/2017 4.5  1.5 - 6.5 K/uL Final  . Lymphocytes Relative 05/23/2017 32  % Final  . Lymphs Abs 05/23/2017 2.6  0.9 - 3.3 K/uL Final  . Monocytes Relative 05/23/2017 10  % Final  . Monocytes Absolute 05/23/2017 0.8  0.1 - 0.9 K/uL Final  . Eosinophils Relative 05/23/2017 2  % Final  . Eosinophils Absolute 05/23/2017 0.2  0.0 - 0.5 K/uL Final  . Basophils Relative 05/23/2017 1  % Final  . Basophils Absolute 05/23/2017 0.0  0.0 - 0.1 K/uL Final   Performed at [facility], [place] [address] [place], [place] [address]  . Sodium 05/23/2017 133* 136 - 145 mmol/L Final  . Potassium 05/23/2017 3.3* 3.5 - 5.1 mmol/L Final  . Chloride 05/23/2017 91* 98 - 109 mmol/L Final  . CO2 05/23/2017 31* 22 - 29 mmol/L Final  . Glucose, Bld 05/23/2017 110  70 - 140 mg/dL Final  . BUN 05/23/2017 19  7 - 26 mg/dL Final  . Creatinine 05/23/2017 1.31* 0.60 - 1.10 mg/dL Final  . Calcium 05/23/2017 10.2  8.4 - 10.4 mg/dL Final  . Total Protein 05/23/2017 7.1  6.4 - 8.3 g/dL Final  . Albumin 05/23/2017 3.6  3.5 - 5.0 g/dL Final  . AST 05/23/2017 20  5 - 34 U/L Final  . ALT 05/23/2017 13  0 - 55 U/L Final  . Alkaline Phosphatase 05/23/2017 78   40 -  150 U/L Final  . Total Bilirubin 05/23/2017 0.4  0.2 - 1.2 mg/dL Final  . GFR, Est Non Af Am 05/23/2017 34* >60 mL/min Final  . GFR, Est AFR Am 05/23/2017 39* >60 mL/min Final   Comment: (NOTE) The eGFR has been calculated using the CKD EPI equation. This calculation has not been validated in all clinical situations. eGFR's persistently <60 mL/min signify possible Chronic Kidney Disease.   Georgiann Hahn gap 05/23/2017 11  3 - 11 Final   Performed at [facility], [address] [place], [place] [address]    (this displays the last labs from the last 3 days)  No results found for: TOTALPROTELP, ALBUMINELP, A1GS, A2GS, BETS, BETA2SER, GAMS, MSPIKE, SPEI (this displays SPEP labs)  No results found for: KPAFRELGTCHN, LAMBDASER, KAPLAMBRATIO (kappa/lambda light chains)  No results found for: HGBA, HGBA2QUANT, HGBFQUANT, HGBSQUAN (Hemoglobinopathy evaluation)   No results found for: LDH  No results found for: IRON, TIBC, IRONPCTSAT (Iron and TIBC)  No results found for: FERRITIN  Urinalysis    Component Value Date/Time   COLORURINE YELLOW 12/09/2013 2007   APPEARANCEUR CLOUDY (A) 12/09/2013 2007   LABSPEC 1.017 12/09/2013 2007   PHURINE 5.5 12/09/2013 2007   GLUCOSEU NEGATIVE 12/09/2013 2007   HGBUR SMALL (A) 12/09/2013 2007   BILIRUBINUR NEGATIVE 12/09/2013 2007   [place] 12/09/2013 2007   PROTEINUR NEGATIVE 12/09/2013 2007   UROBILINOGEN 0.2 12/09/2013 2007   NITRITE NEGATIVE 12/09/2013 2007   LEUKOCYTESUR LARGE (A) 12/09/2013 2007     STUDIES: No results found.  ELIGIBLE FOR AVAILABLE RESEARCH PROTOCOL: no  ASSESSMENT: 84 y.o. [place], [place] woman status post left breast axillary tail biopsy 04/26/2017 for a clinical T2 N0, stage IIB metaplastic breast cancer, estrogen receptor positive, progesterone receptor and HER-2 negative, with an MIB-1 of 80%  (1) status post left lumpectomy and sentinel lymph node sampling 06/22/2017 for a  pT1c pN0, stage 1A invasive ductal carcinoma, grade 3, with negative margins  (a) 1 sentinel lymph node was removed from the left axilla  (2) anastrozole started 05/23/2017, discontinued after 2 weeks because of nausea  (3) adjuvant radiation 08/17/2017  PLAN: Tenee is very appreciative of the help that she is receiving as far as her breast cancer is concerned and has a good understanding of her situation.  She has agreed to treatment but is not willing to proceed with treatment if it is going to make her feel worse.  Unfortunately the antiestrogens made her feel worse and she does not want to go back to the house.  She is willing to complete her radiation.  This means she would have had good local therapy.  Given her very advanced age, and the associated competing mortality possibilities, I am comfortable with her decision not to seek systemic treatment but observation only.  Accordingly I am going to see her again next April, after her March mammogram  Of course I will be glad to see her at any point before then if and when the need arises.   Magrinat, Virgie Dad, MD  08/30/17 3:10 PM Medical Oncology and Hematology [facility] [address] [place], [place] [address] Tel. [phone]    Fax. [phone]  Alice Rieger, am acting as scribe for Chauncey Cruel MD.  I, Lurline Del MD, have reviewed the above documentation for accuracy and completeness, and I agree with the above.

## 2017-08-30 ENCOUNTER — Telehealth: Payer: Self-pay | Admitting: Oncology

## 2017-08-30 ENCOUNTER — Inpatient Hospital Stay: Payer: Medicare Other | Attending: Oncology | Admitting: Oncology

## 2017-08-30 VITALS — BP 108/68 | HR 93 | Temp 97.5°F | Resp 18 | Ht 65.0 in | Wt 148.6 lb

## 2017-08-30 DIAGNOSIS — Z9223 Personal history of estrogen therapy: Secondary | ICD-10-CM | POA: Diagnosis not present

## 2017-08-30 DIAGNOSIS — M1990 Unspecified osteoarthritis, unspecified site: Secondary | ICD-10-CM

## 2017-08-30 DIAGNOSIS — K219 Gastro-esophageal reflux disease without esophagitis: Secondary | ICD-10-CM | POA: Diagnosis not present

## 2017-08-30 DIAGNOSIS — Z79899 Other long term (current) drug therapy: Secondary | ICD-10-CM

## 2017-08-30 DIAGNOSIS — Z170 Estrogen receptor positive status [ER+]: Secondary | ICD-10-CM | POA: Diagnosis not present

## 2017-08-30 DIAGNOSIS — Z7982 Long term (current) use of aspirin: Secondary | ICD-10-CM | POA: Insufficient documentation

## 2017-08-30 DIAGNOSIS — R110 Nausea: Secondary | ICD-10-CM | POA: Insufficient documentation

## 2017-08-30 DIAGNOSIS — M549 Dorsalgia, unspecified: Secondary | ICD-10-CM | POA: Diagnosis not present

## 2017-08-30 DIAGNOSIS — G8929 Other chronic pain: Secondary | ICD-10-CM

## 2017-08-30 DIAGNOSIS — C50612 Malignant neoplasm of axillary tail of left female breast: Secondary | ICD-10-CM | POA: Diagnosis not present

## 2017-08-30 DIAGNOSIS — I10 Essential (primary) hypertension: Secondary | ICD-10-CM | POA: Diagnosis not present

## 2017-08-30 NOTE — Telephone Encounter (Signed)
Per 7/18 los.  Gave patient's daughter avs and calendar.

## 2017-08-31 ENCOUNTER — Ambulatory Visit
Admission: RE | Admit: 2017-08-31 | Discharge: 2017-08-31 | Disposition: A | Discharge status: Home / Self Care | Payer: Medicare Other | Source: Ambulatory Visit | Attending: Radiation Oncology | Admitting: Radiation Oncology

## 2017-08-31 DIAGNOSIS — Z510 Encounter for antineoplastic radiation therapy: Secondary | ICD-10-CM | POA: Diagnosis not present

## 2017-09-03 ENCOUNTER — Other Ambulatory Visit: Payer: Medicare Other

## 2017-09-07 ENCOUNTER — Ambulatory Visit
Admission: RE | Admit: 2017-09-07 | Discharge: 2017-09-07 | Disposition: A | Discharge status: Home / Self Care | Payer: Medicare Other | Source: Ambulatory Visit | Attending: Radiation Oncology | Admitting: Radiation Oncology

## 2017-09-07 DIAGNOSIS — Z510 Encounter for antineoplastic radiation therapy: Secondary | ICD-10-CM | POA: Diagnosis not present

## 2017-09-07 DIAGNOSIS — Z170 Estrogen receptor positive status [ER+]: Principal | ICD-10-CM

## 2017-09-07 DIAGNOSIS — C50612 Malignant neoplasm of axillary tail of left female breast: Secondary | ICD-10-CM

## 2017-09-07 MED ORDER — RADIAPLEXRX EX GEL
Freq: Once | CUTANEOUS | Status: AC
  Administered 2017-09-07: 12:00:00 via TOPICAL

## 2017-09-11 ENCOUNTER — Encounter: Payer: Self-pay | Admitting: Radiation Oncology

## 2017-09-11 NOTE — Progress Notes (Signed)
  Radiation Oncology         [phone] ________________________________  Name: [name] MRN: [id number]  Date: 09/11/2017  DOB: [birth year]  End of Treatment Note  Diagnosis:   Stage IA Malignant Neoplasm of Axillary Tail of Left Breast, ER+, PR-, Her2-    Cancer Staging 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, estrogen receptor positive ([place]) Staging form: Breast, AJCC 8th Edition - Pathologic: Stage IA (pT1c, pN0, cM0, G3, ER+, PR-, HER2-) - Unsigned   Indication for treatment:   Local control  Radiation treatment dates:  08/10/17 - 09/07/17  Site/dose:    Breast, Left/ total of 28.50 Gy in 5 fractions of 5.70 Gy given on a weekly basis.  Beams/energy:    3D, Photon/ 6X, 10X   Narrative: The patient tolerated radiation treatment relatively well. She denied pain and fatigue throughout. She experienced mild erythema.   Plan: The patient has completed radiation treatment. The patient will return to radiation oncology clinic for routine followup in one month. I advised them to call or return sooner if they have any questions or concerns related to their recovery or treatment.  -----------------------------------  Eppie Gibson, MD  This document serves as a record of services personally performed by Eppie Gibson, MD. It was created on his behalf by Wilburn Mylar, a trained medical scribe. The creation of this record is based on the scribe's personal observations and the provider's statements to them. This document has been checked and approved by the attending provider.

## 2017-10-10 ENCOUNTER — Encounter: Payer: Self-pay | Admitting: Radiation Oncology

## 2017-10-10 NOTE — Progress Notes (Signed)
error 

## 2017-10-16 ENCOUNTER — Ambulatory Visit
Admission: RE | Admit: 2017-10-16 | Discharge: 2017-10-16 | Disposition: A | Discharge status: Home / Self Care | Payer: Medicare Other | Source: Ambulatory Visit | Attending: Radiation Oncology | Admitting: Radiation Oncology

## 2017-10-16 ENCOUNTER — Telehealth: Payer: Self-pay | Admitting: Radiation Oncology

## 2017-10-16 HISTORY — DX: Personal history of irradiation: Z92.3

## 2017-10-16 NOTE — Telephone Encounter (Signed)
Per Roderic Scarce, caretaker, [name] does not want to follow up today at 2pm. I have lvm to see if they would like to reschedule. p [phone].

## 2017-10-16 NOTE — Telephone Encounter (Signed)
Ms. [name] called back to say that they will not be rescheduling the f/u visit for [name]. Sent Dr. Margie Ege, RN an inbasket.

## 2018-02-07 ENCOUNTER — Encounter: Payer: Self-pay | Admitting: Cardiology

## 2018-02-17 NOTE — Progress Notes (Signed)
Cardiology Office Note   Date:  02/18/2018   ID:  [name], DOB 11-Jul-1920, MRN [id number]  PCP:  Lawerance Cruel, MD  Cardiologist:   No primary care provider on file. Referring:  Lawerance Cruel, MD  Chief Complaint  Patient presents with  . Fatigue  . Cough      History of Present Illness: [name] is a 85 y.o. female who presents for evaluation of new onset atrial fib.  The patient has no past cardiac history.  Over Christmas she just was not feeling well.  Her grandson lives in [place] apparently used his Alive Cor device and captured atrial fibrillation.  She went to see her primary provider on the 26 and was in fibrillation.  She was started on low-dose of Eliquis.  She is not been feeling quite as well though she cannot quantify or qualify this very much.  She is felt a little bit weaker.  There may have been some wheezing.  She apparently had a chest x-ray which I do not have but there was no edema or pneumonia.  By her daughter's report her grandson took some other readings and found her to paroxysmally be in sinus rhythm.  She is not had any presyncope or syncope.  She would not know that her heart was irregular.  She is not had any chest pressure, neck or arm discomfort.  She does not describe PND or orthopnea.  She is had mild nonproductive cough.  She is not had any fevers or chills.  She has had no significant weight gain or edema.  Of note she did have an echocardiogram in 2012 demonstrating moderate LVH with some mild aortic root enlargement but a normal ejection fraction.   Past Medical History:  Diagnosis Date  . Arthritis   . Breast cancer ([place])   . Chronic back pain   . GERD (gastroesophageal reflux disease)   . History of radiation therapy 08/10/17- 09/07/17   Left Breast 28.50 Gy in 5 fractions given on a weekly basis.   . Hypertension   . UTI (urinary tract infection)     Past Surgical History:  Procedure Laterality Date  . ABDOMINAL  HYSTERECTOMY     she still has one ovary in place.   Marland Kitchen BREAST LUMPECTOMY    . BREAST LUMPECTOMY WITH RADIOACTIVE SEED LOCALIZATION Left 06/22/2017   Procedure: BREAST LUMPECTOMY WITH RADIOACTIVE SEED LOCALIZATION;  Surgeon: [name], MD;  Location: [facility];  Service: General;  Laterality: Left;  . CHOLECYSTECTOMY    . HERNIA REPAIR       Current Outpatient Medications  Medication Sig Dispense Refill  . amitriptyline (ELAVIL) 25 MG tablet   0  . apixaban (ELIQUIS) 5 MG TABS tablet Take 1 tablet (5 mg total) by mouth 2 (two) times daily. 60 tablet 11  . metoprolol succinate (TOPROL-XL) 25 MG 24 hr tablet Take 25 mg by mouth daily.    . Multiple Vitamins-Minerals (WOMENS MULTI PO) Take by mouth.    . pantoprazole (PROTONIX) 20 MG tablet Take 20 mg by mouth daily.    Marland Kitchen senna (SENOKOT) 8.6 MG tablet Take 1 tablet by mouth 3 (three) times daily.    . furosemide (LASIX) 20 MG tablet Take 1 tablet (20 mg total) by mouth daily. 30 tablet 0   No current facility-administered medications for this visit.     Allergies:   Codeine    Social History:  The patient  reports that  she has never smoked. She has never used smokeless tobacco. She reports that she does not drink alcohol or use drugs.   Family History:  The patient's family history includes Stroke (age of onset: 67) in her mother.    ROS:  Please see the history of present illness.   Otherwise, review of systems are positive for none.   All other systems are reviewed and negative.    PHYSICAL EXAM: VS:  BP (!) 156/102 (BP Location: Left Arm)   Pulse 98   Ht 5\' 5"  (1.651 m)   Wt 149 lb 3.2 oz (67.7 kg)   BMI 24.83 kg/m  , BMI Body mass index is 24.83 kg/m. GENERAL:  Frail appearing HEENT:  Pupils equal round and reactive, fundi not visualized, oral mucosa unremarkable NECK:  No jugular venous distention, waveform within normal limits, carotid upstroke brisk and symmetric, no bruits, no  thyromegaly LYMPHATICS:  No cervical, inguinal adenopathy LUNGS:  Clear to auscultation bilaterally BACK:  No CVA tenderness CHEST:  Unremarkable HEART:  PMI not displaced or sustained,S1 and S2 within normal limits, no S3, no clicks, no rubs, 2 out of 6 apical systolic murmur radiating slightly at aortic outflow tract, no diastolic murmurs, irregular ABD:  Flat, positive bowel sounds normal in frequency in pitch, no bruits, no rebound, no guarding, no midline pulsatile mass, no hepatomegaly, no splenomegaly EXT:  2 plus pulses throughout, trace ankle edema, no cyanosis no clubbing SKIN:  No rashes no nodules NEURO:  Cranial nerves II through XII grossly intact, motor grossly intact throughout PSYCH:  Cognitively intact, oriented to person place and time    EKG:  EKG is ordered today. The ekg ordered today demonstrates atrial fibrillation, rate 98, low voltage in the limb leads, axis within normal limits, intervals within normal limits, probable old anteroseptal infarct.   Recent Labs: 05/23/2017: ALT 13; BUN 19; Creatinine 1.31; Hemoglobin 13.9; Platelet Count 330; Potassium 3.3; Sodium 133    Lipid Panel No results found for: CHOL, TRIG, HDL, CHOLHDL, VLDL, LDLCALC, LDLDIRECT    Wt Readings from Last 3 Encounters:  02/18/18 149 lb 3.2 oz (67.7 kg)  08/30/17 148 lb 9.6 oz (67.4 kg)  07/24/17 149 lb 6.4 oz (67.8 kg)      Other studies Reviewed: Additional studies/ records that were reviewed today include: Office records. Review of the above records demonstrates:  Please see elsewhere in the note.     ASSESSMENT AND PLAN:  ATRIAL FIB:   The patient seems to have new onset atrial fibrillation.  I Georgina Peer see if her grandson can send me a screenshot of the strips that he recorded to see if there was intermittent sinus rhythm.  She is can have a 48-hour Holter to try to judge whether this is more persistent or paroxysmal.  According to her creatinine and weight she should be on a 5  mg twice daily Eliquis and I will increase this.  I will stop her aspirin.  Further management will be based on these results.  If it is persistent I probably will not try for rhythm management which would likely require cardioversion.  Her symptoms seem to be somewhat paroxysmal and would not correlate with persistent fibrillation.  If however, it is paroxysmal atrial fibrillation and we can correlate paroxysms with when she is feeling bad she might benefit from a trial of amiodarone.  I would not be considering cardioversion given her advanced age.  If I am considering more than rate control and anticoagulation in  the future I might consider an echocardiogram as she does have a markedly abnormal EKG.  For now we will pursue the most conservative course.  DYSPNEA/COUGH: We will check a BNP level.  In anticipation that she probably has some diastolic dysfunction explaining some of her coughing increased dyspnea I am to give her just 2 days of Lasix to see if she has any symptomatic improvement.   Current medicines are reviewed at length with the patient today.  The patient does not have concerns regarding medicines.  The following changes have been made:  no change  Labs/ tests ordered today include:   Orders Placed This Encounter  Procedures  . B Nat Peptide  . HOLTER MONITOR - 48 HOUR  . EKG 12-Lead     Disposition:   FU with me after the Holter    Signed, Minus Breeding, MD  02/18/2018 1:36 PM    [facility]

## 2018-02-18 ENCOUNTER — Ambulatory Visit: Payer: Medicare Other | Admitting: Cardiology

## 2018-02-18 ENCOUNTER — Encounter: Payer: Self-pay | Admitting: Cardiology

## 2018-02-18 VITALS — BP 156/102 | HR 98 | Ht 65.0 in | Wt 149.2 lb

## 2018-02-18 DIAGNOSIS — R531 Weakness: Secondary | ICD-10-CM

## 2018-02-18 DIAGNOSIS — I4891 Unspecified atrial fibrillation: Secondary | ICD-10-CM

## 2018-02-18 DIAGNOSIS — R0602 Shortness of breath: Secondary | ICD-10-CM | POA: Diagnosis not present

## 2018-02-18 MED ORDER — APIXABAN 5 MG PO TABS: 5.0000 mg | ORAL_TABLET | Freq: Two times a day (BID) | ORAL | 11 refills | Status: DC

## 2018-02-18 MED ORDER — FUROSEMIDE 20 MG PO TABS: 20.0000 mg | ORAL_TABLET | Freq: Every day | ORAL | 0 refills | Status: DC

## 2018-02-18 NOTE — Patient Instructions (Signed)
Medication Instructions:  INCREASE- Eliquis 5 mg twice a day START-Lasix 20 mg take 1 tablet for 2 days  If you need a refill on your cardiac medications before your next appointment, please call your pharmacy.   Lab work: BNP Today  If you have labs (blood work) drawn today and your tests are completely normal, you will receive your results only by: Marland Kitchen MyChart Message (if you have MyChart) OR . A paper copy in the mail If you have any lab test that is abnormal or we need to change your treatment, we will call you to review the results.  Testing/Procedures: Your physician has recommended that you wear a holter monitor for 48 hours. Holter monitors are medical devices that record the heart's electrical activity. Doctors most often use these monitors to diagnose arrhythmias. Arrhythmias are problems with the speed or rhythm of the heartbeat. The monitor is a small, portable device. You can wear one while you do your normal daily activities. This is usually used to diagnose what is causing palpitations/syncope (passing out).   Follow-Up: At [facility], you and your health needs are our priority.  As part of our continuing mission to provide you with exceptional heart care, we have created designated Provider Care Teams.  These Care Teams include your primary Cardiologist (physician) and Advanced Practice Providers (APPs -  Physician Assistants and Nurse Practitioners) who all work together to provide you with the care you need, when you need it. You will need a follow up appointment in 2 weeks.  Please call our office 2 months in advance to schedule this appointment.  You may see Dr Percival Spanish or one of the following Advanced Practice Providers on your designated Care Team:   Rosaria Ferries, PA-C . Jory Sims, DNP, ANP

## 2018-02-19 LAB — BRAIN NATRIURETIC PEPTIDE: BNP: 885.3 pg/mL — ABNORMAL HIGH (ref 0.0–100.0)

## 2018-02-21 ENCOUNTER — Telehealth: Payer: Self-pay | Admitting: Cardiology

## 2018-02-21 NOTE — Telephone Encounter (Signed)
Spoke with pt daughter [name]. [name] is aware of the pt BNP results with verbal understanding. Results fwd to pcp. Notes recorded by Minus Breeding, MD on 02/20/2018 at 12:58 PM EST Elevated as above. She was given a couple of doses of diuretic. No other change in therapy. Call Ms. Issa with the results and send results to Lawerance Cruel, MD

## 2018-02-21 NOTE — Telephone Encounter (Signed)
New Message   Pt's daughter is returning call for nurse, daughter is requesting to be contacted between 10-1:30 due to work. Please call

## 2018-02-25 ENCOUNTER — Ambulatory Visit (INDEPENDENT_AMBULATORY_CARE_PROVIDER_SITE_OTHER): Payer: Medicare Other

## 2018-02-25 DIAGNOSIS — I4891 Unspecified atrial fibrillation: Secondary | ICD-10-CM | POA: Diagnosis not present

## 2018-02-28 ENCOUNTER — Ambulatory Visit: Payer: Medicare Other | Admitting: Cardiology

## 2018-03-04 NOTE — Progress Notes (Deleted)
Cardiology Office Note   Date:  03/04/2018   ID:  [name], DOB [birth year], MRN [id number]  PCP:  Lawerance Cruel, MD  Cardiologist:  Hochrein  No chief complaint on file.    History of Present Illness: [name] is a 85 y.o. female who presents for ongoing assessment and management of atrial fib. She is unaware of irregular HR when this occurs.  She is on Eliquis 5 mg BID and ASA was stopped when she saw Dr. Percival Spanish on 02/18/2018. She had a Holter monitor placed which revealed atrial fib, poorly controlled rate and rare ventricular ectopy. She was not considered for DCCV given her advanced age.     Past Medical History:  Diagnosis Date  . Arthritis   . Breast cancer ([place])   . Chronic back pain   . GERD (gastroesophageal reflux disease)   . History of radiation therapy 08/10/17- 09/07/17   Left Breast 28.50 Gy in 5 fractions given on a weekly basis.   . Hypertension   . UTI (urinary tract infection)     Past Surgical History:  Procedure Laterality Date  . ABDOMINAL HYSTERECTOMY     she still has one ovary in place.   Marland Kitchen BREAST LUMPECTOMY    . BREAST LUMPECTOMY WITH RADIOACTIVE SEED LOCALIZATION Left 06/22/2017   Procedure: BREAST LUMPECTOMY WITH RADIOACTIVE SEED LOCALIZATION;  Surgeon: [name], MD;  Location: [place];  Service: General;  Laterality: Left;  . CHOLECYSTECTOMY    . HERNIA REPAIR       Current Outpatient Medications  Medication Sig Dispense Refill  . amitriptyline (ELAVIL) 25 MG tablet   0  . apixaban (ELIQUIS) 5 MG TABS tablet Take 1 tablet (5 mg total) by mouth 2 (two) times daily. 60 tablet 11  . furosemide (LASIX) 20 MG tablet Take 1 tablet (20 mg total) by mouth daily. 30 tablet 0  . metoprolol succinate (TOPROL-XL) 25 MG 24 hr tablet Take 25 mg by mouth daily.    . Multiple Vitamins-Minerals (WOMENS MULTI PO) Take by mouth.    . pantoprazole (PROTONIX) 20 MG tablet Take 20 mg by mouth daily.    Marland Kitchen senna (SENOKOT)  8.6 MG tablet Take 1 tablet by mouth 3 (three) times daily.     No current facility-administered medications for this visit.     Allergies:   Codeine    Social History:  The patient  reports that she has never smoked. She has never used smokeless tobacco. She reports that she does not drink alcohol or use drugs.   Family History:  The patient's family history includes Stroke (age of onset: 39) in her mother.    ROS: All other systems are reviewed and negative. Unless otherwise mentioned in H&P    PHYSICAL EXAM: VS:  There were no vitals taken for this visit. , BMI There is no height or weight on file to calculate BMI. GEN: Well nourished, well developed, in no acute distress HEENT: normal Neck: no JVD, carotid bruits, or masses Cardiac: ***RRR; no murmurs, rubs, or gallops,no edema  Respiratory:  Clear to auscultation bilaterally, normal work of breathing GI: soft, nontender, nondistended, + BS MS: no deformity or atrophy Skin: warm and dry, no rash Neuro:  Strength and sensation are intact Psych: euthymic mood, full affect   EKG:  EKG {ACTION; IS/IS [id number]} ordered today. The ekg ordered today demonstrates ***   Recent Labs: 05/23/2017: ALT 13; BUN 19; Creatinine 1.31; Hemoglobin 13.9; Platelet Count 330; Potassium  3.3; Sodium 133 02/18/2018: BNP 885.3    Lipid Panel No results found for: CHOL, TRIG, HDL, CHOLHDL, VLDL, LDLCALC, LDLDIRECT    Wt Readings from Last 3 Encounters:  02/18/18 149 lb 3.2 oz (67.7 kg)  08/30/17 148 lb 9.6 oz (67.4 kg)  07/24/17 149 lb 6.4 oz (67.8 kg)      Other studies Reviewed:   ASSESSMENT AND PLAN:  1.  ***   Current medicines are reviewed at length with the patient today.    Labs/ tests ordered today include: *** Phill Myron. West Pugh, ANP, AACC   03/04/2018 7:45 AM    [facility] [place] [address] Office [phone] Fax [phone]

## 2018-03-05 ENCOUNTER — Ambulatory Visit: Payer: Medicare Other | Admitting: Adult Health

## 2018-03-15 ENCOUNTER — Telehealth: Payer: Self-pay | Admitting: *Deleted

## 2018-03-15 MED ORDER — METOPROLOL SUCCINATE ER 50 MG PO TB24: 50.0000 mg | ORAL_TABLET | Freq: Every day | ORAL | 3 refills | Status: DC

## 2018-03-15 NOTE — Telephone Encounter (Signed)
-----   Message from Minus Breeding, MD sent at 03/10/2018 10:42 AM EST ----- Increase her beta blocker to 50 mg XL daily metoprolol.  Ask her family to keep a recording of her daytime heart rate after the increase so I can judge rate control.  Call Ms. Mathisen with the results and send results to Lawerance Cruel, MD

## 2018-03-15 NOTE — Telephone Encounter (Signed)
Spoke with pt daughter(DPR), Toprol 50 mg was send into pt pharmacy and pt daughter was advised to keep a daily heart rate check through out the day.

## 2018-03-18 NOTE — Progress Notes (Signed)
Cardiology Office Note   Date:  03/19/2018   ID:  [name], DOB January 13, 1921, MRN [id number]  PCP:  Lawerance Cruel, MD  Cardiologist: Hochrein  No chief complaint on file.    History of Present Illness: [name] is a 85 y.o. female who presents for ongoing assessment and management of atrial fibrillation. She remains on apixaban and metoprolol. Holter revealed atrial fib with ventricular ectopy with poorly controlled rate. Metoprolol dose was increased to 50 mg daily. She is not considered at candidate for DCCV due to her age. Consider echo on follow up appt today,   She comes today very frail, using a walker for ambulation. She has her daughter with her.     She has felt better on higher doses of metoprolol. She has also recently has been treated for pneumonia. She has recovered from this. She is taking all of her medications as directed. She denies chest pain, bleeding, DOE, or palpitations.   Past Medical History:  Diagnosis Date  . Arthritis   . Breast cancer ([place])   . Chronic back pain   . GERD (gastroesophageal reflux disease)   . History of radiation therapy 08/10/17- 09/07/17   Left Breast 28.50 Gy in 5 fractions given on a weekly basis.   . Hypertension   . UTI (urinary tract infection)     Past Surgical History:  Procedure Laterality Date  . ABDOMINAL HYSTERECTOMY     she still has one ovary in place.   Marland Kitchen BREAST LUMPECTOMY    . BREAST LUMPECTOMY WITH RADIOACTIVE SEED LOCALIZATION Left 06/22/2017   Procedure: BREAST LUMPECTOMY WITH RADIOACTIVE SEED LOCALIZATION;  Surgeon: [name], MD;  Location: [place];  Service: General;  Laterality: Left;  . CHOLECYSTECTOMY    . HERNIA REPAIR       Current Outpatient Medications  Medication Sig Dispense Refill  . amitriptyline (ELAVIL) 25 MG tablet   0  . apixaban (ELIQUIS) 5 MG TABS tablet Take 1 tablet (5 mg total) by mouth 2 (two) times daily. 60 tablet 11  . furosemide (LASIX) 20 MG  tablet Take 1 tablet (20 mg total) by mouth daily. 30 tablet 0  . metoprolol succinate (TOPROL-XL) 50 MG 24 hr tablet Take 1 tablet (50 mg total) by mouth daily. 90 tablet 3  . Multiple Vitamins-Minerals (WOMENS MULTI PO) Take by mouth.    . pantoprazole (PROTONIX) 20 MG tablet Take 20 mg by mouth daily.    Marland Kitchen senna (SENOKOT) 8.6 MG tablet Take 1 tablet by mouth 3 (three) times daily.     No current facility-administered medications for this visit.     Allergies:   Codeine    Social History:  The patient  reports that she has never smoked. She has never used smokeless tobacco. She reports that she does not drink alcohol or use drugs.   Family History:  The patient's family history includes Stroke (age of onset: 85) in her mother.    ROS: All other systems are reviewed and negative. Unless otherwise mentioned in H&P    PHYSICAL EXAM: VS:  BP (!) 150/90   Pulse 63   Wt 142 lb 12.8 oz (64.8 kg)   BMI 23.76 kg/m  , BMI Body mass index is 23.76 kg/m. GEN: Well nourished, well developed, in no acute distress HEENT: normal Neck: no JVD, carotid bruits, or masses Cardiac: IRRR; no murmurs, rubs, or gallops,no edema  Respiratory:  Clear to auscultation bilaterally, normal work of breathing GI:  soft, nontender, nondistended, + BS MS: no deformity or atrophy Skin: warm and dry, no rash Neuro:  Strength and sensation are intact Psych: euthymic mood, full affect   EKG:  Not completed this office visit.   Recent Labs: 05/23/2017: ALT 13; BUN 19; Creatinine 1.31; Hemoglobin 13.9; Platelet Count 330; Potassium 3.3; Sodium 133 02/18/2018: BNP 885.3    Lipid Panel No results found for: CHOL, TRIG, HDL, CHOLHDL, VLDL, LDLCALC, LDLDIRECT    Wt Readings from Last 3 Encounters:  03/19/18 142 lb 12.8 oz (64.8 kg)  02/18/18 149 lb 3.2 oz (67.7 kg)  08/30/17 148 lb 9.6 oz (67.4 kg)      Other studies Reviewed:   Holter Monitor 02/25/2018. Atrial fib,  Rate poorly  controlled Ventricular ectopy Rare ventricular ectopy  ASSESSMENT AND PLAN:  1.  Atrial fib with RVR: Rate is significantly improved with increase dose of metoprolol. She offers no complaints of dizziness, worsening chest pain, or bleeding on Eliquis. I will not make any changes at this time as she is stable and as responded well to her current regimen. She requests not to be seen so often now that things are better as she is having chronic back pain making ambulation very difficult.   2. Chronic back pain: She is followed by primary care. I will have her come back in 6 months as she is stable to prevent frequent appointments. Her daughter is offered the My Chart application to assist with communication or need for follow up sooner than 6 months.    Current medicines are reviewed at length with the patient today.    Labs/ tests ordered today include: None  Phill Myron. West Pugh, ANP, [place]   03/19/2018 11:18 AM    [place] [address] Office [phone] Fax [phone]

## 2018-03-19 ENCOUNTER — Encounter: Payer: Self-pay | Admitting: Adult Health

## 2018-03-19 ENCOUNTER — Ambulatory Visit: Payer: Medicare Other | Admitting: Adult Health

## 2018-03-19 VITALS — BP 150/90 | HR 63 | Wt 142.8 lb

## 2018-03-19 DIAGNOSIS — I4821 Permanent atrial fibrillation: Secondary | ICD-10-CM | POA: Diagnosis not present

## 2018-03-19 NOTE — Patient Instructions (Signed)
Follow-Up: You will need a follow up appointment in 6 months.  Please call our office 2 months in advance (June 2020) to schedule this (AUGUST 2020) appointment.  You may see Minus Breeding, MD Jory Sims, DNP, [place]  or one of the following Advanced Practice Providers on your designated Care Team:  Jory Sims, DNP, AACC Rosaria Ferries, PA-C  Medication Instructions:  NO CHANGES- Your physician recommends that you continue on your current medications as directed. Please refer to the Current Medication list given to you today. If you need a refill on your cardiac medications before your next appointment, please call your pharmacy. Labwork: When you have labs (blood work) and your tests are completely normal, you will receive your results ONLY by [place] (if you have MyChart) -OR- A paper copy in the mail.  At [facility], you and your health needs are our priority.  As part of our continuing mission to provide you with exceptional heart care, we have created designated Provider Care Teams.  These Care Teams include your primary Cardiologist (physician) and Advanced Practice Providers (APPs -  Physician Assistants and Nurse Practitioners) who all work together to provide you with the care you need, when you need it.  Thank you for choosing [facility] at [facility]!!

## 2018-05-01 ENCOUNTER — Telehealth: Payer: Self-pay | Admitting: Oncology

## 2018-05-01 NOTE — Telephone Encounter (Signed)
Patient called to cancel °

## 2018-06-05 ENCOUNTER — Ambulatory Visit: Payer: Medicare Other | Admitting: Oncology

## 2018-10-27 DIAGNOSIS — I517 Cardiomegaly: Secondary | ICD-10-CM | POA: Insufficient documentation

## 2018-10-27 DIAGNOSIS — I4821 Permanent atrial fibrillation: Secondary | ICD-10-CM | POA: Insufficient documentation

## 2018-10-27 NOTE — Progress Notes (Signed)
Cardiology Office Note   Date:  10/29/2018   ID:  [name], DOB 1920-09-05, MRN [id number]  PCP:  Lawerance Cruel, MD  Cardiologist:   Minus Breeding, MD Referring:  Lawerance Cruel, MD  Chief Complaint  Patient presents with  . Atrial Fibrillation      History of Present Illness: [name] is a 85 y.o. female who presents follow up of atrial fib.  She has been on beta-blocker and her heart rate seems to be better controlled.  She does not really notice this.  She has been fatigued.  This might be a little worse than previous.  She is describing some black stools but there is been no recent blood work or stool guaiac.  She has not really noticed palpitations, presyncope or syncope.  She does not have chest pressure, neck or arm discomfort.  She has no weight gain or edema    Past Medical History:  Diagnosis Date  . Arthritis   . Breast cancer ([place])   . Chronic back pain   . GERD (gastroesophageal reflux disease)   . History of radiation therapy 08/10/17- 09/07/17   Left Breast 28.50 Gy in 5 fractions given on a weekly basis.   . Hypertension   . UTI (urinary tract infection)     Past Surgical History:  Procedure Laterality Date  . ABDOMINAL HYSTERECTOMY     she still has one ovary in place.   Marland Kitchen BREAST LUMPECTOMY    . BREAST LUMPECTOMY WITH RADIOACTIVE SEED LOCALIZATION Left 06/22/2017   Procedure: BREAST LUMPECTOMY WITH RADIOACTIVE SEED LOCALIZATION;  Surgeon: [name], MD;  Location: [place];  Service: General;  Laterality: Left;  . CHOLECYSTECTOMY    . HERNIA REPAIR       Current Outpatient Medications  Medication Sig Dispense Refill  . amitriptyline (ELAVIL) 25 MG tablet   0  . apixaban (ELIQUIS) 5 MG TABS tablet Take 1 tablet (5 mg total) by mouth 2 (two) times daily. 60 tablet 11  . furosemide (LASIX) 20 MG tablet Take 1 tablet (20 mg total) by mouth daily. 30 tablet 0  . metoprolol succinate (TOPROL-XL) 50 MG 24 hr  tablet Take 1 tablet (50 mg total) by mouth daily. 90 tablet 3  . Multiple Vitamins-Minerals (WOMENS MULTI PO) Take by mouth.    . pantoprazole (PROTONIX) 20 MG tablet Take 20 mg by mouth daily.    Marland Kitchen senna (SENOKOT) 8.6 MG tablet Take 1 tablet by mouth 3 (three) times daily.     No current facility-administered medications for this visit.     Allergies:   Codeine   ROS:  Please see the history of present illness.   Otherwise, review of systems are positive for none.   All other systems are reviewed and negative.    PHYSICAL EXAM: VS:  Temp (!) 93.7 F (34.3 C)   Ht 5\' 5"  (1.651 m)   Wt 132 lb (59.9 kg)   SpO2 94%   BMI 21.97 kg/m  , BMI Body mass index is 21.97 kg/m. GENERAL: Frail appearing NECK:  No jugular venous distention, waveform within normal limits, carotid upstroke brisk and symmetric, no bruits, no thyromegaly LUNGS:  Clear to auscultation bilaterally CHEST:  Unremarkable HEART:  PMI not displaced or sustained,S1 and S2 within normal limits, no S3, no clicks, no rubs, 2 out of 6 apical systolic murmur radiating slightly to the axilla, no diastolic murmurs ABD:  Flat, positive bowel sounds normal in  frequency in pitch, no bruits, no rebound, no guarding, no midline pulsatile mass, no hepatomegaly, no splenomegaly EXT:  2 plus pulses throughout, no edema, no cyanosis no clubbing   EKG:  EKG is not ordered today. NA  Recent Labs: 02/18/2018: BNP 885.3    Lipid Panel No results found for: CHOL, TRIG, HDL, CHOLHDL, VLDL, LDLCALC, LDLDIRECT    Wt Readings from Last 3 Encounters:  10/29/18 132 lb (59.9 kg)  03/19/18 142 lb 12.8 oz (64.8 kg)  02/18/18 149 lb 3.2 oz (67.7 kg)      Other studies Reviewed: Additional studies/ records that were reviewed today include: None Review of the above records demonstrates:  Please see elsewhere in the note.     ASSESSMENT AND PLAN:  ATRIAL FIB:     I may check a CBC.  There is a vague description of black stools.  I talked  to her daughter about this.  If she is not anemic I will elect to continue her blood thinner but she will need to have close follow-up of her CBCs.  She seems to have good rate control.   MODERATE LVH:  I will manage this conservatively.      Current medicines are reviewed at length with the patient today.  The patient does not have concerns regarding medicines.  The following changes have been made:  None  Labs/ tests ordered today include:   Orders Placed This Encounter  Procedures  . CBC with Differential/Platelet     Disposition:   FU Jory Sims DNP in six months.       Signed, Minus Breeding, MD  10/29/2018 3:51 PM    [facility]

## 2018-10-29 ENCOUNTER — Ambulatory Visit (INDEPENDENT_AMBULATORY_CARE_PROVIDER_SITE_OTHER): Payer: Medicare Other | Admitting: Cardiology

## 2018-10-29 ENCOUNTER — Encounter: Payer: Self-pay | Admitting: Cardiology

## 2018-10-29 ENCOUNTER — Other Ambulatory Visit: Payer: Self-pay

## 2018-10-29 VITALS — BP 110/70 | Temp 93.7°F | Ht 65.0 in | Wt 132.0 lb

## 2018-10-29 DIAGNOSIS — I517 Cardiomegaly: Secondary | ICD-10-CM

## 2018-10-29 DIAGNOSIS — Z5181 Encounter for therapeutic drug level monitoring: Secondary | ICD-10-CM | POA: Diagnosis not present

## 2018-10-29 DIAGNOSIS — I4821 Permanent atrial fibrillation: Secondary | ICD-10-CM

## 2018-10-29 NOTE — Patient Instructions (Signed)
Medication Instructions:  Your physician recommends that you continue on your current medications as directed. Please refer to the Current Medication list given to you today.  If you need a refill on your cardiac medications before your next appointment, please call your pharmacy.   Lab work: CBC TODAY   If you have labs (blood work) drawn today and your tests are completely normal, you will receive your results only by: Marland Kitchen MyChart Message (if you have MyChart) OR . A paper copy in the mail If you have any lab test that is abnormal or we need to change your treatment, we will call you to review the results.  Testing/Procedures: NONE   Follow-Up: Your physician wants you to follow-up in: [place] will receive a reminder letter in the mail two months in advance. If you don't receive a letter, please call our office to schedule the follow-up appointment.

## 2018-10-30 LAB — CBC WITH DIFFERENTIAL/PLATELET
Basophils Absolute: 0 10*3/uL (ref 0.0–0.2)
Basos: 0 %
EOS (ABSOLUTE): 0.1 10*3/uL (ref 0.0–0.4)
Eos: 2 %
Hematocrit: 39.7 % (ref 34.0–46.6)
Hemoglobin: 13.6 g/dL (ref 11.1–15.9)
Immature Grans (Abs): 0 10*3/uL (ref 0.0–0.1)
Immature Granulocytes: 0 %
Lymphocytes Absolute: 2.1 10*3/uL (ref 0.7–3.1)
Lymphs: 28 %
MCH: 32.1 pg (ref 26.6–33.0)
MCHC: 34.3 g/dL (ref 31.5–35.7)
MCV: 94 fL (ref 79–97)
Monocytes Absolute: 0.7 10*3/uL (ref 0.1–0.9)
Monocytes: 9 %
Neutrophils Absolute: 4.6 10*3/uL (ref 1.4–7.0)
Neutrophils: 61 %
Platelets: 247 10*3/uL (ref 150–450)
RBC: 4.24 x10E6/uL (ref 3.77–5.28)
RDW: 12.6 % (ref 11.7–15.4)
WBC: 7.6 10*3/uL (ref 3.4–10.8)

## 2018-12-24 ENCOUNTER — Other Ambulatory Visit: Payer: Self-pay | Admitting: Cardiology

## 2019-03-01 ENCOUNTER — Other Ambulatory Visit: Payer: Self-pay

## 2019-03-01 ENCOUNTER — Emergency Department (HOSPITAL_COMMUNITY): Payer: Medicare Other

## 2019-03-01 ENCOUNTER — Encounter (HOSPITAL_COMMUNITY): Payer: Self-pay

## 2019-03-01 ENCOUNTER — Emergency Department (HOSPITAL_COMMUNITY)
Admission: EM | Admit: 2019-03-01 | Discharge: 2019-03-02 | Disposition: A | Discharge status: Home / Self Care | Payer: Medicare Other | Attending: Emergency Medicine | Admitting: Emergency Medicine

## 2019-03-01 DIAGNOSIS — Y999 Unspecified external cause status: Secondary | ICD-10-CM | POA: Diagnosis not present

## 2019-03-01 DIAGNOSIS — R531 Weakness: Secondary | ICD-10-CM

## 2019-03-01 DIAGNOSIS — Y92002 Bathroom of unspecified non-institutional (private) residence single-family (private) house as the place of occurrence of the external cause: Secondary | ICD-10-CM | POA: Insufficient documentation

## 2019-03-01 DIAGNOSIS — Z7901 Long term (current) use of anticoagulants: Secondary | ICD-10-CM | POA: Diagnosis not present

## 2019-03-01 DIAGNOSIS — N390 Urinary tract infection, site not specified: Secondary | ICD-10-CM | POA: Insufficient documentation

## 2019-03-01 DIAGNOSIS — W010XXA Fall on same level from slipping, tripping and stumbling without subsequent striking against object, initial encounter: Secondary | ICD-10-CM | POA: Diagnosis not present

## 2019-03-01 DIAGNOSIS — Z79899 Other long term (current) drug therapy: Secondary | ICD-10-CM | POA: Insufficient documentation

## 2019-03-01 DIAGNOSIS — Y939 Activity, unspecified: Secondary | ICD-10-CM | POA: Diagnosis not present

## 2019-03-01 DIAGNOSIS — I10 Essential (primary) hypertension: Secondary | ICD-10-CM | POA: Insufficient documentation

## 2019-03-01 DIAGNOSIS — U071 COVID-19: Secondary | ICD-10-CM | POA: Diagnosis not present

## 2019-03-01 DIAGNOSIS — W19XXXA Unspecified fall, initial encounter: Secondary | ICD-10-CM

## 2019-03-01 LAB — CBC WITH DIFFERENTIAL/PLATELET
Abs Immature Granulocytes: 0.04 10*3/uL (ref 0.00–0.07)
Basophils Absolute: 0 10*3/uL (ref 0.0–0.1)
Basophils Relative: 0 %
Eosinophils Absolute: 0 10*3/uL (ref 0.0–0.5)
Eosinophils Relative: 0 %
HCT: 47.9 % — ABNORMAL HIGH (ref 36.0–46.0)
Hemoglobin: 16 g/dL — ABNORMAL HIGH (ref 12.0–15.0)
Immature Granulocytes: 0 %
Lymphocytes Relative: 18 %
Lymphs Abs: 1.6 10*3/uL (ref 0.7–4.0)
MCH: 32.3 pg (ref 26.0–34.0)
MCHC: 33.4 g/dL (ref 30.0–36.0)
MCV: 96.6 fL (ref 80.0–100.0)
Monocytes Absolute: 1 10*3/uL (ref 0.1–1.0)
Monocytes Relative: 11 %
Neutro Abs: 6.4 10*3/uL (ref 1.7–7.7)
Neutrophils Relative %: 71 %
Platelets: 225 10*3/uL (ref 150–400)
RBC: 4.96 MIL/uL (ref 3.87–5.11)
RDW: 13.5 % (ref 11.5–15.5)
WBC: 9.1 10*3/uL (ref 4.0–10.5)
nRBC: 0 % (ref 0.0–0.2)

## 2019-03-01 LAB — BASIC METABOLIC PANEL
Anion gap: 13 (ref 5–15)
BUN: 27 mg/dL — ABNORMAL HIGH (ref 8–23)
CO2: 27 mmol/L (ref 22–32)
Calcium: 9.5 mg/dL (ref 8.9–10.3)
Chloride: 98 mmol/L (ref 98–111)
Creatinine, Ser: 1.22 mg/dL — ABNORMAL HIGH (ref 0.44–1.00)
GFR calc Af Amer: 43 mL/min — ABNORMAL LOW (ref >60)
GFR calc non Af Amer: 37 mL/min — ABNORMAL LOW (ref >60)
Glucose, Bld: 143 mg/dL — ABNORMAL HIGH (ref 70–99)
Potassium: 4.3 mmol/L (ref 3.5–5.1)
Sodium: 138 mmol/L (ref 135–145)

## 2019-03-01 LAB — MAGNESIUM: Magnesium: 1.7 mg/dL (ref 1.7–2.4)

## 2019-03-01 MED ORDER — METOPROLOL TARTRATE 5 MG/5ML IV SOLN
2.5000 mg | Freq: Once | INTRAVENOUS | Status: AC
  Administered 2019-03-01: 2.5 mg via INTRAVENOUS
  Filled 2019-03-01: qty 5

## 2019-03-01 NOTE — ED Notes (Signed)
Rectal temp 97.2 Warm blankets applied.

## 2019-03-01 NOTE — ED Provider Notes (Signed)
Fall out of bed, too weak for family to get back into bed No suspected injury Family requests full evaluation for increased fatigue Imaging negative Labs pending  Anticipate discharge to family Daughter, [name], is caregiver. Attempted to contact Cindy at 1:15 to inform her that patient has UTI. IV Rocephin ordered. Will be able to discharge home when family is contacted.   2:30 - spoke to Pitkin (Cindy's daughter-in-law). Updated her on patient's diagnosis and condition. It is felt she can be discharged home. Rip Harbour agrees - given hold on admissions and COVID environment, patient is best treated at home if possible. Family available to assist in care. She can be discharged home per plan of previous treatment team. She already has antibiotics at home for UTI treatment.    Charlann Lange, PA-C 03/02/19 0304    Virgel Manifold, MD 03/02/19 [phone]

## 2019-03-01 NOTE — ED Triage Notes (Addendum)
Patient arrived via [facility] after a fall. No obvious injury or complaints at this time. Patient has a history of dementia, lives with daughter who appears may be sick per [facility]. Per EMS family states she is on antibiotics now for UTI, also has a history of Afib.

## 2019-03-01 NOTE — ED Provider Notes (Signed)
[facility] DEPT Provider Note   CSN: [id number] Arrival date & time: 03/01/19  1858     History Chief Complaint  Patient presents with  . Fall    Possible Covid-19    [name] is a 86 y.o. female.  HPI   86 year old female coming from home for evaluation after a fall.  History is from her daughter [name] whom patient lives with.  This evening patient fell in the bathroom.  It was unwitnessed.  Did not appear to sustain any injuries from this fall.  Even with the help of several family members they were unable to get her up back into bed though so they called EMS.  Her daughter reports that in the last week or so she has been more tired than normal.  Sleeping much of the day.  She has not specifically complained of anything though.  Nursing note reviewed.  Her daughter states that she is not currently being treated for an urinary tract infection.  Her daughter has antibiotics on hand if needed but patient has not been complaining of any dysuria or other urinary symptoms so is currently not taking them.   Past Medical History:  Diagnosis Date  . Arthritis   . Breast cancer ([place])   . Chronic back pain   . GERD (gastroesophageal reflux disease)   . History of radiation therapy 08/10/17- 09/07/17   Left Breast 28.50 Gy in 5 fractions given on a weekly basis.   . Hypertension   . UTI (urinary tract infection)     Patient Active Problem List   Diagnosis Date Noted  . Permanent atrial fibrillation ([place]) 10/27/2018  . LVH (left ventricular hypertrophy) 10/27/2018  . 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 ABDOMINAL HYSTERECTOMY     she still has one ovary in place.   Marland Kitchen BREAST LUMPECTOMY    . BREAST LUMPECTOMY WITH RADIOACTIVE SEED LOCALIZATION Left 06/22/2017   Procedure: BREAST LUMPECTOMY WITH RADIOACTIVE SEED LOCALIZATION;   Surgeon: [name], MD;  Location: [facility];  Service: General;  Laterality: Left;  . CHOLECYSTECTOMY    . HERNIA REPAIR       OB History   No obstetric history on file.     Family History  Problem Relation Age of Onset  . Stroke Mother 86    Social History   Tobacco Use  . Smoking status: Never Smoker  . Smokeless tobacco: Never Used  Substance Use Topics  . Alcohol use: No  . Drug use: No    Home Medications Prior to Admission medications   Medication Sig Start Date End Date Taking? Authorizing Provider  amitriptyline (ELAVIL) 25 MG tablet  04/11/17   [provider]  apixaban (ELIQUIS) 5 MG TABS tablet Take 1 tablet (5 mg total) by mouth 2 (two) times daily. 02/18/18   Minus Breeding, MD  furosemide (LASIX) 20 MG tablet Take 1 tablet by mouth once daily 12/24/18   Minus Breeding, MD  metoprolol succinate (TOPROL-XL) 50 MG 24 hr tablet Take 1 tablet by mouth once daily 12/24/18   Minus Breeding, MD  Multiple Vitamins-Minerals (WOMENS MULTI PO) Take by mouth.    [provider]  pantoprazole (PROTONIX) 20 MG tablet Take 20 mg by mouth daily.    [provider]  senna (SENOKOT) 8.6 MG tablet Take 1 tablet by mouth 3 (three) times  daily.    [provider]    Allergies    Codeine  Review of Systems   Review of Systems All systems reviewed and negative, other than as noted in HPI.  Physical Exam Updated Vital Signs BP (!) 149/106   Pulse 74   Temp (!) 97.2 F (36.2 C) (Rectal)   Resp (!) 22   SpO2 95%   Physical Exam Vitals and nursing note reviewed.  Constitutional:      General: She is not in acute distress.    Appearance: She is well-developed.  HENT:     Head: Normocephalic and atraumatic.  Eyes:     Comments: Left eye conjunctivitis  Cardiovascular:     Rate and Rhythm: Tachycardia present.     Heart sounds: Normal heart sounds. No murmur. No friction rub. No gallop.      Comments: Mild  tachycardia.  Irregularly irregular. Pulmonary:     Effort: Pulmonary effort is normal. No respiratory distress.     Breath sounds: Normal breath sounds.  Abdominal:     General: There is no distension.     Palpations: Abdomen is soft.     Tenderness: There is no abdominal tenderness.  Musculoskeletal:        General: No tenderness.     Cervical back: Neck supple.     Comments: No midline spinal tenderness.  Range of motion of large joints without apparent discomfort.  Skin:    General: Skin is warm and dry.  Neurological:     Mental Status: She is alert.  Psychiatric:     Comments: Oriented to self only.  Following simple commands.  Moving all extremities equally.  No focal deficits noted.     ED Results / Procedures / Treatments   Labs (all labs ordered are listed, but only abnormal results are displayed) Labs Reviewed  CBC WITH DIFFERENTIAL/PLATELET - Abnormal; Notable for the following components:      Result Value   Hemoglobin 16.0 (*)    HCT 47.9 (*)    All other components within normal limits  BASIC METABOLIC PANEL - Abnormal; Notable for the following components:   Glucose, Bld 143 (*)    BUN 27 (*)    Creatinine, Ser 1.22 (*)    GFR calc non Af Amer 37 (*)    GFR calc Af Amer 43 (*)    All other components within normal limits  URINALYSIS, ROUTINE W REFLEX MICROSCOPIC - Abnormal; Notable for the following components:   Color, Urine AMBER (*)    APPearance HAZY (*)    Hgb urine dipstick LARGE (*)    Ketones, ur 5 (*)    Protein, ur 100 (*)    Leukocytes,Ua TRACE (*)    RBC / HPF >50 (*)    Bacteria, UA MANY (*)    All other components within normal limits  SARS CORONAVIRUS 2 (TAT 6-24 HRS)  MAGNESIUM    EKG EKG Interpretation  Date/Time:  Saturday March 01 2019 19:52:21 EST Ventricular Rate:  124 PR Interval:    QRS Duration: 88 QT Interval:  317 QTC Calculation: 456 R Axis:   33 Text Interpretation: Atrial fibrillation Probable anterior  infarct, age indeterminate Confirmed by Virgel Manifold [id number]) on 03/01/2019 10:05:47 PM   Radiology CT Head Wo Contrast  Result Date: 03/01/2019 CLINICAL DATA:  86 year old female with head trauma. EXAM: CT HEAD WITHOUT CONTRAST CT CERVICAL SPINE WITHOUT CONTRAST TECHNIQUE: Multidetector CT imaging of the head and cervical spine was performed following  the standard protocol without intravenous contrast. Multiplanar CT image reconstructions of the cervical spine were also generated. COMPARISON:  None. FINDINGS: CT HEAD FINDINGS Brain: There is moderate age-related atrophy and chronic microvascular ischemic changes. There is no acute intracranial hemorrhage. No mass effect or midline shift. No extra-axial fluid collection. Vascular: No hyperdense vessel or unexpected calcification. Skull: Normal. Negative for fracture or focal lesion. Sinuses/Orbits: There is diffuse mucoperiosteal thickening paranasal sinuses. No air-fluid level. The mastoid air cells are clear. Other: None CT CERVICAL SPINE FINDINGS Alignment: No acute subluxation. There is grade 1 C7-T1 anterolisthesis Skull base and vertebrae: No acute fracture.  Osteopenia Soft tissues and spinal canal: No prevertebral fluid or swelling. No visible canal hematoma. Disc levels: Multilevel degenerative changes most prominent at C5-C6 with endplate irregularity and disc space narrowing. Upper chest: Negative. Other: Bilateral carotid bulb calcified plaques. IMPRESSION: 1. No acute intracranial hemorrhage. Age-related atrophy and chronic microvascular ischemic changes. 2. No acute/traumatic cervical spine pathology. Degenerative changes. Electronically Signed   By: Anner Crete M.D.   On: 03/01/2019 22:03   CT Cervical Spine Wo Contrast  Result Date: 03/01/2019 CLINICAL DATA:  86 year old female with head trauma. EXAM: CT HEAD WITHOUT CONTRAST CT CERVICAL SPINE WITHOUT CONTRAST TECHNIQUE: Multidetector CT imaging of the head and cervical spine was  performed following the standard protocol without intravenous contrast. Multiplanar CT image reconstructions of the cervical spine were also generated. COMPARISON:  None. FINDINGS: CT HEAD FINDINGS Brain: There is moderate age-related atrophy and chronic microvascular ischemic changes. There is no acute intracranial hemorrhage. No mass effect or midline shift. No extra-axial fluid collection. Vascular: No hyperdense vessel or unexpected calcification. Skull: Normal. Negative for fracture or focal lesion. Sinuses/Orbits: There is diffuse mucoperiosteal thickening paranasal sinuses. No air-fluid level. The mastoid air cells are clear. Other: None CT CERVICAL SPINE FINDINGS Alignment: No acute subluxation. There is grade 1 C7-T1 anterolisthesis Skull base and vertebrae: No acute fracture.  Osteopenia Soft tissues and spinal canal: No prevertebral fluid or swelling. No visible canal hematoma. Disc levels: Multilevel degenerative changes most prominent at C5-C6 with endplate irregularity and disc space narrowing. Upper chest: Negative. Other: Bilateral carotid bulb calcified plaques. IMPRESSION: 1. No acute intracranial hemorrhage. Age-related atrophy and chronic microvascular ischemic changes. 2. No acute/traumatic cervical spine pathology. Degenerative changes. Electronically Signed   By: Anner Crete M.D.   On: 03/01/2019 22:03    Procedures Procedures (including critical care time)  Medications Ordered in ED Medications - No data to display  ED Course  I have reviewed the triage vital signs and the nursing notes.  Pertinent labs & imaging results that were available during my care of the patient were reviewed by me and considered in my medical decision making (see chart for details).    MDM Rules/Calculators/A&P                      86 year old female with what sounds like generalized weakness and a fall earlier today.  Does not appear to sustain any significant injuries from this fall.  Her  neuro exam is nonfocal.  She appears to be at her baseline mental status per her daughter.  Her daughter mostly wanted her checked out.  She does frequently get urinary tract infections but no specific urinary complaints recently.  CT of head and cervical spine without acute abnormality.  Will check basic labs and UA.  She is in A. fib, but has a history of this.    Final Clinical Impression(s) /  ED Diagnoses Final diagnoses:  Generalized weakness  Fall, initial encounter    Rx / DC Orders ED Discharge Orders    None       Virgel Manifold, MD 03/02/19 [phone]

## 2019-03-02 ENCOUNTER — Other Ambulatory Visit: Payer: Self-pay | Admitting: Cardiology

## 2019-03-02 LAB — URINALYSIS, ROUTINE W REFLEX MICROSCOPIC
Bilirubin Urine: NEGATIVE
Glucose, UA: NEGATIVE mg/dL
Ketones, ur: 5 mg/dL — AB
Nitrite: NEGATIVE
Protein, ur: 100 mg/dL — AB
RBC / HPF: >50 RBC/hpf — ABNORMAL HIGH (ref 0–5)
Specific Gravity, Urine: 1.014 (ref 1.005–1.030)
pH: 5 (ref 5.0–8.0)

## 2019-03-02 LAB — SARS CORONAVIRUS 2 (TAT 6-24 HRS): SARS Coronavirus 2: POSITIVE — AB

## 2019-03-02 MED ORDER — METOPROLOL TARTRATE 5 MG/5ML IV SOLN
5.0000 mg | Freq: Once | INTRAVENOUS | Status: AC
  Administered 2019-03-02: 5 mg via INTRAVENOUS
  Filled 2019-03-02: qty 5

## 2019-03-02 MED ORDER — SODIUM CHLORIDE 0.9 % IV SOLN
1.0000 g | Freq: Once | INTRAVENOUS | Status: AC
  Administered 2019-03-02: 1 g via INTRAVENOUS
  Filled 2019-03-02: qty 10

## 2019-03-02 NOTE — Discharge Instructions (Addendum)
There is evidence of a urinary tract infection on labs done in the ED tonight. A COVID test was collected and should result in 6-24 hours.   Please call your doctor to let him know of your ED visit and diagnosis. Follow up should be dictated by him.   If symptoms worsen - any increased weakness, high fever, new symptoms of concern, please return to the emergency department for further urgent intervention.

## 2019-03-02 NOTE — ED Notes (Signed)
Patient assisted into vehicle with family.

## 2019-03-03 ENCOUNTER — Telehealth (HOSPITAL_COMMUNITY): Payer: Self-pay

## 2019-03-03 ENCOUNTER — Telehealth: Payer: Self-pay | Admitting: Nurse Practitioner

## 2019-03-03 NOTE — Telephone Encounter (Signed)
Called to Discuss with patient about Covid symptoms and the use of bamlanivimab, a monoclonal antibody infusion for those with mild to moderate Covid symptoms and at a high risk of hospitalization.     Patient's daughter states that patient would not be able to tolerate 3 hour infusion. Declined at this time. She has an appt scheduled with PCP in the am.

## 2019-03-05 ENCOUNTER — Other Ambulatory Visit: Payer: Self-pay

## 2019-03-05 ENCOUNTER — Inpatient Hospital Stay (HOSPITAL_COMMUNITY)
Admission: EM | Admit: 2019-03-05 | Discharge: 2019-03-10 | DRG: 178 | Disposition: A | Discharge status: Skilled Nursing Facility | Payer: Medicare Other | Attending: Internal Medicine | Admitting: Internal Medicine

## 2019-03-05 ENCOUNTER — Emergency Department (HOSPITAL_COMMUNITY): Payer: Medicare Other

## 2019-03-05 DIAGNOSIS — Z79899 Other long term (current) drug therapy: Secondary | ICD-10-CM

## 2019-03-05 DIAGNOSIS — N390 Urinary tract infection, site not specified: Secondary | ICD-10-CM | POA: Diagnosis present

## 2019-03-05 DIAGNOSIS — E876 Hypokalemia: Secondary | ICD-10-CM | POA: Diagnosis present

## 2019-03-05 DIAGNOSIS — G9349 Other encephalopathy: Secondary | ICD-10-CM | POA: Diagnosis present

## 2019-03-05 DIAGNOSIS — R531 Weakness: Secondary | ICD-10-CM

## 2019-03-05 DIAGNOSIS — K219 Gastro-esophageal reflux disease without esophagitis: Secondary | ICD-10-CM | POA: Diagnosis present

## 2019-03-05 DIAGNOSIS — R627 Adult failure to thrive: Secondary | ICD-10-CM | POA: Diagnosis not present

## 2019-03-05 DIAGNOSIS — U071 COVID-19: Secondary | ICD-10-CM

## 2019-03-05 DIAGNOSIS — Z823 Family history of stroke: Secondary | ICD-10-CM

## 2019-03-05 DIAGNOSIS — E870 Hyperosmolality and hypernatremia: Secondary | ICD-10-CM | POA: Diagnosis present

## 2019-03-05 DIAGNOSIS — Z853 Personal history of malignant neoplasm of breast: Secondary | ICD-10-CM

## 2019-03-05 DIAGNOSIS — F0390 Unspecified dementia without behavioral disturbance: Secondary | ICD-10-CM | POA: Diagnosis present

## 2019-03-05 DIAGNOSIS — I119 Hypertensive heart disease without heart failure: Secondary | ICD-10-CM | POA: Diagnosis present

## 2019-03-05 DIAGNOSIS — M1990 Unspecified osteoarthritis, unspecified site: Secondary | ICD-10-CM | POA: Diagnosis present

## 2019-03-05 DIAGNOSIS — I4821 Permanent atrial fibrillation: Secondary | ICD-10-CM | POA: Diagnosis present

## 2019-03-05 DIAGNOSIS — Z885 Allergy status to narcotic agent status: Secondary | ICD-10-CM

## 2019-03-05 DIAGNOSIS — Z6821 Body mass index (BMI) 21.0-21.9, adult: Secondary | ICD-10-CM

## 2019-03-05 DIAGNOSIS — Z7901 Long term (current) use of anticoagulants: Secondary | ICD-10-CM

## 2019-03-05 DIAGNOSIS — Z66 Do not resuscitate: Secondary | ICD-10-CM

## 2019-03-05 HISTORY — DX: COVID-19: U07.1

## 2019-03-05 LAB — COMPREHENSIVE METABOLIC PANEL
ALT: 18 U/L (ref 0–44)
AST: 29 U/L (ref 15–41)
Albumin: 2.9 g/dL — ABNORMAL LOW (ref 3.5–5.0)
Alkaline Phosphatase: 48 U/L (ref 38–126)
Anion gap: 13 (ref 5–15)
BUN: 51 mg/dL — ABNORMAL HIGH (ref 8–23)
CO2: 28 mmol/L (ref 22–32)
Calcium: 9.4 mg/dL (ref 8.9–10.3)
Chloride: 105 mmol/L (ref 98–111)
Creatinine, Ser: 1.56 mg/dL — ABNORMAL HIGH (ref 0.44–1.00)
GFR calc Af Amer: 32 mL/min — ABNORMAL LOW (ref >60)
GFR calc non Af Amer: 28 mL/min — ABNORMAL LOW (ref >60)
Glucose, Bld: 99 mg/dL (ref 70–99)
Potassium: 3.3 mmol/L — ABNORMAL LOW (ref 3.5–5.1)
Sodium: 146 mmol/L — ABNORMAL HIGH (ref 135–145)
Total Bilirubin: 1.1 mg/dL (ref 0.3–1.2)
Total Protein: 6.1 g/dL — ABNORMAL LOW (ref 6.5–8.1)

## 2019-03-05 LAB — TRIGLYCERIDES: Triglycerides: 123 mg/dL (ref <150)

## 2019-03-05 LAB — CBC WITH DIFFERENTIAL/PLATELET
Abs Immature Granulocytes: 0.07 10*3/uL (ref 0.00–0.07)
Basophils Absolute: 0 10*3/uL (ref 0.0–0.1)
Basophils Relative: 0 %
Eosinophils Absolute: 0.1 10*3/uL (ref 0.0–0.5)
Eosinophils Relative: 1 %
HCT: 41.7 % (ref 36.0–46.0)
Hemoglobin: 13.4 g/dL (ref 12.0–15.0)
Immature Granulocytes: 1 %
Lymphocytes Relative: 34 %
Lymphs Abs: 2.2 10*3/uL (ref 0.7–4.0)
MCH: 32.2 pg (ref 26.0–34.0)
MCHC: 32.1 g/dL (ref 30.0–36.0)
MCV: 100.2 fL — ABNORMAL HIGH (ref 80.0–100.0)
Monocytes Absolute: 0.7 10*3/uL (ref 0.1–1.0)
Monocytes Relative: 11 %
Neutro Abs: 3.3 10*3/uL (ref 1.7–7.7)
Neutrophils Relative %: 53 %
Platelets: 276 10*3/uL (ref 150–400)
RBC: 4.16 MIL/uL (ref 3.87–5.11)
RDW: 13.6 % (ref 11.5–15.5)
WBC: 6.4 10*3/uL (ref 4.0–10.5)
nRBC: 0 % (ref 0.0–0.2)

## 2019-03-05 LAB — LACTATE DEHYDROGENASE: LDH: 164 U/L (ref 98–192)

## 2019-03-05 LAB — D-DIMER, QUANTITATIVE: D-Dimer, Quant: 0.91 ug/mL-FEU — ABNORMAL HIGH (ref 0.00–0.50)

## 2019-03-05 LAB — BRAIN NATRIURETIC PEPTIDE: B Natriuretic Peptide: 440.7 pg/mL — ABNORMAL HIGH (ref 0.0–100.0)

## 2019-03-05 LAB — PROCALCITONIN: Procalcitonin: 0.14 ng/mL

## 2019-03-05 LAB — FIBRINOGEN: Fibrinogen: 511 mg/dL — ABNORMAL HIGH (ref 210–475)

## 2019-03-05 LAB — C-REACTIVE PROTEIN: CRP: 2.1 mg/dL — ABNORMAL HIGH (ref <1.0)

## 2019-03-05 LAB — LACTIC ACID, PLASMA
Lactic Acid, Venous: 1.2 mmol/L (ref 0.5–1.9)
Lactic Acid, Venous: 1.4 mmol/L (ref 0.5–1.9)

## 2019-03-05 LAB — TROPONIN I (HIGH SENSITIVITY)
Troponin I (High Sensitivity): 24 ng/L — ABNORMAL HIGH (ref <18)
Troponin I (High Sensitivity): 22 ng/L — ABNORMAL HIGH (ref <18)

## 2019-03-05 LAB — FERRITIN: Ferritin: 80 ng/mL (ref 11–307)

## 2019-03-05 MED ORDER — POLYETHYLENE GLYCOL 3350 17 G PO PACK: 17.0000 g | PACK | Freq: Every day | ORAL | Status: DC | PRN

## 2019-03-05 MED ORDER — SODIUM CHLORIDE 0.9 % IV SOLN: 250.0000 mL | INTRAVENOUS | Status: DC | PRN

## 2019-03-05 MED ORDER — SODIUM CHLORIDE 0.9 % IV BOLUS
500.0000 mL | Freq: Once | INTRAVENOUS | Status: AC
  Administered 2019-03-05: 500 mL via INTRAVENOUS

## 2019-03-05 MED ORDER — POTASSIUM CHLORIDE IN NACL 20-0.9 MEQ/L-% IV SOLN
INTRAVENOUS | Status: DC
  Filled 2019-03-05 (×2): qty 1000

## 2019-03-05 MED ORDER — APIXABAN 2.5 MG PO TABS
2.5000 mg | ORAL_TABLET | Freq: Two times a day (BID) | ORAL | Status: DC
  Administered 2019-03-06 – 2019-03-10 (×11): 2.5 mg via ORAL
  Filled 2019-03-05 (×12): qty 1

## 2019-03-05 MED ORDER — ACETAMINOPHEN 650 MG RE SUPP: 650.0000 mg | Freq: Four times a day (QID) | RECTAL | Status: DC | PRN

## 2019-03-05 MED ORDER — ACETAMINOPHEN 325 MG PO TABS: 650.0000 mg | ORAL_TABLET | Freq: Four times a day (QID) | ORAL | Status: DC | PRN

## 2019-03-05 MED ORDER — SODIUM CHLORIDE 0.9% FLUSH
3.0000 mL | Freq: Two times a day (BID) | INTRAVENOUS | Status: DC
  Administered 2019-03-06 – 2019-03-10 (×10): 3 mL via INTRAVENOUS

## 2019-03-05 MED ORDER — PANTOPRAZOLE SODIUM 20 MG PO TBEC
20.0000 mg | DELAYED_RELEASE_TABLET | Freq: Every day | ORAL | Status: DC
  Administered 2019-03-06 – 2019-03-10 (×5): 20 mg via ORAL
  Filled 2019-03-05 (×5): qty 1

## 2019-03-05 MED ORDER — AMITRIPTYLINE HCL 50 MG PO TABS
25.0000 mg | ORAL_TABLET | Freq: Every day | ORAL | Status: DC
  Administered 2019-03-06 – 2019-03-10 (×6): 25 mg via ORAL
  Filled 2019-03-05 (×6): qty 1

## 2019-03-05 MED ORDER — SODIUM CHLORIDE 0.9% FLUSH: 3.0000 mL | INTRAVENOUS | Status: DC | PRN

## 2019-03-05 MED ORDER — SENNA 8.6 MG PO TABS
1.0000 | ORAL_TABLET | Freq: Three times a day (TID) | ORAL | Status: DC
  Administered 2019-03-06 – 2019-03-10 (×16): 8.6 mg via ORAL
  Filled 2019-03-05 (×16): qty 1

## 2019-03-05 NOTE — ED Notes (Signed)
[facility] -Granddaughter in [employer]  Cell # [phone]  [facility] Cell # [phone]

## 2019-03-05 NOTE — H&P (Signed)
History and Physical    [name] [id number] DOB: 08-03-1920 DOA: 03/05/2019  PCP: Lawerance Cruel, MD  Patient coming from: Home  I have personally briefly reviewed patient's old medical records in [place]  Chief Complaint: Weakness due to COVID-19  HPI: [name] is a 86 y.o. female with medical history significant of atrial fibrillation on Eliquis, ventricular hypertrophy, breast cancer in the distant past, gastroesophageal reflux disease, and mild dementia who presents from home due to profound weakness.  Home health worker went to see the patient today and found her to be in bed lying in her own urine.  On 118 the patient tested positive for COVID-19 and was sent home in the care of her daughter.  Apparently her daughter is sick and has COVID-19 as well and has been unable to take care of the patient.  We are unsure when the patient last ate or drank anything.  Patient currently denies any pain.  She is stating that she is hungry and thirsty.  Is only oriented to person and that is apparently her baseline. I called and spoke to the patient's daughter who stated that she is sick herself and thinks she might have Covid and that they were exposed by other family members who visited for Christmas.  24 hours after they returned home they were diagnosed with COVID-19.  Subsequently the patient and her daughter both became ill and the patient tested positive on January 18.  ED Course: Inflammatory markers reassuring, patient unable to give any history, referred to [place] due to unsafe discharge plan  Review of Systems: Patient unable to give history  Past Medical History:  Diagnosis Date  . Arthritis   . Breast cancer ([place])   . Chronic back pain   . GERD (gastroesophageal reflux disease)   . History of radiation therapy 08/10/17- 09/07/17   Left Breast 28.50 Gy in 5 fractions given on a weekly basis.   . Hypertension   . UTI (urinary tract infection)     Past  Surgical History:  Procedure Laterality Date  . ABDOMINAL HYSTERECTOMY     she still has one ovary in place.   Marland Kitchen BREAST LUMPECTOMY    . BREAST LUMPECTOMY WITH RADIOACTIVE SEED LOCALIZATION Left 06/22/2017   Procedure: BREAST LUMPECTOMY WITH RADIOACTIVE SEED LOCALIZATION;  Surgeon: [name], MD;  Location: [facility];  Service: General;  Laterality: Left;  . CHOLECYSTECTOMY    . HERNIA REPAIR      Social History   Social History Narrative  . Not on file     reports that she has never smoked. She has never used smokeless tobacco. She reports that she does not drink alcohol or use drugs.  Allergies  Allergen Reactions  . Codeine Nausea And Vomiting    Family History  Problem Relation Age of Onset  . Stroke Mother 46    Prior to Admission medications   Medication Sig Start Date End Date Taking? Authorizing Provider  amitriptyline (ELAVIL) 25 MG tablet Take 25 mg by mouth at bedtime.  04/11/17   [provider]  apixaban (ELIQUIS) 5 MG TABS tablet Take 1 tablet (5 mg total) by mouth 2 (two) times daily. LABS NEEDED FOR FURTHER REFILLS 03/03/19   Minus Breeding, MD  furosemide (LASIX) 20 MG tablet Take 1 tablet by mouth once daily 12/24/18   Minus Breeding, MD  metoprolol succinate (TOPROL-XL) 50 MG 24 hr tablet Take 1 tablet by mouth once daily 12/24/18  Minus Breeding, MD  Multiple Vitamins-Minerals (WOMENS MULTI PO) Take by mouth.    [provider]  pantoprazole (PROTONIX) 20 MG tablet Take 20 mg by mouth daily.    [provider]  senna (SENOKOT) 8.6 MG tablet Take 1 tablet by mouth 3 (three) times daily.    [provider]    Physical Exam:  Constitutional: NAD, calm, comfortable Vitals:   03/05/19 1730 03/05/19 1800 03/05/19 1830 03/05/19 1900  BP: (!) 155/85 119/78 138/81 (!) 143/91  Pulse: 94 94 99 100  Resp: 17 (!) 21 14 15   Temp:      TempSrc:      SpO2: 95% 98% 95% 95%  Weight:  59.9 kg     Eyes:  PERRL, lids and conjunctivae normal ENMT: Mucous membranes are moist. Posterior pharynx clear of any exudate or lesions.Normal dentition.  Neck: normal, supple, no masses, no thyromegaly Respiratory: clear to auscultation bilaterally, no wheezing, no crackles. Normal respiratory effort. No accessory muscle use.  Cardiovascular: Regular rate and rhythm, no murmurs / rubs / gallops. No extremity edema. 2+ pedal pulses. No carotid bruits.  Abdomen: no tenderness, no masses palpated. No hepatosplenomegaly. Bowel sounds positive.  Musculoskeletal: no clubbing / cyanosis. No joint deformity upper and lower extremities. Good ROM, no contractures. Normal muscle tone.  Skin: no rashes, lesions, ulcers. No induration Neurologic: CN 2-12 grossly intact. Sensation intact, DTR normal. Strength 5/5 in all 4.  Psychiatric: Poor judgment and insight. Alert and oriented x 1. Normal mood.    Labs on Admission: I have personally reviewed following labs and imaging studies  CBC: Recent Labs  Lab 03/01/19 2122 03/05/19 1514  WBC 9.1 6.4  NEUTROABS 6.4 3.3  HGB 16.0* 13.4  HCT 47.9* 41.7  MCV 96.6 100.2*  PLT 225 [id number]   Basic Metabolic Panel: Recent Labs  Lab 03/01/19 2122 03/05/19 1514  NA 138 146*  K 4.3 3.3*  CL 98 105  CO2 27 28  GLUCOSE 143* 99  BUN 27* 51*  CREATININE 1.22* 1.56*  CALCIUM 9.5 9.4  MG 1.7  --    Liver Function Tests: Recent Labs  Lab 03/05/19 1514  AST 29  ALT 18  ALKPHOS 48  BILITOT 1.1  PROT 6.1*  ALBUMIN 2.9*   Lipid Profile: Recent Labs    03/05/19 1514  TRIG 123   Anemia Panel: Recent Labs    03/05/19 1514  FERRITIN 80   Urine analysis:    Component Value Date/Time   COLORURINE AMBER (A) 03/01/2019 2122   APPEARANCEUR HAZY (A) 03/01/2019 2122   LABSPEC 1.014 03/01/2019 2122   PHURINE 5.0 03/01/2019 2122   GLUCOSEU NEGATIVE 03/01/2019 2122   HGBUR LARGE (A) 03/01/2019 2122   BILIRUBINUR NEGATIVE 03/01/2019 2122   KETONESUR 5 (A) 03/01/2019  2122   PROTEINUR 100 (A) 03/01/2019 2122   UROBILINOGEN 0.2 12/09/2013 2007   NITRITE NEGATIVE 03/01/2019 2122   LEUKOCYTESUR TRACE (A) 03/01/2019 2122    Radiological Exams on Admission: DG Chest Port 1 View  Result Date: 03/05/2019 CLINICAL DATA:  Found unresponsive, history of COVID-19 positivity EXAM: PORTABLE CHEST 1 VIEW COMPARISON:  03/01/2018 FINDINGS: Cardiac shadow is stable. Aortic calcifications are noted. Elevation of the right hemidiaphragm is seen. Mild right basilar atelectasis is noted. Previously noted infiltrate and effusion on the left has cleared. No bony abnormality is noted. IMPRESSION: Mild right basilar atelectasis. Electronically Signed   By: Inez Catalina M.D.   On: 03/05/2019 14:54    EKG: Independently reviewed.  Atrial fibrillation with low voltages  Assessment/Plan Principal Problem:   Encephalopathy due to COVID-19 virus Active Problems:   Permanent atrial fibrillation (HCC)   Generalized weakness   COVID-19 virus infection   1.  Encephalopathy due to COVID-19 virus: Patient unable to help herself whatsoever.  Unsure what her baseline is but she appears to be more confused than usual.  A week.  This may be due to dehydration and not having eaten in a while because her daughter has been unable to provide for her due to her own illness.  Patient will be admitted into the hospital we will hydrate her feet her and see how she does in the a.m.  2.  Permanent atrial fibrillation: Currently on Eliquis at home.  At this point she will require a dose reduction given mild renal insufficiency.  Creatinine is up compared to several days ago.  3.  Generalized weakness: We will get PT, ST and OT to see the patient.  She may require a short stay at a rehab facility until she gets her strength back.  4.  COVID-19 virus infection: Patient's daughter was offered outpatient clonal antibody treatment for her mother but felt that she could not tolerate a 3-hour day infusion and  it was declined on the 18th.  At this point patient does not appear to be terribly ill and does not appear to require remdesivir although if she has any symptoms I would consider starting it.  I did discuss it with the patient's daughter and we agreed at this point to defer and consider if she has any changes.  DVT prophylaxis: On Eliquis Code Status: Do not attempt resuscitation Family Communication: With patient's daughter at length, [place] Disposition Plan: Likely skilled nursing facility Consults called: None Admission status: Observation It is my clinical opinion that referral for OBSERVATION is reasonable and necessary in this patient based on the above information provided. The aforementioned taken together are felt to place the patient at high risk for further clinical deterioration. However it is anticipated that the patient may be medically stable for discharge from the hospital within 24 to 48 hours.   Lady Deutscher MD FACP [facility] Pager [phone]  How to contact the [facility] Attending or Consulting provider [place] or covering provider during after hours [place], for this patient?  1. Check the care team in [facility] and look for a) attending/consulting TR[facility] provider listed and b) the [facility] team listed 2. Log into [email or web address] and use [facility]'s universal password to access. If you do not have the password, please contact the hospital operator. 3. Locate the [facility] provider you are looking for under [facility] and page to a number that you can be directly reached. 4. If you still have difficulty reaching the provider, please page the [facility] (Director on Call) for the Hospitalists listed on amion for assistance.  If 7PM-7AM, please contact night-coverage [email or web address] Password [facility]  03/05/2019, 7:09 PM

## 2019-03-05 NOTE — ED Provider Notes (Signed)
I am familiar with patient as I evaluated her in the emergency room a few days ago.  She was positive for Covid and also had a urinary tract infection.  She was started on antibiotics.  She was discharged back home but has continued to decline. Home health came to see her and they found her lying in her urine.  Unclear how long she may have been there.  Her primary caretaker reportedly has COVID and is unable to provide much assistance currently.   Virgel Manifold, MD 03/05/19 [phone]

## 2019-03-05 NOTE — ED Notes (Signed)
  Attempted to call report.  RN will call me back. 

## 2019-03-05 NOTE — ED Triage Notes (Signed)
Pt BIB EMS from her own home. Per EMS patient tested positive for COVID-19 on 1/18 and was sent home in the care of her daughter. Home health came today to initiate care with the patient and found the patient to be lying in her own urine, unsure of how long she has been in bed. Pt's primary caretaker also has COVID and is unable to care for the patient at this time. Unsure of when the patient last ate or drank anything per EMS. Pt denying any pain at present time, reports she is hungry and thirsty. Pt alert and oriented to person only, which is her baseline per EMS.

## 2019-03-05 NOTE — ED Provider Notes (Signed)
[facility] EMERGENCY DEPARTMENT Provider Note   CSN: [id number] Arrival date & time: 03/05/19  1250     History No chief complaint on file.   [name] is a 86 y.o. female.  Presented to ER with concern for failure to thrive.  Per report, patient diagnosed with COVID-19, home health has been consulted to help.  Patient's caregiver not caring for patient due to COVID-19, home health reported patient lying in bed in her own urine, unsure of duration.  Patient at time of interview denies any acute complaints, but history is limited due to baseline dementia.  Level 5 caveat.  Reported baseline is alert and oriented to person only.  HPI     Past Medical History:  Diagnosis Date  . Arthritis   . Breast cancer ([place])   . Chronic back pain   . GERD (gastroesophageal reflux disease)   . History of radiation therapy 08/10/17- 09/07/17   Left Breast 28.50 Gy in 5 fractions given on a weekly basis.   . Hypertension   . UTI (urinary tract infection)     Patient Active Problem List   Diagnosis Date Noted  . Permanent atrial fibrillation ([place]) 10/27/2018  . LVH (left ventricular hypertrophy) 10/27/2018  . 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 ABDOMINAL HYSTERECTOMY     she still has one ovary in place.   Marland Kitchen BREAST LUMPECTOMY    . BREAST LUMPECTOMY WITH RADIOACTIVE SEED LOCALIZATION Left 06/22/2017   Procedure: BREAST LUMPECTOMY WITH RADIOACTIVE SEED LOCALIZATION;  Surgeon: [name], MD;  Location: [facility];  Service: General;  Laterality: Left;  . CHOLECYSTECTOMY    . HERNIA REPAIR       OB History   No obstetric history on file.     Family History  Problem Relation Age of Onset  . Stroke Mother 56    Social History   Tobacco Use  . Smoking status: Never Smoker  . Smokeless tobacco: Never Used  Substance  Use Topics  . Alcohol use: No  . Drug use: No    Home Medications Prior to Admission medications   Medication Sig Start Date End Date Taking? Authorizing Provider  amitriptyline (ELAVIL) 25 MG tablet Take 25 mg by mouth at bedtime.  04/11/17   [provider]  apixaban (ELIQUIS) 5 MG TABS tablet Take 1 tablet (5 mg total) by mouth 2 (two) times daily. LABS NEEDED FOR FURTHER REFILLS 03/03/19   Minus Breeding, MD  furosemide (LASIX) 20 MG tablet Take 1 tablet by mouth once daily 12/24/18   Minus Breeding, MD  metoprolol succinate (TOPROL-XL) 50 MG 24 hr tablet Take 1 tablet by mouth once daily 12/24/18   Minus Breeding, MD  Multiple Vitamins-Minerals (WOMENS MULTI PO) Take by mouth.    [provider]  pantoprazole (PROTONIX) 20 MG tablet Take 20 mg by mouth daily.    [provider]  senna (SENOKOT) 8.6 MG tablet Take 1 tablet by mouth 3 (three) times daily.    [provider]    Allergies    Codeine  Review of Systems   Review of Systems  Unable to perform ROS: Dementia    Physical Exam Updated Vital Signs There were no vitals taken for this visit.  Physical Exam Vitals and nursing note reviewed.  Constitutional:      General: She is not in  acute distress.    Appearance: She is well-developed.     Comments: Frail, chronically ill-appearing but no acute distress  HENT:     Head: Normocephalic and atraumatic.     Mouth/Throat:     Mouth: Mucous membranes are moist.     Pharynx: Oropharynx is clear.  Eyes:     Conjunctiva/sclera: Conjunctivae normal.  Cardiovascular:     Rate and Rhythm: Normal rate and regular rhythm.     Heart sounds: No murmur.  Pulmonary:     Effort: Pulmonary effort is normal. No respiratory distress.     Comments: Coarse breath sounds bilaterally, no respiratory distress Abdominal:     Palpations: Abdomen is soft.     Tenderness: There is no abdominal tenderness.  Musculoskeletal:        General: No swelling  or tenderness.     Cervical back: Neck supple.  Skin:    General: Skin is warm and dry.  Neurological:     Mental Status: She is alert.     Comments: Alert, oriented to person, but not place or time     ED Results / Procedures / Treatments   Labs (all labs ordered are listed, but only abnormal results are displayed) Labs Reviewed - No data to display  EKG None  Radiology No results found.  Procedures Procedures (including critical care time)  Medications Ordered in ED Medications - No data to display  ED Course  I have reviewed the triage vital signs and the nursing notes.  Pertinent labs & imaging results that were available during my care of the patient were reviewed by me and considered in my medical decision making (see chart for details).    MDM Rules/Calculators/A&P                      86 year old lady presented to ER with concern for failure to thrive, recent COVID-19 diagnosis.  Patient will need extensive Covid work-up including labs, CXR, UA.  Given the unfortunate social situation, for decline, believe patient will require inpatient admission for further management.  While awaiting work-up, patient signed out to Dr. Wilson Singer.  Refer to his note for final plan and disposition.   Final Clinical Impression(s) / ED Diagnoses Final diagnoses:  COVID-19  Failure to thrive in adult    Rx / DC Orders ED Discharge Orders    None       Lucrezia Starch, MD 03/05/19 1557

## 2019-03-06 ENCOUNTER — Encounter (HOSPITAL_COMMUNITY): Payer: Self-pay | Admitting: Internal Medicine

## 2019-03-06 DIAGNOSIS — Z885 Allergy status to narcotic agent status: Secondary | ICD-10-CM | POA: Diagnosis not present

## 2019-03-06 DIAGNOSIS — Z66 Do not resuscitate: Secondary | ICD-10-CM

## 2019-03-06 DIAGNOSIS — R627 Adult failure to thrive: Secondary | ICD-10-CM | POA: Diagnosis present

## 2019-03-06 DIAGNOSIS — Z853 Personal history of malignant neoplasm of breast: Secondary | ICD-10-CM | POA: Diagnosis not present

## 2019-03-06 DIAGNOSIS — F0390 Unspecified dementia without behavioral disturbance: Secondary | ICD-10-CM | POA: Diagnosis present

## 2019-03-06 DIAGNOSIS — E876 Hypokalemia: Secondary | ICD-10-CM | POA: Diagnosis present

## 2019-03-06 DIAGNOSIS — M1990 Unspecified osteoarthritis, unspecified site: Secondary | ICD-10-CM | POA: Diagnosis present

## 2019-03-06 DIAGNOSIS — I119 Hypertensive heart disease without heart failure: Secondary | ICD-10-CM | POA: Diagnosis present

## 2019-03-06 DIAGNOSIS — G9349 Other encephalopathy: Secondary | ICD-10-CM | POA: Diagnosis present

## 2019-03-06 DIAGNOSIS — Z79899 Other long term (current) drug therapy: Secondary | ICD-10-CM | POA: Diagnosis not present

## 2019-03-06 DIAGNOSIS — Z7901 Long term (current) use of anticoagulants: Secondary | ICD-10-CM | POA: Diagnosis not present

## 2019-03-06 DIAGNOSIS — U071 COVID-19: Secondary | ICD-10-CM | POA: Diagnosis present

## 2019-03-06 DIAGNOSIS — N390 Urinary tract infection, site not specified: Secondary | ICD-10-CM | POA: Diagnosis present

## 2019-03-06 DIAGNOSIS — I4821 Permanent atrial fibrillation: Secondary | ICD-10-CM | POA: Diagnosis present

## 2019-03-06 DIAGNOSIS — Z6821 Body mass index (BMI) 21.0-21.9, adult: Secondary | ICD-10-CM | POA: Diagnosis not present

## 2019-03-06 DIAGNOSIS — Z823 Family history of stroke: Secondary | ICD-10-CM | POA: Diagnosis not present

## 2019-03-06 DIAGNOSIS — K219 Gastro-esophageal reflux disease without esophagitis: Secondary | ICD-10-CM | POA: Diagnosis present

## 2019-03-06 DIAGNOSIS — E870 Hyperosmolality and hypernatremia: Secondary | ICD-10-CM | POA: Diagnosis present

## 2019-03-06 LAB — BASIC METABOLIC PANEL
Anion gap: 15 (ref 5–15)
BUN: 38 mg/dL — ABNORMAL HIGH (ref 8–23)
CO2: 26 mmol/L (ref 22–32)
Calcium: 8.9 mg/dL (ref 8.9–10.3)
Chloride: 106 mmol/L (ref 98–111)
Creatinine, Ser: 1.14 mg/dL — ABNORMAL HIGH (ref 0.44–1.00)
GFR calc Af Amer: 47 mL/min — ABNORMAL LOW (ref >60)
GFR calc non Af Amer: 41 mL/min — ABNORMAL LOW (ref >60)
Glucose, Bld: 86 mg/dL (ref 70–99)
Potassium: 3 mmol/L — ABNORMAL LOW (ref 3.5–5.1)
Sodium: 147 mmol/L — ABNORMAL HIGH (ref 135–145)

## 2019-03-06 LAB — CBC
HCT: 39.8 % (ref 36.0–46.0)
HCT: 40.7 % (ref 36.0–46.0)
Hemoglobin: 13.1 g/dL (ref 12.0–15.0)
Hemoglobin: 13.5 g/dL (ref 12.0–15.0)
MCH: 32.3 pg (ref 26.0–34.0)
MCH: 32.5 pg (ref 26.0–34.0)
MCHC: 32.9 g/dL (ref 30.0–36.0)
MCHC: 33.2 g/dL (ref 30.0–36.0)
MCV: 97.4 fL (ref 80.0–100.0)
MCV: 98.8 fL (ref 80.0–100.0)
Platelets: 245 10*3/uL (ref 150–400)
Platelets: 271 10*3/uL (ref 150–400)
RBC: 4.03 MIL/uL (ref 3.87–5.11)
RBC: 4.18 MIL/uL (ref 3.87–5.11)
RDW: 13.4 % (ref 11.5–15.5)
RDW: 13.5 % (ref 11.5–15.5)
WBC: 6 10*3/uL (ref 4.0–10.5)
WBC: 6.9 10*3/uL (ref 4.0–10.5)
nRBC: 0 % (ref 0.0–0.2)
nRBC: 0 % (ref 0.0–0.2)

## 2019-03-06 LAB — CREATININE, SERUM
Creatinine, Ser: 1.28 mg/dL — ABNORMAL HIGH (ref 0.44–1.00)
GFR calc Af Amer: 41 mL/min — ABNORMAL LOW (ref >60)
GFR calc non Af Amer: 35 mL/min — ABNORMAL LOW (ref >60)

## 2019-03-06 MED ORDER — POTASSIUM CHLORIDE 20 MEQ PO PACK
40.0000 meq | PACK | Freq: Once | ORAL | Status: AC
  Administered 2019-03-06: 40 meq via ORAL
  Filled 2019-03-06: qty 2

## 2019-03-06 MED ORDER — MAGNESIUM SULFATE 2 GM/50ML IV SOLN
2.0000 g | Freq: Once | INTRAVENOUS | Status: AC
  Administered 2019-03-06: 2 g via INTRAVENOUS
  Filled 2019-03-06: qty 50

## 2019-03-06 NOTE — TOC Initial Note (Signed)
Transition of Care [facility]) - Initial/Assessment Note    Patient Details  Name: QUANESHA FLOYD MRN: [id number] Date of Birth: 11/30/1920  Transition of Care [facility]) CM/SW Contact:    Benard Halsted, LCSW Phone Number: 03/06/2019, 3:43 PM  Clinical Narrative:                 Patient's daughter returned CSW's call. She sounded very unwell on the phone. She reports she is the patient's main contact. CSW discussed PT recommendation of SNF placement at time of discharge. Patient's daughter reported that patient lives with her but daughter currently has COVID and is unable to care for patient at their home given patient's current physical needs and fall risk. Patient's daughter expressed understanding of PT recommendation and is agreeable to SNF placement at time of discharge. CSW discussed insurance authorization process and provided Medicare SNF ratings list. Patient's daughter expressed being hopeful for rehab and for her and patient to feel better soon. No further questions reported at this time. CSW to continue to follow and assist with discharge planning needs.   Expected Discharge Plan: Skilled Nursing Facility Barriers to Discharge: Continued Medical Work up, SNF Pending bed offer, Unsafe home situation, Insurance Authorization   Patient Goals and CMS Choice Patient states their goals for this hospitalization and ongoing recovery are:: Rehab CMS Medicare.gov Compare Post Acute Care list provided to:: Patient Represenative (must comment)(Daughter, [name] (pt with confusion per RN)) Choice offered to / list presented to : Adult Children  Expected Discharge Plan and Services Expected Discharge Plan: [place] In-house Referral: Clinical Social Work   Post Acute Care Choice: [facility] Living arrangements for the past 2 months: [facility]                                      Prior Living Arrangements/Services Living arrangements for the past 2  months: Single Family Home Lives with:: Adult Children Patient language and need for interpreter reviewed:: Yes Do you feel safe going back to the place where you live?: Yes      Need for Family Participation in Patient Care: Yes (Comment) Care giver support system in place?: No (comment)(Daughter has COVID)   Criminal Activity/Legal Involvement Pertinent to Current Situation/Hospitalization: No - Comment as needed  Activities of Daily Living Home Assistive Devices/Equipment: Gilford Rile (specify type)    Permission Sought/Granted Permission sought to share information with : Facility Art therapist granted to share information with : No  Share Information with NAME: [name]  Permission granted to share info w AGENCY: SNFs  Permission granted to share info w Relationship: Daughter  Permission granted to share info w Contact Information: [phone]  Emotional Assessment Appearance:: Appears stated age Attitude/Demeanor/Rapport: Unable to Assess Affect (typically observed): Unable to Assess Orientation: : Oriented to Self, Oriented to Place(confusion/Dementia per RN) Alcohol / Substance Use: Not Applicable Psych Involvement: No (comment)  Admission diagnosis:  Failure to thrive in adult [R62.7] Encephalopathy due to COVID-19 virus [U07.1, G93.49] COVID-19 [U07.1] Patient Active Problem List   Diagnosis Date Noted  . COVID-19 03/06/2019  . Encephalopathy due to COVID-19 virus 03/05/2019  . Generalized weakness 03/05/2019  . COVID-19 virus infection 03/05/2019  . Permanent atrial fibrillation ([place]) 10/27/2018  . LVH (left ventricular hypertrophy) 10/27/2018  . 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

## 2019-03-06 NOTE — NC FL2 (Signed)
[place] LEVEL OF CARE SCREENING TOOL     IDENTIFICATION  Patient Name: [name] Birthdate: 1920/09/10 Sex: female Admission Date (Current Location): 03/05/2019  [facility] and [place] Number:  Herbalist and Address:  [facility], [place] [address], [place], [place] [address]      Provider Number: [id number]  Attending Physician Name and Address:  Geradine Girt, DO  Relative Name and Phone Number:  Roderic Scarce, Daughter, [phone]    Current Level of Care: Hospital Recommended Level of Care: [place] Prior Approval Number:    Date Approved/Denied:   PASRR Number: [id number]  Discharge Plan: SNF    Current Diagnoses: Patient Active Problem List   Diagnosis Date Noted  . COVID-19 03/06/2019  . Encephalopathy due to COVID-19 virus 03/05/2019  . Generalized weakness 03/05/2019  . COVID-19 virus infection 03/05/2019  . Permanent atrial fibrillation ([place]) 10/27/2018  . LVH (left ventricular hypertrophy) 10/27/2018  . 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, estrogen receptor positive ([place]) 05/23/2017    Orientation RESPIRATION BLADDER Height & Weight     Self, Place, Situation  Normal Continent Weight: 132 lb (59.9 kg) Height:     BEHAVIORAL SYMPTOMS/MOOD NEUROLOGICAL BOWEL NUTRITION STATUS      Continent Diet  AMBULATORY STATUS COMMUNICATION OF NEEDS Skin   Extensive Assist Verbally Normal                       Personal Care Assistance Level of Assistance  Bathing, Feeding, Dressing, Total care Bathing Assistance: Independent Feeding assistance: Independent Dressing Assistance: Limited assistance Total Care Assistance: Limited assistance   Functional Limitations Info  Sight, Hearing, Speech Sight Info: Adequate Hearing Info: Impaired Speech Info: Adequate    SPECIAL CARE FACTORS FREQUENCY  PT (By licensed PT), OT (By licensed OT)     PT Frequency: 4x OT  Frequency: 4x            Contractures      Additional Factors Info  Code Status, Allergies, Psychotropic, Isolation Precautions Code Status Info: DNR Allergies Info: Codeine     Isolation Precautions Info: COVID +     Current Medications (03/06/2019):  This is the current hospital active medication list Current Facility-Administered Medications  Medication Dose Route Frequency Provider Last Rate Last Admin  . 0.9 %  sodium chloride infusion  250 mL Intravenous PRN Lady Deutscher, MD      . acetaminophen (TYLENOL) tablet 650 mg  650 mg Oral Q6H PRN Lady Deutscher, MD       Or  . acetaminophen (TYLENOL) suppository 650 mg  650 mg Rectal Q6H PRN Lady Deutscher, MD      . amitriptyline (ELAVIL) tablet 25 mg  25 mg Oral QHS Lady Deutscher, MD   25 mg at 03/06/19 0026  . apixaban (ELIQUIS) tablet 2.5 mg  2.5 mg Oral BID Lady Deutscher, MD   2.5 mg at 03/06/19 1007  . magnesium sulfate IVPB 2 g 50 mL  2 g Intravenous Once Vann, Jessica U, DO      . pantoprazole (PROTONIX) EC tablet 20 mg  20 mg Oral Daily Lady Deutscher, MD   20 mg at 03/06/19 1008  . polyethylene glycol (MIRALAX / GLYCOLAX) packet 17 g  17 g Oral Daily PRN Lady Deutscher, MD      . potassium chloride (KLOR-CON) packet 40 mEq  40 mEq Oral  Once Geradine Girt, DO      . senna (SENOKOT) tablet 8.6 mg  1 tablet Oral TID Lady Deutscher, MD   8.6 mg at 03/06/19 1007  . sodium chloride flush (NS) 0.9 % injection 3 mL  3 mL Intravenous Q12H Lady Deutscher, MD   3 mL at 03/06/19 1008  . sodium chloride flush (NS) 0.9 % injection 3 mL  3 mL Intravenous PRN Lady Deutscher, MD         Discharge Medications: Please see discharge summary for a list of discharge medications.  Relevant Imaging Results:  Relevant Lab Results:   Additional Information SSN: [id number] see DC Summary  Ralph Dowdy, Student-Social Work

## 2019-03-06 NOTE — Evaluation (Signed)
Clinical/Bedside Swallow Evaluation Patient Details  Name: ITHA CAMPTON MRN: [id number] Date of Birth: 1920-05-31  Today's Date: 03/06/2019 Time: SLP Start Time (ACUTE ONLY): 1100 SLP Stop Time (ACUTE ONLY): 1115 SLP Time Calculation (min) (ACUTE ONLY): 15 min  Past Medical History:  Past Medical History:  Diagnosis Date  . Arthritis   . Breast cancer ([place])   . Chronic back pain   . GERD (gastroesophageal reflux disease)   . History of radiation therapy 08/10/17- 09/07/17   Left Breast 28.50 Gy in 5 fractions given on a weekly basis.   . Hypertension   . UTI (urinary tract infection)    Past Surgical History:  Past Surgical History:  Procedure Laterality Date  . ABDOMINAL HYSTERECTOMY     she still has one ovary in place.   Marland Kitchen BREAST LUMPECTOMY    . BREAST LUMPECTOMY WITH RADIOACTIVE SEED LOCALIZATION Left 06/22/2017   Procedure: BREAST LUMPECTOMY WITH RADIOACTIVE SEED LOCALIZATION;  Surgeon: [name], MD;  Location: [facility];  Service: General;  Laterality: Left;  . CHOLECYSTECTOMY    . HERNIA REPAIR     HPI:  JAMIESON HODGMAN is a 86 y.o. female with medical history significant of atrial fibrillation on Eliquis, ventricular hypertrophy, breast cancer in the distant past, gastroesophageal reflux disease, and mild dementia who presents from home due to profound weakness.  Home health worker went to see the patient today and found her to be in bed lying in her own urine.  On 118 the patient tested positive for COVID-19 and was sent home in the care of her daughter.  Apparently her daughter is sick and has COVID-19 as well and has been unable to take care of the patient.     Assessment / Plan / Recommendation Clinical Impression  Pt demonstrates normal swallowing ability under subjective observation. Her mentation is clearer than note from ED. Pt may continue regualr diet and thin liquids. Will defer cognitive linguistic eval as pt function is improving with  hydration and medical management. Will sign off SLP Visit Diagnosis: Dysphagia, unspecified (R13.10)    Aspiration Risk  Mild aspiration risk    Diet Recommendation Regular;Thin liquid   Liquid Administration via: Cup;Straw    Other  Recommendations     Follow up Recommendations None      Frequency and Duration            Prognosis        Swallow Study   General HPI: URANIA BURDINE is a 86 y.o. female with medical history significant of atrial fibrillation on Eliquis, ventricular hypertrophy, breast cancer in the distant past, gastroesophageal reflux disease, and mild dementia who presents from home due to profound weakness.  Home health worker went to see the patient today and found her to be in bed lying in her own urine.  On 118 the patient tested positive for COVID-19 and was sent home in the care of her daughter.  Apparently her daughter is sick and has COVID-19 as well and has been unable to take care of the patient.   Type of Study: Bedside Swallow Evaluation Previous Swallow Assessment: none Diet Prior to this Study: Regular;Thin liquids Temperature Spikes Noted: No Respiratory Status: Room air History of Recent Intubation: No Behavior/Cognition: Alert;Cooperative;Pleasant mood Oral Cavity Assessment: Within Functional Limits Oral Care Completed by SLP: No Oral Cavity - Dentition: Adequate natural dentition Vision: Functional for self-feeding Self-Feeding Abilities: Able to feed self Patient Positioning: Upright in bed Baseline Vocal Quality: Hoarse;Breathy  Volitional Cough: Strong Volitional Swallow: Able to elicit    Oral/Motor/Sensory Function Overall Oral Motor/Sensory Function: Within functional limits   Ice Chips     Thin Liquid Thin Liquid: Within functional limits Presentation: Cup;Straw;Self Fed    Nectar Thick Nectar Thick Liquid: Not tested   Honey Thick Honey Thick Liquid: Not tested   Puree Puree: Within functional limits   Solid      Solid: Within functional limits     Herbie Baltimore, MA [place] Pager [phone] Office [phone]  Lynann Beaver 03/06/2019,12:08 PM

## 2019-03-06 NOTE — Evaluation (Signed)
Occupational Therapy Evaluation Patient Details Name: [name] MRN: [id number] DOB: 10/17/1920 Today's Date: 03/06/2019    History of Present Illness [name] is a 86 y.o. female with medical history significant of atrial fibrillation on Eliquis, ventricular hypertrophy, breast cancer in the distant past, gastroesophageal reflux disease, and mild dementia who presents from home due to profound weakness.  She tested positive for Covid-19 on 03/03/19 and went home with her daughter who was unable to care for her due to testig positive herself.  Admitted with FTT/dehydration.   Clinical Impression   Patient admitted for above and limited by problem list below, including generalized weakness, impaired balance and decreased activity tolerance.  Hx of mild dementia, therefore unsure of accuracy of history but patient reports using RW and completing basic ADLs without assist.  She currently requires mod assist for bed mobility, min assist for grooming, min-total assist for dressing.  Limited tolerance and requests to return supine after minimal sitting EOB.  Patient will benefit from SNF level rehab prior to dc home to optimize strength, tolerance for engagement in ADLs and mobility.     Follow Up Recommendations  Supervision/Assistance - 24 hour;SNF    Equipment Recommendations  3 in 1 bedside commode    Recommendations for Other Services       Precautions / Restrictions Precautions Precautions: Fall Restrictions Weight Bearing Restrictions: No      Mobility Bed Mobility Overal bed mobility: Needs Assistance Bed Mobility: Supine to Sit;Sit to Supine Rolling: Min assist   Supine to sit: Mod assist;HOB elevated Sit to supine: Mod assist   General bed mobility comments: cueing for initation and sequencing, mod assist for trunk support to/from EOB  Transfers Overall transfer level: Needs assistance Equipment used: Rolling walker (2 wheeled) Transfers: Sit to/from  [facility] Sit to Stand: Mod assist;From elevated surface Stand pivot transfers: Mod assist       General transfer comment: deferred, pt fatigued    Balance Overall balance assessment: Needs assistance Sitting-balance support: No upper extremity supported;Feet supported Sitting balance-Leahy Scale: Fair Sitting balance - Comments: min guard to close supervision statically   Standing balance support: Bilateral upper extremity supported Standing balance-Leahy Scale: Poor                             ADL either performed or assessed with clinical judgement   ADL Overall ADL's : Needs assistance/impaired Eating/Feeding: Set up;Bed level   Grooming: Minimal assistance;Sitting;Wash/dry face;Wash/dry hands   Upper Body Bathing: Sitting;Minimal assistance   Lower Body Bathing: Sitting/lateral leans;Maximal assistance   Upper Body Dressing : Minimal assistance;Sitting   Lower Body Dressing: Maximal assistance;Sitting/lateral leans     Toilet Transfer Details (indicate cue type and reason): deferred          Functional mobility during ADLs: Moderate assistance(limited to EOB ) General ADL Comments: pt limited by decreased activity tolerance, endurance and weakness      Vision         Perception     Praxis      Pertinent Vitals/Pain Pain Assessment: Faces Faces Pain Scale: No hurt Pain Location: R side when helping her roll to L Pain Descriptors / Indicators: Grimacing;Moaning Pain Intervention(s): Repositioned;Monitored during session;Limited activity within patient's tolerance     Hand Dominance     Extremity/Trunk Assessment Upper Extremity Assessment Upper Extremity Assessment: Generalized weakness   Lower Extremity Assessment Lower Extremity Assessment: Defer to PT evaluation  Cervical / Trunk Assessment Cervical / Trunk Assessment: Kyphotic   Communication Communication Communication: HOH   Cognition Arousal/Alertness:  Awake/alert Behavior During Therapy: Anxious Overall Cognitive Status: No family/caregiver present to determine baseline cognitive functioning                                 General Comments: history of dementia, oriented to self and reoriented to place/situation, poor attention and problem sovling    General Comments  incontinent of urine upon standing, assist to clean up and place clean pad/mesh brief/purewick after toileting on [facility]    Exercises     Shoulder Instructions      Home Living Family/patient expects to be discharged to:: Private residence Living Arrangements: Children(daughter) Available Help at Discharge: Family Type of Home: House Home Access: Stairs to enter Technical brewer of Steps: 3   Home Layout: One level     Bathroom Shower/Tub: Occupational psychologist: Standard     Home Equipment: Environmental consultant - 2 wheels;Shower seat   Additional Comments: pts daughter is sick and has been unable to take care of her       Prior Functioning/Environment Level of Independence: Needs assistance  Gait / Transfers Assistance Needed: pt reports using RW for mobility  ADL's / Homemaking Assistance Needed: pt reports able to bathe/dress without assist    Comments: limited history, pt with mild dementia therefore unsure of accuracy         OT Problem List: Decreased strength;Impaired balance (sitting and/or standing);Decreased activity tolerance;Decreased coordination;Decreased cognition;Decreased safety awareness;Decreased knowledge of use of DME or AE;Decreased knowledge of precautions;Cardiopulmonary status limiting activity      OT Treatment/Interventions: Self-care/ADL training;DME and/or AE instruction;Balance training;Patient/family education;Cognitive remediation/compensation;Therapeutic activities;Therapeutic exercise    OT Goals(Current goals can be found in the care plan section) Acute Rehab OT Goals Patient Stated Goal: to go home OT  Goal Formulation: With patient Time For Goal Achievement: 03/20/19 Potential to Achieve Goals: Good  OT Frequency: Min 2X/week   Barriers to D/C:            Co-evaluation              AM-PAC OT "6 Clicks" Daily Activity     Outcome Measure Help from another person eating meals?: A Little Help from another person taking care of personal grooming?: A Little Help from another person toileting, which includes using toliet, bedpan, or urinal?: Total Help from another person bathing (including washing, rinsing, drying)?: A Lot Help from another person to put on and taking off regular upper body clothing?: A Lot Help from another person to put on and taking off regular lower body clothing?: Total 6 Click Score: 12   End of Session Nurse Communication: Mobility status  Activity Tolerance: Patient limited by fatigue Patient left: in bed;with call bell/phone within reach;with bed alarm set  OT Visit Diagnosis: Other abnormalities of gait and mobility (R26.89);Muscle weakness (generalized) (M62.81);Other symptoms and signs involving cognitive function                Time: 1345-1410 OT Time Calculation (min): 25 min Charges:  OT General Charges $OT Visit: 1 Visit OT Evaluation $OT Eval Moderate Complexity: 1 Mod OT Treatments $Self Care/Home Management : 8-22 mins  Jolaine Artist, OT Acute Rehabilitation Services Pager [phone] Office [phone]   Delight Stare 03/06/2019, 3:21 PM

## 2019-03-06 NOTE — Progress Notes (Signed)
CSW received call from [place] liaison stating they did not feel patient would be safe to return home with her daughter until her daughter has recovered from [place]. CSW left voicemail for daughter. RN to let CSW know if patient's granddaughter-in-law calls back to obtain her contact info.   Percell Locus Dionysios Massman LCSW [phone]

## 2019-03-06 NOTE — Progress Notes (Signed)
Progress Note    [name]  [id number] DOB: 11-29-1920  DOA: 03/05/2019 PCP: Lawerance Cruel, MD    Brief Narrative:     Medical records reviewed and are as summarized below:  [name] is an 86 y.o. female with medical history significant of atrial fibrillation on Eliquis, ventricular hypertrophy, breast cancer in the distant past, gastroesophageal reflux disease, and mild dementia who presents from home due to profound weakness.  Home health worker went to see the patient today and found her to be in bed lying in her own urine.  On 1/18 the patient tested positive for COVID-19 and was sent home in the care of her daughter.  Apparently her daughter is sick and has COVID-19 as well and has been unable to take care of the patient.  Assessment/Plan:   Principal Problem:   Encephalopathy due to COVID-19 virus Active Problems:   Permanent atrial fibrillation (HCC)   Generalized weakness   COVID-19 virus infection     Encephalopathy due to COVID-19 virus: - Patient unable to help herself whatsoever.   -Unsure what her baseline is -- lives with daughter who also has COVID infection  Hypokalemia -replete PO  Hypernatremia Due to poor PO intake -encourage water   Permanent atrial fibrillation: -Eliquis   Generalized weakness: -PT eval-- may need short stay in SNF to recover (daughter is sick as well)  COVID-19 virus infection: Patient's daughter was offered outpatient clonal antibody treatment for her mother but felt that she could not tolerate a 3-hour day infusion and it was declined on the 18th.   -At this point patient does not appear to be terribly ill and does not appear to require remdesivir  Family Communication/Anticipated D/C date and plan/Code Status   DVT prophylaxis: eliquis Code Status: dnr Disposition Plan: Pending improvement and ability of family to care for her     Subjective:   Did not like breakfast Did not sleep well  last night  Objective:    Vitals:   03/05/19 2245 03/05/19 2300 03/06/19 0024 03/06/19 1000  BP: (!) 123/95 132/70 140/86 136/81  Pulse: (!) 106 84 100 82  Resp: 19 (!) 21 20 18   Temp:   97.7 F (36.5 C) 97.8 F (36.6 C)  TempSrc:   Oral Oral  SpO2: 100% 97% 99% 98%  Weight:        Intake/Output Summary (Last 24 hours) at 03/06/2019 1304 Last data filed at 03/06/2019 1000 Gross per 24 hour  Intake 120 ml  Output --  Net 120 ml   Filed Weights   03/05/19 1800  Weight: 59.9 kg    Exam: In bed pleasant cooperative Regular rate and rhythm Not on oxygen, no increased work of breathing No LE edema Lips dry   Data Reviewed:   I have personally reviewed following labs and imaging studies:  Labs: Labs show the following:   Basic Metabolic Panel: Recent Labs  Lab 03/01/19 2122 03/01/19 2122 03/05/19 1514 03/06/19 0002 03/06/19 0816  NA 138  --  146*  --  147*  K 4.3   < > 3.3*  --  3.0*  CL 98  --  105  --  106  CO2 27  --  28  --  26  GLUCOSE 143*  --  99  --  86  BUN 27*  --  51*  --  38*  CREATININE 1.22*  --  1.56* 1.28* 1.14*  CALCIUM 9.5  --  9.4  --  8.9  MG 1.7  --   --   --   --    < > = values in this interval not displayed.   GFR Estimated Creatinine Clearance: 26 mL/min (A) (by C-G formula based on SCr of 1.14 mg/dL (H)). Liver Function Tests: Recent Labs  Lab 03/05/19 1514  AST 29  ALT 18  ALKPHOS 48  BILITOT 1.1  PROT 6.1*  ALBUMIN 2.9*   No results for input(s): LIPASE, AMYLASE in the last 168 hours. No results for input(s): AMMONIA in the last 168 hours. Coagulation profile No results for input(s): INR, PROTIME in the last 168 hours.  CBC: Recent Labs  Lab 03/01/19 2122 03/05/19 1514 03/06/19 0002 03/06/19 0816  WBC 9.1 6.4 6.0 6.9  NEUTROABS 6.4 3.3  --   --   HGB 16.0* 13.4 13.1 13.5  HCT 47.9* 41.7 39.8 40.7  MCV 96.6 100.2* 98.8 97.4  PLT 225 276 271 245   Cardiac Enzymes: No results for input(s): CKTOTAL, CKMB,  CKMBINDEX, TROPONINI in the last 168 hours. BNP (last 3 results) No results for input(s): PROBNP in the last 8760 hours. CBG: No results for input(s): GLUCAP in the last 168 hours. D-Dimer: Recent Labs    03/05/19 1514  DDIMER 0.91*   Hgb A1c: No results for input(s): HGBA1C in the last 72 hours. Lipid Profile: Recent Labs    03/05/19 1514  TRIG 123   Thyroid function studies: No results for input(s): TSH, T4TOTAL, T3FREE, THYROIDAB in the last 72 hours.  Invalid input(s): FREET3 Anemia work up: Recent Labs    03/05/19 1514  FERRITIN 80   Sepsis Labs: Recent Labs  Lab 03/01/19 2122 03/05/19 1500 03/05/19 1514 03/05/19 1625 03/06/19 0002 03/06/19 0816  PROCALCITON  --  0.14  --   --   --   --   WBC 9.1  --  6.4  --  6.0 6.9  LATICACIDVEN  --   --  1.2 1.4  --   --     Microbiology Recent Results (from the past 240 hour(s))  SARS CORONAVIRUS 2 (TAT 6-24 HRS) Nasopharyngeal Nasopharyngeal Swab     Status: Abnormal   Collection Time: 03/01/19  9:22 PM   Specimen: Nasopharyngeal Swab  Result Value Ref Range Status   SARS Coronavirus 2 POSITIVE (A) NEGATIVE Final    Comment: EMAILED TO Aretta Nip RN.@1052  ON 1.17.21 BY TCALDWELL MT. (NOTE) SARS-CoV-2 target nucleic acids are DETECTED. The SARS-CoV-2 RNA is generally detectable in upper and lower respiratory specimens during the acute phase of infection. Positive results are indicative of the presence of SARS-CoV-2 RNA. Clinical correlation with patient history and other diagnostic information is  necessary to determine patient infection status. Positive results do not rule out bacterial infection or co-infection with other viruses.  The expected result is Negative. Fact Sheet for Patients: SugarRoll.be Fact Sheet for Healthcare Providers: https://www.woods-mathews.com/ This test is not yet approved or cleared by the [place] FDA and  has been authorized for  detection and/or diagnosis of SARS-CoV-2 by FDA under an Emergency Use Authorization (EUA). This EUA will remain  in effect (meaning this test can be used) for the duration of the COVID-19  declaration under Section 564(b)(1) of the Act, 21 U.S.C. section 360bbb-3(b)(1), unless the authorization is terminated or revoked sooner. Performed at [facility], [place] [address], [place], [place] [address]   Blood Culture (routine x 2)     Status: None (Preliminary result)   Collection Time: 03/05/19  3:15 PM   Specimen: BLOOD  Result Value Ref Range Status   Specimen Description BLOOD RIGHT ANTECUBITAL  Final   Special Requests   Final    BOTTLES DRAWN AEROBIC AND ANAEROBIC Blood Culture results may not be optimal due to an excessive volume of blood received in culture bottles   Culture   Final    NO GROWTH < 24 HOURS Performed at [facility] [address] [place], [place] [address]    Report Status PENDING  Incomplete  Blood Culture (routine x 2)     Status: None (Preliminary result)   Collection Time: 03/05/19  5:21 PM   Specimen: BLOOD  Result Value Ref Range Status   Specimen Description BLOOD LEFT ANTECUBITAL  Final   Special Requests   Final    BOTTLES DRAWN AEROBIC AND ANAEROBIC Blood Culture adequate volume   Culture   Final    NO GROWTH < 12 HOURS Performed at [facility], [place] [address], [place], [place] [address]    Report Status PENDING  Incomplete    Procedures and diagnostic studies:  DG Chest Port 1 View  Result Date: 03/05/2019 CLINICAL DATA:  Found unresponsive, history of COVID-19 positivity EXAM: PORTABLE CHEST 1 VIEW COMPARISON:  03/01/2018 FINDINGS: Cardiac shadow is stable. Aortic calcifications are noted. Elevation of the right hemidiaphragm is seen. Mild right basilar atelectasis is noted. Previously noted infiltrate and effusion on the left has cleared. No bony abnormality is noted. IMPRESSION: Mild right basilar atelectasis.  Electronically Signed   By: Inez Catalina M.D.   On: 03/05/2019 14:54    Medications:   . amitriptyline  25 mg Oral QHS  . apixaban  2.5 mg Oral BID  . pantoprazole  20 mg Oral Daily  . senna  1 tablet Oral TID  . sodium chloride flush  3 mL Intravenous Q12H   Continuous Infusions: . sodium chloride    . 0.9 % NaCl with KCl 20 mEq / L 75 mL/hr at 03/06/19 1000     LOS: 0 days   Geradine Girt  [facility]   How to contact the [facility] Attending or Consulting provider [place] or covering provider during after hours [place], for this patient?  1. Check the care team in [facility] and look for a) attending/consulting TRH provider listed and b) the [facility] team listed 2. Log into [email or web address] and use [place]'s universal password to access. If you do not have the password, please contact the hospital operator. 3. Locate the [facility] provider you are looking for under [facility] and page to a number that you can be directly reached. 4. If you still have difficulty reaching the provider, please page the [facility] (Director on Call) for the Hospitalists listed on amion for assistance.  03/06/2019, 1:04 PM

## 2019-03-06 NOTE — Evaluation (Signed)
Physical Therapy Evaluation Patient Details Name: [name] MRN: [id number] DOB: 06/16/1920 Today's Date: 03/06/2019   History of Present Illness  [name] is a 86 y.o. female with medical history significant of atrial fibrillation on Eliquis, ventricular hypertrophy, breast cancer in the distant past, gastroesophageal reflux disease, and mild dementia who presents from home due to profound weakness.  She tested positive for Covid-19 on 03/03/19 and went home with her daughter who was unable to care for her due to testig positive herself.  Admitted with FTT/dehydration.  Clinical Impression  Patient presents with decreased mobility due to weakness, decreased balance and decreased activity tolerance.  Currently mod A for OOB to [facility] which isn't well tolerated due to weakness/fatigue.  Feel she may need SNF level rehab at d/c as daughter currently likely unable to provide this level of assistance.  PT to follow acutely.     Follow Up Recommendations SNF    Equipment Recommendations  None recommended by PT    Recommendations for Other Services       Precautions / Restrictions Precautions Precautions: Fall Restrictions Weight Bearing Restrictions: No      Mobility  Bed Mobility Overal bed mobility: Needs Assistance Bed Mobility: Rolling;Supine to Sit;Sit to Supine Rolling: Min assist   Supine to sit: Mod assist;HOB elevated Sit to supine: Mod assist   General bed mobility comments: cues and increased time to move legs off bed, assist for trunk upright, to supine assist for legs into bed and to scoot up in bed  Transfers Overall transfer level: Needs assistance Equipment used: Rolling walker (2 wheeled) Transfers: Sit to/from [facility] Sit to Stand: Mod assist;From elevated surface Stand pivot transfers: Mod assist       General transfer comment: lifting help to stand, assist for balance with RW to step to [facility], then back to  bed  Ambulation/Gait             General Gait Details: deferred, pt feeling too weak  Stairs            Wheelchair Mobility    Modified Rankin (Stroke Patients Only)       Balance Overall balance assessment: Needs assistance   Sitting balance-Leahy Scale: Fair     Standing balance support: Bilateral upper extremity supported Standing balance-Leahy Scale: Poor                               Pertinent Vitals/Pain Pain Assessment: Faces Faces Pain Scale: Hurts little more Pain Location: R side when helping her roll to L Pain Descriptors / Indicators: Grimacing;Moaning Pain Intervention(s): Repositioned;Monitored during session;Limited activity within patient's tolerance    Home Living Family/patient expects to be discharged to:: Skilled nursing facility Living Arrangements: Children Available Help at Discharge: Family Type of Home: House         Home Equipment: Gilford Rile - 2 wheels Additional Comments: uses walker per pt    Prior Function           Comments: limited information about home obtained     Hand Dominance        Extremity/Trunk Assessment   Upper Extremity Assessment Upper Extremity Assessment: Generalized weakness    Lower Extremity Assessment Lower Extremity Assessment: Generalized weakness    Cervical / Trunk Assessment Cervical / Trunk Assessment: Kyphotic  Communication   Communication: HOH  Cognition Arousal/Alertness: Awake/alert Behavior During Therapy: Anxious Overall Cognitive Status: No family/caregiver present to determine  baseline cognitive functioning                                 General Comments: history of dementia, seems to know where she is and why she is here, but limited focus on task due to distracted by not feeling well      General Comments General comments (skin integrity, edema, etc.): incontinent of urine upon standing, assist to clean up and place clean pad/mesh  brief/purewick after toileting on [facility]    Exercises     Assessment/Plan    PT Assessment Patient needs continued PT services  PT Problem List Decreased strength;Decreased activity tolerance;Decreased mobility;Decreased safety awareness;Decreased knowledge of precautions;Decreased balance       PT Treatment Interventions DME instruction;Therapeutic activities;Balance training;Patient/family education;Therapeutic exercise;Functional mobility training;Gait training    PT Goals (Current goals can be found in the Care Plan section)  Acute Rehab PT Goals Patient Stated Goal: to go home PT Goal Formulation: With patient Time For Goal Achievement: 03/20/19 Potential to Achieve Goals: Fair    Frequency Min 2X/week   Barriers to discharge        Co-evaluation               AM-PAC PT "6 Clicks" Mobility  Outcome Measure Help needed turning from your back to your side while in a flat bed without using bedrails?: A Lot Help needed moving from lying on your back to sitting on the side of a flat bed without using bedrails?: A Lot Help needed moving to and from a bed to a chair (including a wheelchair)?: A Lot Help needed standing up from a chair using your arms (e.g., wheelchair or bedside chair)?: A Lot Help needed to walk in hospital room?: Total Help needed climbing 3-5 steps with a railing? : Total 6 Click Score: 10    End of Session   Activity Tolerance: Patient limited by fatigue Patient left: in bed;with call bell/phone within reach;with bed alarm set Nurse Communication: Mobility status PT Visit Diagnosis: Muscle weakness (generalized) (M62.81);Other abnormalities of gait and mobility (R26.89)    Time: 1115-1140 PT Time Calculation (min) (ACUTE ONLY): 25 min   Charges:   PT Evaluation $PT Eval Moderate Complexity: 1 Mod PT Treatments $Therapeutic Activity: 8-22 mins        Magda Kiel, [place] [facility] [phone] 03/06/2019   Reginia Naas 03/06/2019, 12:39 PM

## 2019-03-07 LAB — BASIC METABOLIC PANEL
Anion gap: 9 (ref 5–15)
BUN: 26 mg/dL — ABNORMAL HIGH (ref 8–23)
CO2: 26 mmol/L (ref 22–32)
Calcium: 8.5 mg/dL — ABNORMAL LOW (ref 8.9–10.3)
Chloride: 106 mmol/L (ref 98–111)
Creatinine, Ser: 0.85 mg/dL (ref 0.44–1.00)
GFR calc Af Amer: >60 mL/min (ref >60)
GFR calc non Af Amer: 58 mL/min — ABNORMAL LOW (ref >60)
Glucose, Bld: 104 mg/dL — ABNORMAL HIGH (ref 70–99)
Potassium: 3.1 mmol/L — ABNORMAL LOW (ref 3.5–5.1)
Sodium: 141 mmol/L (ref 135–145)

## 2019-03-07 LAB — CBC
HCT: 37.3 % (ref 36.0–46.0)
Hemoglobin: 12.2 g/dL (ref 12.0–15.0)
MCH: 31.8 pg (ref 26.0–34.0)
MCHC: 32.7 g/dL (ref 30.0–36.0)
MCV: 97.1 fL (ref 80.0–100.0)
Platelets: 227 10*3/uL (ref 150–400)
RBC: 3.84 MIL/uL — ABNORMAL LOW (ref 3.87–5.11)
RDW: 13.5 % (ref 11.5–15.5)
WBC: 6.2 10*3/uL (ref 4.0–10.5)
nRBC: 0 % (ref 0.0–0.2)

## 2019-03-07 MED ORDER — POTASSIUM CHLORIDE 20 MEQ PO PACK
40.0000 meq | PACK | Freq: Two times a day (BID) | ORAL | Status: AC
  Administered 2019-03-07 (×2): 40 meq via ORAL
  Filled 2019-03-07 (×2): qty 2

## 2019-03-07 MED ORDER — METOPROLOL SUCCINATE ER 25 MG PO TB24
25.0000 mg | ORAL_TABLET | Freq: Every day | ORAL | Status: DC
  Administered 2019-03-07: 25 mg via ORAL
  Filled 2019-03-07: qty 1

## 2019-03-07 NOTE — TOC Progression Note (Signed)
Transition of Care [facility]) - Progression Note    Patient Details  Name: [name] MRN: [id number] Date of Birth: [birth year]  Transition of Care [facility]) CM/SW Contact  Bartholomew Crews, RN Phone Number: [phone] 03/07/2019, 1:52 PM  Clinical Narrative:    Received message from nursing this morning that granddaughter in law, Para Skeans, [phone]. Unable to leave voicemail d/t full. Left voicemail for Ma Hillock, grandson, [phone] with NCM contact information. Also advised nursing that is was ok to provide NCM contact information. TOC following.    Expected Discharge Plan: [place] Barriers to Discharge: Continued Medical Work up, SNF Pending bed offer, Unsafe home situation, Ship broker  Expected Discharge Plan and Services Expected Discharge Plan: [place] In-house Referral: Clinical Social Work   Post Acute Care Choice: [place] Living arrangements for the past 2 months: Single Family Home                                       Social Determinants of Health (SDOH) Interventions    Readmission Risk Interventions No flowsheet data found.

## 2019-03-07 NOTE — Progress Notes (Addendum)
PROGRESS NOTE    [name]  [id number] DOB: 07-28-1920 DOA: 03/05/2019 PCP: Lawerance Cruel, MD   Brief Narrative: [name] is an 86 y.o. female with medical history significant ofatrial fibrillation on Eliquis, ventricular hypertrophy, breast cancer in the distant past, gastroesophageal reflux disease, and mild dementia who presents from home due to profound weakness. Home health worker went to see the patient at home and found her to be in bed lying in her own urine. On 1/18 the patient tested positive for COVID-19 and was sent home in the care of her daughter. Apparently her daughter is sick and has COVID-19 as well and has been unable to take care of the patient.   Assessment & Plan:   Principal Problem:   Encephalopathy due to COVID-19 virus Active Problems:   Permanent atrial fibrillation (HCC)   Generalized weakness   COVID-19 virus infection   COVID-19   DNR (do not resuscitate)  # Encephalopathy due to COVID-19 virus: Improving - Patient unable to help herself whatsoever.  - Unsure what her baseline  Mental status is -- lives with daughter who also has COVID infection. - CT head : unremarkable.  Hypokalemia: -replete PO  Hypernatremia Due to poor PO intake -encourage water  Permanent atrial fibrillation: -Eliquis , HR controlled  HTN:  Resume Home Toprol XL 25 mg daily. Check BP and will increase to 50 mg daily She takes 50 mg daily.  Generalized weakness: -PT eval-- may need short stay in SNF to recover (daughter is sick as well)  COVID-19 virus infection: Patient's daughter was offered outpatient clonal antibody treatment for her mother but felt that she could not tolerate a 3-hour day infusion and it was declined on the 18th.  -At this point patient does not appear to be terribly ill and does not appear to require remdesivir  DVT prophylaxis: Eliquis  Code Status:DNR Family Communication:  No one at bed side. Disposition  Plan: Pending improvement and ability of family to care for her. It would be unsafe discharge, since her daughter is sick with Covid.  Consultants:    None  Procedures: None  Antimicrobials: Anti-infectives (From admission, onward)   None      Subjective: Patient was seen and examined at bedside.  She appears slightly confused but lying in the bed comfortably.  Not on oxygen, no increased work of breathing.  Objective: Vitals:   03/06/19 1752 03/06/19 2055 03/07/19 0831 03/07/19 0835  BP: (!) 158/119 (!) 151/93 (!) 143/100 (!) 143/100  Pulse: 92 91 93 65  Resp: 19 18 18    Temp: (!) 97.2 F (36.2 C) (!) 97.5 F (36.4 C) (!) 97.5 F (36.4 C)   TempSrc:  Oral Oral   SpO2: 100% 99% 96% 98%  Weight:        Intake/Output Summary (Last 24 hours) at 03/07/2019 1422 Last data filed at 03/06/2019 2129 Gross per 24 hour  Intake 123 ml  Output --  Net 123 ml   Filed Weights   03/05/19 1800  Weight: 59.9 kg    Examination:  General exam: Appears calm and comfortable  Respiratory system: Clear to auscultation. Respiratory effort normal. Cardiovascular system: S1 & S2 heard, RRR. No JVD, murmurs, rubs, gallops or clicks. No pedal edema. Gastrointestinal system: Abdomen is nondistended, soft and nontender. No organomegaly or masses felt. Normal bowel sounds heard. Central nervous system: Alert and oriented. No focal neurological deficits. Extremities: No edema, No swelling or pain Skin: No rashes, lesions or ulcers  Psychiatry: Judgement and insight appear normal. Mood & affect appropriate.     Data Reviewed: I have personally reviewed following labs and imaging studies  CBC: Recent Labs  Lab 03/01/19 2122 03/05/19 1514 03/06/19 0002 03/06/19 0816 03/07/19 0258  WBC 9.1 6.4 6.0 6.9 6.2  NEUTROABS 6.4 3.3  --   --   --   HGB 16.0* 13.4 13.1 13.5 12.2  HCT 47.9* 41.7 39.8 40.7 37.3  MCV 96.6 100.2* 98.8 97.4 97.1  PLT 225 276 271 245 [id number]   Basic Metabolic  Panel: Recent Labs  Lab 03/01/19 2122 03/05/19 1514 03/06/19 0002 03/06/19 0816 03/07/19 0258  NA 138 146*  --  147* 141  K 4.3 3.3*  --  3.0* 3.1*  CL 98 105  --  106 106  CO2 27 28  --  26 26  GLUCOSE 143* 99  --  86 104*  BUN 27* 51*  --  38* 26*  CREATININE 1.22* 1.56* 1.28* 1.14* 0.85  CALCIUM 9.5 9.4  --  8.9 8.5*  MG 1.7  --   --   --   --    GFR: Estimated Creatinine Clearance: 34.8 mL/min (by C-G formula based on SCr of 0.85 mg/dL). Liver Function Tests: Recent Labs  Lab 03/05/19 1514  AST 29  ALT 18  ALKPHOS 48  BILITOT 1.1  PROT 6.1*  ALBUMIN 2.9*   No results for input(s): LIPASE, AMYLASE in the last 168 hours. No results for input(s): AMMONIA in the last 168 hours. Coagulation Profile: No results for input(s): INR, PROTIME in the last 168 hours. Cardiac Enzymes: No results for input(s): CKTOTAL, CKMB, CKMBINDEX, TROPONINI in the last 168 hours. BNP (last 3 results) No results for input(s): PROBNP in the last 8760 hours. HbA1C: No results for input(s): HGBA1C in the last 72 hours. CBG: No results for input(s): GLUCAP in the last 168 hours. Lipid Profile: Recent Labs    03/05/19 1514  TRIG 123   Thyroid Function Tests: No results for input(s): TSH, T4TOTAL, FREET4, T3FREE, THYROIDAB in the last 72 hours. Anemia Panel: Recent Labs    03/05/19 1514  FERRITIN 80   Sepsis Labs: Recent Labs  Lab 03/05/19 1500 03/05/19 1514 03/05/19 1625  PROCALCITON 0.14  --   --   LATICACIDVEN  --  1.2 1.4    Recent Results (from the past 240 hour(s))  SARS CORONAVIRUS 2 (TAT 6-24 HRS) Nasopharyngeal Nasopharyngeal Swab     Status: Abnormal   Collection Time: 03/01/19  9:22 PM   Specimen: Nasopharyngeal Swab  Result Value Ref Range Status   SARS Coronavirus 2 POSITIVE (A) NEGATIVE Final    Comment: EMAILED TO Aretta Nip RN.@1052  ON 1.17.21 BY TCALDWELL MT. (NOTE) SARS-CoV-2 target nucleic acids are DETECTED. The SARS-CoV-2 RNA is generally detectable  in upper and lower respiratory specimens during the acute phase of infection. Positive results are indicative of the presence of SARS-CoV-2 RNA. Clinical correlation with patient history and other diagnostic information is  necessary to determine patient infection status. Positive results do not rule out bacterial infection or co-infection with other viruses.  The expected result is Negative. Fact Sheet for Patients: SugarRoll.be Fact Sheet for Healthcare Providers: https://www.woods-mathews.com/ This test is not yet approved or cleared by the [place] FDA and  has been authorized for detection and/or diagnosis of SARS-CoV-2 by FDA under an Emergency Use Authorization (EUA). This EUA will remain  in effect (meaning this test can be used) for the duration of the COVID-19  declaration under Section 564(b)(1) of the Act, 21 U.S.C. section 360bbb-3(b)(1), unless the authorization is terminated or revoked sooner. Performed at [facility], [place] [address], [place], [place] [address]   Blood Culture (routine x 2)     Status: None (Preliminary result)   Collection Time: 03/05/19  3:15 PM   Specimen: BLOOD  Result Value Ref Range Status   Specimen Description BLOOD RIGHT ANTECUBITAL  Final   Special Requests   Final    BOTTLES DRAWN AEROBIC AND ANAEROBIC Blood Culture results may not be optimal due to an excessive volume of blood received in culture bottles   Culture   Final    NO GROWTH 2 DAYS Performed at [facility], [place] [address], [place], [place] [address]    Report Status PENDING  Incomplete  Blood Culture (routine x 2)     Status: None (Preliminary result)   Collection Time: 03/05/19  5:21 PM   Specimen: BLOOD  Result Value Ref Range Status   Specimen Description BLOOD LEFT ANTECUBITAL  Final   Special Requests   Final    BOTTLES DRAWN AEROBIC AND ANAEROBIC Blood Culture adequate volume   Culture   Final    NO GROWTH 2  DAYS Performed at [facility], [place] [address] [place], [place] [address]    Report Status PENDING  Incomplete     Radiology Studies: DG Chest Port 1 View  Result Date: 03/05/2019 CLINICAL DATA:  Found unresponsive, history of COVID-19 positivity EXAM: PORTABLE CHEST 1 VIEW COMPARISON:  03/01/2018 FINDINGS: Cardiac shadow is stable. Aortic calcifications are noted. Elevation of the right hemidiaphragm is seen. Mild right basilar atelectasis is noted. Previously noted infiltrate and effusion on the left has cleared. No bony abnormality is noted. IMPRESSION: Mild right basilar atelectasis. Electronically Signed   By: Inez Catalina M.D.   On: 03/05/2019 14:54     Scheduled Meds: . amitriptyline  25 mg Oral QHS  . apixaban  2.5 mg Oral BID  . pantoprazole  20 mg Oral Daily  . potassium chloride  40 mEq Oral BID  . senna  1 tablet Oral TID  . sodium chloride flush  3 mL Intravenous Q12H   Continuous Infusions: . sodium chloride       LOS: 1 day    Time spent: 25 mins    Serine Kea, MD [facility]   If 7PM-7AM, please contact night-coverage

## 2019-03-07 NOTE — TOC Progression Note (Signed)
Transition of Care [facility]) - Progression Note    Patient Details  Name: OREA ROESEL MRN: [id number] Date of Birth: 1920-09-20  Transition of Care [facility]) CM/SW Contact  Bartholomew Crews, RN Phone Number: [phone] 03/07/2019, 3:27 PM  Clinical Narrative:    Received call from Para Skeans. Bed offers provided. She will follow up with selection. TOC following.    Expected Discharge Plan: [place] Barriers to Discharge: Continued Medical Work up, SNF Pending bed offer, Unsafe home situation, Ship broker  Expected Discharge Plan and Services Expected Discharge Plan: [place] In-house Referral: Clinical Social Work   Post Acute Care Choice: [place] Living arrangements for the past 2 months: Single Family Home                                       Social Determinants of Health (SDOH) Interventions    Readmission Risk Interventions No flowsheet data found.

## 2019-03-07 NOTE — TOC Progression Note (Signed)
Transition of Care [facility]) - Progression Note    Patient Details  Name: [name] MRN: [id number] Date of Birth: 1920/03/03  Transition of Care [facility]) CM/SW Contact  Bartholomew Crews, RN Phone Number: [phone] 03/07/2019, 4:12 PM  Clinical Narrative:    Discussed with Para Skeans that [facility] was the only facility who could offer a patient bed. [facility] agreed to bed offer. Bed will be available on Monday.  Confirmed bed offer with [facility] and contact information for family provided. [facility] to follow up with family on Monday for admission paperwork. TOC team following.   Expected Discharge Plan: [place] Barriers to Discharge: Continued Medical Work up, SNF Pending bed offer, Unsafe home situation, Ship broker  Expected Discharge Plan and Services Expected Discharge Plan: [place] In-house Referral: Clinical Social Work   Post Acute Care Choice: [place] Living arrangements for the past 2 months: Single Family Home                                       Social Determinants of Health (SDOH) Interventions    Readmission Risk Interventions No flowsheet data found.

## 2019-03-08 LAB — CBC
HCT: 40.3 % (ref 36.0–46.0)
Hemoglobin: 12.8 g/dL (ref 12.0–15.0)
MCH: 31.8 pg (ref 26.0–34.0)
MCHC: 31.8 g/dL (ref 30.0–36.0)
MCV: 100 fL (ref 80.0–100.0)
Platelets: 176 10*3/uL (ref 150–400)
RBC: 4.03 MIL/uL (ref 3.87–5.11)
RDW: 13.4 % (ref 11.5–15.5)
WBC: 7.1 10*3/uL (ref 4.0–10.5)
nRBC: 0 % (ref 0.0–0.2)

## 2019-03-08 LAB — COMPREHENSIVE METABOLIC PANEL
ALT: 15 U/L (ref 0–44)
AST: 31 U/L (ref 15–41)
Albumin: 2.5 g/dL — ABNORMAL LOW (ref 3.5–5.0)
Alkaline Phosphatase: 49 U/L (ref 38–126)
Anion gap: 9 (ref 5–15)
BUN: 18 mg/dL (ref 8–23)
CO2: 22 mmol/L (ref 22–32)
Calcium: 8.6 mg/dL — ABNORMAL LOW (ref 8.9–10.3)
Chloride: 109 mmol/L (ref 98–111)
Creatinine, Ser: 0.92 mg/dL (ref 0.44–1.00)
GFR calc Af Amer: >60 mL/min (ref >60)
GFR calc non Af Amer: 53 mL/min — ABNORMAL LOW (ref >60)
Glucose, Bld: 99 mg/dL (ref 70–99)
Potassium: 4.7 mmol/L (ref 3.5–5.1)
Sodium: 140 mmol/L (ref 135–145)
Total Bilirubin: 1.3 mg/dL — ABNORMAL HIGH (ref 0.3–1.2)
Total Protein: 5 g/dL — ABNORMAL LOW (ref 6.5–8.1)

## 2019-03-08 LAB — PHOSPHORUS: Phosphorus: 2 mg/dL — ABNORMAL LOW (ref 2.5–4.6)

## 2019-03-08 LAB — MAGNESIUM: Magnesium: 1.6 mg/dL — ABNORMAL LOW (ref 1.7–2.4)

## 2019-03-08 MED ORDER — K PHOS MONO-SOD PHOS DI & MONO 155-852-130 MG PO TABS
250.0000 mg | ORAL_TABLET | Freq: Three times a day (TID) | ORAL | Status: AC
  Administered 2019-03-08 – 2019-03-10 (×6): 250 mg via ORAL
  Filled 2019-03-08 (×6): qty 1

## 2019-03-08 MED ORDER — METOPROLOL SUCCINATE ER 50 MG PO TB24
50.0000 mg | ORAL_TABLET | Freq: Every day | ORAL | Status: DC
  Administered 2019-03-08 – 2019-03-10 (×3): 50 mg via ORAL
  Filled 2019-03-08 (×3): qty 1

## 2019-03-08 MED ORDER — POTASSIUM CHLORIDE 10 MEQ/100ML IV SOLN
10.0000 meq | INTRAVENOUS | Status: AC
  Administered 2019-03-08 (×2): 10 meq via INTRAVENOUS
  Filled 2019-03-08 (×2): qty 100

## 2019-03-08 MED ORDER — MAGNESIUM SULFATE 2 GM/50ML IV SOLN
2.0000 g | Freq: Once | INTRAVENOUS | Status: AC
  Administered 2019-03-08: 2 g via INTRAVENOUS
  Filled 2019-03-08: qty 50

## 2019-03-08 MED ORDER — POTASSIUM CHLORIDE 20 MEQ PO PACK
40.0000 meq | PACK | Freq: Two times a day (BID) | ORAL | Status: DC
  Administered 2019-03-08: 40 meq via ORAL
  Filled 2019-03-08 (×2): qty 2

## 2019-03-08 NOTE — Progress Notes (Signed)
PROGRESS NOTE    [name]  [id number] DOB: May 06, 1920 DOA: 03/05/2019 PCP: Lawerance Cruel, MD   Brief Narrative: [name] is a 86 y.o. female with medical history significant ofatrial fibrillation on Eliquis, ventricular hypertrophy, breast cancer in the distant past, gastroesophageal reflux disease, and mild dementia who presents from home due to profound weakness. Home health worker went to see the patient at home and found her to be in bed lying in her own urine. On 1/18 the patient tested positive for COVID-19 and was sent home in the care of her daughter. Apparently her daughter is sick and has COVID-19 as well and has been unable to take care of the patient.   Assessment & Plan:   Principal Problem:   Encephalopathy due to COVID-19 virus Active Problems:   Permanent atrial fibrillation (HCC)   Generalized weakness   COVID-19 virus infection   COVID-19   DNR (do not resuscitate)  # Encephalopathy due to COVID-19 virus: Improving - Patient unable to help herself whatsoever.  - Unsure what her baseline  Mental status is -- lives with daughter who also has COVID infection. - CT head : unremarkable. - She appears back to her baseline mental status.  Hypokalemia: -replete PO,IV -Recheck a.m. labs  Hypernatremia Due to poor PO intake -encourage water  Permanent atrial fibrillation: -Eliquis , HR controlled  HTN:  Resume Home Toprol XL 50 mg daily.   Generalized weakness: -PT eval-- may need short stay in SNF to recover (daughter is sick as well)  COVID-19 virus infection: Patient's daughter was offered outpatient clonal antibody treatment for her mother but felt that she could not tolerate a 3-hour day infusion and it was declined on the 18th.  -At this point patient does not appear to be terribly ill and does not appear to require remdesivir  DVT prophylaxis: Eliquis  Code Status: DNR Family Communication:  No one at bed  side. Disposition Plan:  It would be unsafe discharge, since her daughter is sick with Covid.  Anticipated discharge to [facility] on Monday.   Consultants:    None  Procedures: None  Antimicrobials: Anti-infectives (From admission, onward)   None      Subjective: Patient was seen and examined at bedside.  She appears comfortable, talking to her daughter on the phone.  No overnight events.  Not on oxygen, no increased work of breathing.  Objective: Vitals:   03/07/19 1752 03/07/19 1758 03/07/19 2300 03/08/19 0900  BP: (!) 165/129 (!) 165/129 (!) 163/99 (!) 164/104  Pulse: 81 81 86 96  Resp:  20 18 20   Temp:  97.7 F (36.5 C) 98.2 F (36.8 C) 98.5 F (36.9 C)  TempSrc:  Oral Oral Oral  SpO2:  98% 100% 97%  Weight:        Intake/Output Summary (Last 24 hours) at 03/08/2019 1254 Last data filed at 03/07/2019 2119 Gross per 24 hour  Intake --  Output 100 ml  Net -100 ml   Filed Weights   03/05/19 1800  Weight: 59.9 kg    Examination:  General exam: Appears calm and comfortable  Respiratory system: Clear to auscultation. Respiratory effort normal. Cardiovascular system: S1 & S2 heard, RRR. No JVD, murmurs, rubs, gallops or clicks. No pedal edema. Gastrointestinal system: Abdomen is nondistended, soft and nontender. No organomegaly or masses felt. Normal bowel sounds heard. Central nervous system: Alert and oriented. No focal neurological deficits. Extremities: No edema, No swelling or pain Skin: No rashes, lesions or  ulcers Psychiatry: Judgement and insight appear normal. Mood & affect appropriate.     Data Reviewed: I have personally reviewed following labs and imaging studies  CBC: Recent Labs  Lab 03/01/19 2122 03/01/19 2122 03/05/19 1514 03/06/19 0002 03/06/19 0816 03/07/19 0258 03/08/19 0900  WBC 9.1   < > 6.4 6.0 6.9 6.2 7.1  NEUTROABS 6.4  --  3.3  --   --   --   --   HGB 16.0*   < > 13.4 13.1 13.5 12.2 12.8  HCT 47.9*   < > 41.7 39.8 40.7  37.3 40.3  MCV 96.6   < > 100.2* 98.8 97.4 97.1 100.0  PLT 225   < > 276 271 245 227 176   < > = values in this interval not displayed.   Basic Metabolic Panel: Recent Labs  Lab 03/01/19 2122 03/01/19 2122 03/05/19 1514 03/06/19 0002 03/06/19 0816 03/07/19 0258 03/08/19 0900  NA 138  --  146*  --  147* 141 140  K 4.3  --  3.3*  --  3.0* 3.1* 4.7  CL 98  --  105  --  106 106 109  CO2 27  --  28  --  26 26 22   GLUCOSE 143*  --  99  --  86 104* 99  BUN 27*  --  51*  --  38* 26* 18  CREATININE 1.22*   < > 1.56* 1.28* 1.14* 0.85 0.92  CALCIUM 9.5  --  9.4  --  8.9 8.5* 8.6*  MG 1.7  --   --   --   --   --  1.6*  PHOS  --   --   --   --   --   --  2.0*   < > = values in this interval not displayed.   GFR: Estimated Creatinine Clearance: 32.2 mL/min (by C-G formula based on SCr of 0.92 mg/dL). Liver Function Tests: Recent Labs  Lab 03/05/19 1514 03/08/19 0900  AST 29 31  ALT 18 15  ALKPHOS 48 49  BILITOT 1.1 1.3*  PROT 6.1* 5.0*  ALBUMIN 2.9* 2.5*   No results for input(s): LIPASE, AMYLASE in the last 168 hours. No results for input(s): AMMONIA in the last 168 hours. Coagulation Profile: No results for input(s): INR, PROTIME in the last 168 hours. Cardiac Enzymes: No results for input(s): CKTOTAL, CKMB, CKMBINDEX, TROPONINI in the last 168 hours. BNP (last 3 results) No results for input(s): PROBNP in the last 8760 hours. HbA1C: No results for input(s): HGBA1C in the last 72 hours. CBG: No results for input(s): GLUCAP in the last 168 hours. Lipid Profile: Recent Labs    03/05/19 1514  TRIG 123   Thyroid Function Tests: No results for input(s): TSH, T4TOTAL, FREET4, T3FREE, THYROIDAB in the last 72 hours. Anemia Panel: Recent Labs    03/05/19 1514  FERRITIN 80   Sepsis Labs: Recent Labs  Lab 03/05/19 1500 03/05/19 1514 03/05/19 1625  PROCALCITON 0.14  --   --   LATICACIDVEN  --  1.2 1.4    Recent Results (from the past 240 hour(s))  SARS  CORONAVIRUS 2 (TAT 6-24 HRS) Nasopharyngeal Nasopharyngeal Swab     Status: Abnormal   Collection Time: 03/01/19  9:22 PM   Specimen: Nasopharyngeal Swab  Result Value Ref Range Status   SARS Coronavirus 2 POSITIVE (A) NEGATIVE Final    Comment: EMAILED TO Aretta Nip RN.@1052  ON 1.17.21 BY TCALDWELL MT. (NOTE) SARS-CoV-2 target nucleic acids  are DETECTED. The SARS-CoV-2 RNA is generally detectable in upper and lower respiratory specimens during the acute phase of infection. Positive results are indicative of the presence of SARS-CoV-2 RNA. Clinical correlation with patient history and other diagnostic information is  necessary to determine patient infection status. Positive results do not rule out bacterial infection or co-infection with other viruses.  The expected result is Negative. Fact Sheet for Patients: SugarRoll.be Fact Sheet for Healthcare Providers: https://www.woods-mathews.com/ This test is not yet approved or cleared by the [place] FDA and  has been authorized for detection and/or diagnosis of SARS-CoV-2 by FDA under an Emergency Use Authorization (EUA). This EUA will remain  in effect (meaning this test can be used) for the duration of the COVID-19  declaration under Section 564(b)(1) of the Act, 21 U.S.C. section 360bbb-3(b)(1), unless the authorization is terminated or revoked sooner. Performed at [facility], [place] [address] [place], [place] [address]   Blood Culture (routine x 2)     Status: None (Preliminary result)   Collection Time: 03/05/19  3:15 PM   Specimen: BLOOD  Result Value Ref Range Status   Specimen Description BLOOD RIGHT ANTECUBITAL  Final   Special Requests   Final    BOTTLES DRAWN AEROBIC AND ANAEROBIC Blood Culture results may not be optimal due to an excessive volume of blood received in culture bottles   Culture   Final    NO GROWTH 3 DAYS Performed at [facility], [place] [address] [place], [place] [address]    Report Status PENDING  Incomplete  Blood Culture (routine x 2)     Status: None (Preliminary result)   Collection Time: 03/05/19  5:21 PM   Specimen: BLOOD  Result Value Ref Range Status   Specimen Description BLOOD LEFT ANTECUBITAL  Final   Special Requests   Final    BOTTLES DRAWN AEROBIC AND ANAEROBIC Blood Culture adequate volume   Culture   Final    NO GROWTH 3 DAYS Performed at [facility], [place] [address], [place], [place] [address]    Report Status PENDING  Incomplete     Radiology Studies: No results found.   Scheduled Meds: . amitriptyline  25 mg Oral QHS  . apixaban  2.5 mg Oral BID  . metoprolol succinate  50 mg Oral Daily  . pantoprazole  20 mg Oral Daily  . phosphorus  250 mg Oral TID  . potassium chloride  40 mEq Oral BID  . senna  1 tablet Oral TID  . sodium chloride flush  3 mL Intravenous Q12H   Continuous Infusions: . sodium chloride    . magnesium sulfate bolus IVPB       LOS: 2 days    Time spent: 25 mins    Shawna Clamp, MD [facility]   If 7PM-7AM, please contact night-coverage

## 2019-03-08 NOTE — Discharge Instructions (Signed)

## 2019-03-09 LAB — BASIC METABOLIC PANEL
Anion gap: 9 (ref 5–15)
BUN: 15 mg/dL (ref 8–23)
CO2: 25 mmol/L (ref 22–32)
Calcium: 8.7 mg/dL — ABNORMAL LOW (ref 8.9–10.3)
Chloride: 107 mmol/L (ref 98–111)
Creatinine, Ser: 0.87 mg/dL (ref 0.44–1.00)
GFR calc Af Amer: >60 mL/min (ref >60)
GFR calc non Af Amer: 56 mL/min — ABNORMAL LOW (ref >60)
Glucose, Bld: 94 mg/dL (ref 70–99)
Potassium: 4.6 mmol/L (ref 3.5–5.1)
Sodium: 141 mmol/L (ref 135–145)

## 2019-03-09 LAB — MAGNESIUM: Magnesium: 1.9 mg/dL (ref 1.7–2.4)

## 2019-03-09 LAB — PHOSPHORUS: Phosphorus: 2.7 mg/dL (ref 2.5–4.6)

## 2019-03-09 MED ORDER — HYDRALAZINE HCL 20 MG/ML IJ SOLN: 10.0000 mg | Freq: Four times a day (QID) | INTRAMUSCULAR | Status: DC | PRN

## 2019-03-09 MED ORDER — HYDRALAZINE HCL 20 MG/ML IJ SOLN: 5.0000 mg | Freq: Four times a day (QID) | INTRAMUSCULAR | Status: DC | PRN

## 2019-03-09 NOTE — Progress Notes (Signed)
PROGRESS NOTE    [name]  [id number] DOB: Mar 30, 1920 DOA: 03/05/2019 PCP: Lawerance Cruel, MD   Brief Narrative: [name] is a 86 y.o. female with medical history significant ofatrial fibrillation on Eliquis, ventricular hypertrophy, breast cancer in the distant past, gastroesophageal reflux disease, and mild dementia who presents from home due to profound weakness. Home health worker went to see the patient at home and found her to be in bed lying in her own urine. On 1/18 the patient tested positive for COVID-19 and was sent home in the care of her daughter. Apparently her daughter is sick and has COVID-19 as well and has been unable to take care of the patient. Patient has bed offer at [facility].  Assessment & Plan:   Principal Problem:   Encephalopathy due to COVID-19 virus Active Problems:   Permanent atrial fibrillation (HCC)   Generalized weakness   COVID-19 virus infection   COVID-19   DNR (do not resuscitate)  # Encephalopathy due to COVID-19 virus: Improving - Patient unable to help herself whatsoever.  - Unsure what her baseline Mental status is -- lives with daughter who also has COVID infection. - CT head : unremarkable. - She appears back to her baseline mental status.  Hypokalemia: - Resolved. -replete PO,IV -Recheck a.m. labs  Hypernatremia - Resolved. Due to poor PO intake -encourage water  Permanent atrial fibrillation: -Eliquis , HR controlled  HTN:  Resume Home Toprol XL 50 mg daily.   Generalized weakness: -PT eval-- may need short stay in SNF to recover (daughter is sick as well)  COVID-19 virus infection:  Patient's daughter was offered outpatient clonal antibody treatment for her mother but felt that she could not tolerate a 3-hour day infusion and it was declined on the 18th.  -At this point patient does not appear to be terribly ill and does not appear to require remdesivir. -She is at room air , sats  96%,  not requiring any oxygen.  DVT prophylaxis: Eliquis  Code Status: DNR Family Communication: Discussed with patient in detail.. Disposition Plan:  It would be unsafe discharge, since her daughter is sick with Covid.   Anticipated discharge to [facility] on Monday.   Consultants:    None  Procedures: None  Antimicrobials: Anti-infectives (From admission, onward)   None      Subjective: Patient was seen and examined at bedside.  She appears comfortable, talking to her daughter on the phone.  No overnight events.  Not on oxygen, no increased work of breathing.  Objective: Vitals:   03/08/19 1824 03/08/19 2225 03/09/19 0831 03/09/19 0833  BP: (!) 163/87 (!) 140/97 (!) 170/100 (!) 170/100  Pulse: (!) 59 80 77 77  Resp: (!) 22 16 18    Temp: 97.6 F (36.4 C) 97.6 F (36.4 C)    TempSrc:  Oral    SpO2: 92% 100% 95% 95%  Weight:       No intake or output data in the 24 hours ending 03/09/19 1203 Filed Weights   03/05/19 1800  Weight: 59.9 kg    Examination:  General exam: Appears calm and comfortable  Respiratory system: Clear to auscultation. Respiratory effort normal. Cardiovascular system: S1 & S2 heard, RRR. No JVD, murmurs, rubs, gallops or clicks. No pedal edema. Gastrointestinal system: Abdomen is nondistended, soft and nontender. No organomegaly or masses felt. Normal bowel sounds heard. Central nervous system: Alert and oriented. No focal neurological deficits. Extremities: No edema, No swelling or pain Skin: No rashes,  lesions or ulcers Psychiatry: Judgement and insight appear normal. Mood & affect appropriate.     Data Reviewed: I have personally reviewed following labs and imaging studies  CBC: Recent Labs  Lab 03/05/19 1514 03/06/19 0002 03/06/19 0816 03/07/19 0258 03/08/19 0900  WBC 6.4 6.0 6.9 6.2 7.1  NEUTROABS 3.3  --   --   --   --   HGB 13.4 13.1 13.5 12.2 12.8  HCT 41.7 39.8 40.7 37.3 40.3  MCV 100.2* 98.8 97.4 97.1 100.0  PLT  276 271 245 227 0000000   Basic Metabolic Panel: Recent Labs  Lab 03/05/19 1514 03/05/19 1514 03/06/19 0002 03/06/19 0816 03/07/19 0258 03/08/19 0900 03/09/19 0438  NA 146*  --   --  147* 141 140 141  K 3.3*  --   --  3.0* 3.1* 4.7 4.6  CL 105  --   --  106 106 109 107  CO2 28  --   --  26 26 22 25   GLUCOSE 99  --   --  86 104* 99 94  BUN 51*  --   --  38* 26* 18 15  CREATININE 1.56*   < > 1.28* 1.14* 0.85 0.92 0.87  CALCIUM 9.4  --   --  8.9 8.5* 8.6* 8.7*  MG  --   --   --   --   --  1.6* 1.9  PHOS  --   --   --   --   --  2.0* 2.7   < > = values in this interval not displayed.   GFR: Estimated Creatinine Clearance: 34 mL/min (by C-G formula based on SCr of 0.87 mg/dL). Liver Function Tests: Recent Labs  Lab 03/05/19 1514 03/08/19 0900  AST 29 31  ALT 18 15  ALKPHOS 48 49  BILITOT 1.1 1.3*  PROT 6.1* 5.0*  ALBUMIN 2.9* 2.5*   No results for input(s): LIPASE, AMYLASE in the last 168 hours. No results for input(s): AMMONIA in the last 168 hours. Coagulation Profile: No results for input(s): INR, PROTIME in the last 168 hours. Cardiac Enzymes: No results for input(s): CKTOTAL, CKMB, CKMBINDEX, TROPONINI in the last 168 hours. BNP (last 3 results) No results for input(s): PROBNP in the last 8760 hours. HbA1C: No results for input(s): HGBA1C in the last 72 hours. CBG: No results for input(s): GLUCAP in the last 168 hours. Lipid Profile: No results for input(s): CHOL, HDL, LDLCALC, TRIG, CHOLHDL, LDLDIRECT in the last 72 hours. Thyroid Function Tests: No results for input(s): TSH, T4TOTAL, FREET4, T3FREE, THYROIDAB in the last 72 hours. Anemia Panel: No results for input(s): VITAMINB12, FOLATE, FERRITIN, TIBC, IRON, RETICCTPCT in the last 72 hours. Sepsis Labs: Recent Labs  Lab 03/05/19 1500 03/05/19 1514 03/05/19 1625  PROCALCITON 0.14  --   --   LATICACIDVEN  --  1.2 1.4    Recent Results (from the past 240 hour(s))  SARS CORONAVIRUS 2 (TAT 6-24 HRS)  Nasopharyngeal Nasopharyngeal Swab     Status: Abnormal   Collection Time: 03/01/19  9:22 PM   Specimen: Nasopharyngeal Swab  Result Value Ref Range Status   SARS Coronavirus 2 POSITIVE (A) NEGATIVE Final    Comment: EMAILED TO Aretta Nip RN.@[email or web address]  ON 1.17.21 BY TCALDWELL MT. (NOTE) SARS-CoV-2 target nucleic acids are DETECTED. The SARS-CoV-2 RNA is generally detectable in upper and lower respiratory specimens during the acute phase of infection. Positive results are indicative of the presence of SARS-CoV-2 RNA. Clinical correlation with patient history and other diagnostic  information is  necessary to determine patient infection status. Positive results do not rule out bacterial infection or co-infection with other viruses.  The expected result is Negative. Fact Sheet for Patients: SugarRoll.be Fact Sheet for Healthcare Providers: https://www.woods-mathews.com/ This test is not yet approved or cleared by the [place] FDA and  has been authorized for detection and/or diagnosis of SARS-CoV-2 by FDA under an Emergency Use Authorization (EUA). This EUA will remain  in effect (meaning this test can be used) for the duration of the COVID-19  declaration under Section 564(b)(1) of the Act, 21 U.S.C. section 360bbb-3(b)(1), unless the authorization is terminated or revoked sooner. Performed at [facility], [place] [address], [place], [place] [address]   Blood Culture (routine x 2)     Status: None (Preliminary result)   Collection Time: 03/05/19  3:15 PM   Specimen: BLOOD  Result Value Ref Range Status   Specimen Description BLOOD RIGHT ANTECUBITAL  Final   Special Requests   Final    BOTTLES DRAWN AEROBIC AND ANAEROBIC Blood Culture results may not be optimal due to an excessive volume of blood received in culture bottles   Culture   Final    NO GROWTH 4 DAYS Performed at [facility], [place] [address] [place], [place] [address]      Report Status PENDING  Incomplete  Blood Culture (routine x 2)     Status: None (Preliminary result)   Collection Time: 03/05/19  5:21 PM   Specimen: BLOOD  Result Value Ref Range Status   Specimen Description BLOOD LEFT ANTECUBITAL  Final   Special Requests   Final    BOTTLES DRAWN AEROBIC AND ANAEROBIC Blood Culture adequate volume   Culture   Final    NO GROWTH 4 DAYS Performed at [facility], [place] [address] [place],  [address]    Report Status PENDING  Incomplete     Radiology Studies: No results found.   Scheduled Meds: . amitriptyline  25 mg Oral QHS  . apixaban  2.5 mg Oral BID  . metoprolol succinate  50 mg Oral Daily  . pantoprazole  20 mg Oral Daily  . phosphorus  250 mg Oral TID  . senna  1 tablet Oral TID  . sodium chloride flush  3 mL Intravenous Q12H   Continuous Infusions: . sodium chloride       LOS: 3 days    Time spent: 25 mins    Shawna Clamp, MD [facility]   If 7PM-7AM, please contact night-coverage

## 2019-03-10 DIAGNOSIS — I4821 Permanent atrial fibrillation: Secondary | ICD-10-CM

## 2019-03-10 LAB — CULTURE, BLOOD (ROUTINE X 2)
Culture: NO GROWTH
Culture: NO GROWTH
Special Requests: ADEQUATE

## 2019-03-10 LAB — BASIC METABOLIC PANEL
Anion gap: 10 (ref 5–15)
BUN: 13 mg/dL (ref 8–23)
CO2: 24 mmol/L (ref 22–32)
Calcium: 8.5 mg/dL — ABNORMAL LOW (ref 8.9–10.3)
Chloride: 107 mmol/L (ref 98–111)
Creatinine, Ser: 1.02 mg/dL — ABNORMAL HIGH (ref 0.44–1.00)
GFR calc Af Amer: 54 mL/min — ABNORMAL LOW (ref >60)
GFR calc non Af Amer: 46 mL/min — ABNORMAL LOW (ref >60)
Glucose, Bld: 87 mg/dL (ref 70–99)
Potassium: 4.9 mmol/L (ref 3.5–5.1)
Sodium: 141 mmol/L (ref 135–145)

## 2019-03-10 MED ORDER — ACETAMINOPHEN 325 MG PO TABS: 650.0000 mg | ORAL_TABLET | Freq: Four times a day (QID) | ORAL | Status: AC | PRN

## 2019-03-10 MED ORDER — APIXABAN 2.5 MG PO TABS: 2.5000 mg | ORAL_TABLET | Freq: Two times a day (BID) | ORAL | Status: AC

## 2019-03-10 NOTE — Plan of Care (Signed)

## 2019-03-10 NOTE — Discharge Summary (Signed)
[name], is a 86 y.o. female  DOB 02/12/1921  MRN [id number].  Admission date:  03/05/2019  Admitting Physician  Geradine Girt, DO  Discharge Date:  03/10/2019   Primary MD  Lawerance Cruel, MD  Recommendations for primary care physician for things to follow:  -Please check CBC, CMP in 1 week. -Consider resuming home dose Lasix if she develops any signs of volume overload.  CODE STATUS: DNR    Admission Diagnosis  Failure to thrive in adult [R62.7] Encephalopathy due to COVID-19 virus [U07.1, G93.49] COVID-19 [U07.1]   Discharge Diagnosis  Failure to thrive in adult [R62.7] Encephalopathy due to COVID-19 virus [U07.1, G93.49] COVID-19 [U07.1]    Principal Problem:   Encephalopathy due to COVID-19 virus Active Problems:   Permanent atrial fibrillation (HCC)   Generalized weakness   COVID-19 virus infection   COVID-19   DNR (do not resuscitate)      Past Medical History:  Diagnosis Date  . Arthritis   . Breast cancer ([place])   . Chronic back pain   . GERD (gastroesophageal reflux disease)   . History of radiation therapy 08/10/17- 09/07/17   Left Breast 28.50 Gy in 5 fractions given on a weekly basis.   . Hypertension   . UTI (urinary tract infection)     Past Surgical History:  Procedure Laterality Date  . ABDOMINAL HYSTERECTOMY     she still has one ovary in place.   [facility] BREAST LUMPECTOMY    . BREAST LUMPECTOMY WITH RADIOACTIVE SEED LOCALIZATION Left 06/22/2017   Procedure: BREAST LUMPECTOMY WITH RADIOACTIVE SEED LOCALIZATION;  Surgeon: [name], MD;  Location: [facility];  Service: General;  Laterality: Left;  . CHOLECYSTECTOMY    . HERNIA REPAIR         History of present illness and  Hospital Course:     Kindly see H&P for history of present illness and admission details, please review complete Labs, Consult reports and Test reports for all  details in brief  HPI  from the history and physical done on the day of admission 03/05/2019   HPI: [name] is a 86 y.o. female with medical history significant of atrial fibrillation on Eliquis, ventricular hypertrophy, breast cancer in the distant past, gastroesophageal reflux disease, and mild dementia who presents from home due to profound weakness.  Home health worker went to see the patient today and found her to be in bed lying in her own urine.  On 118 the patient tested positive for COVID-19 and was sent home in the care of her daughter.  Apparently her daughter is sick and has COVID-19 as well and has been unable to take care of the patient.  We are unsure when the patient last ate or drank anything.  Patient currently denies any pain.  She is stating that she is hungry and thirsty.  Is only oriented to person and that is apparently her baseline. I called and spoke to the patient's daughter who stated that she is  sick herself and thinks she might have Covid and that they were exposed by other family members who visited for Christmas.  24 hours after they returned home they were diagnosed with COVID-19.  Subsequently the patient and her daughter both became ill and the patient tested positive on January 18.  ED Course: Inflammatory markers reassuring, patient unable to give any history, referred to [place] due to unsafe discharge [place] a 86 y.o.femalewith medical history significant ofatrial fibrillation on Eliquis, ventricular hypertrophy, breast cancer in the distant past, gastroesophageal reflux disease, and mild dementia who presents from home due to profound weakness. Home health worker went to see the patient at home and found her to be in bed lying in her own urine. On 1/18 the patient tested positive for COVID-19 and was sent home in the care of her daughter. Apparently her daughter is sick and has COVID-19 as well and has been unable to  take care of the patient. Patient has bed offer at [facility].   # Encephalopathy due to COVID-19 virus: Improving -Mentation has significantly improved, but patient is significantly weak , PLAN  to discharge to SNF -CT head unremarkable.  Hypokalemia: - Resolved.  Hypernatremia - Resolved. Due to poor PO intake  Permanent atrial fibrillation: -Eliquis, dose has been adjusted to renal function, she will be discharged on 2.5 mg p.o. twice daily .heart rate controlled on metoprolol.  HTN:  Resume Home Toprol XL 50 mg daily.   Generalized weakness: -PT eval-- WILL need short stay in SNF to recover (daughter is sick as well)  COVID-19 virus infection:  Patient's daughter was offered outpatient clonal antibody treatment for her mother but felt that she could not tolerate a 3-hour day infusion and it was declined on the 18th. -Patient with no hypoxia, did not require any remdesivir or steroids. -She is at room air , sats 96%,  not requiring any oxygen.   Discharge Condition:  Stable      Discharge Instructions  and  Discharge Medications     Discharge Instructions    Diet - low sodium heart healthy   Complete by: As directed    Discharge instructions   Complete by: As directed    Follow with Primary MD Lawerance Cruel, MD after DC from SNF  Get CBC, CMP,  checked  by Primary MD next visit.    Activity: As tolerated with Full fall precautions use walker/cane & assistance as needed   Disposition SNF   Diet: Heart Healthy  , with feeding assistance and aspiration precautions.  For Heart failure patients - Check your Weight same time everyday, if you gain over 2 pounds, or you develop in leg swelling, experience more shortness of breath or chest pain, call your Primary MD immediately. Follow Cardiac Low Salt Diet and 1.5 lit/day fluid restriction.   On your next visit with your primary care physician please Get Medicines reviewed and  adjusted.   Please request your Prim.MD to go over all Hospital Tests and Procedure/Radiological results at the follow up, please get all Hospital records sent to your Prim MD by signing hospital release before you go home.   If you experience worsening of your admission symptoms, develop shortness of breath, life threatening emergency, suicidal or homicidal thoughts you must seek medical attention immediately by calling 911 or calling your MD immediately  if symptoms less severe.  You Must read complete instructions/literature along with all the possible adverse reactions/side effects  for all the Medicines you take and that have been prescribed to you. Take any new Medicines after you have completely understood and accpet all the possible adverse reactions/side effects.   Do not drive, operating heavy machinery, perform activities at heights, swimming or participation in water activities or provide baby sitting services if your were admitted for syncope or siezures until you have seen by Primary MD or a Neurologist and advised to do so again.  Do not drive when taking Pain medications.    Do not take more than prescribed Pain, Sleep and Anxiety Medications  Special Instructions: If you have smoked or chewed Tobacco  in the last 2 yrs please stop smoking, stop any regular Alcohol  and or any Recreational drug use.  Wear Seat belts while driving.   Please note  You were cared for by a hospitalist during your hospital stay. If you have any questions about your discharge medications or the care you received while you were in the hospital after you are discharged, you can call the unit and asked to speak with the hospitalist on call if the hospitalist that took care of you is not available. Once you are discharged, your primary care physician will handle any further medical issues. Please note that NO REFILLS for any discharge medications will be authorized once you are discharged, as it is  imperative that you return to your primary care physician (or establish a relationship with a primary care physician if you do not have one) for your aftercare needs so that they can reassess your need for medications and monitor your lab values.   Increase activity slowly   Complete by: As directed      Allergies as of 03/10/2019      Reactions   Codeine Nausea And Vomiting      Medication List    TAKE these medications   acetaminophen 325 MG tablet Commonly known as: TYLENOL Take 2 tablets (650 mg total) by mouth every 6 (six) hours as needed for mild pain (or Fever >/= 101).   amitriptyline 25 MG tablet Commonly known as: ELAVIL Take 25 mg by mouth at bedtime.   apixaban 2.5 MG Tabs tablet Commonly known as: ELIQUIS Take 1 tablet (2.5 mg total) by mouth 2 (two) times daily. What changed:   medication strength  how much to take  additional instructions   furosemide 20 MG tablet Commonly known as: LASIX Take 1 tablet by mouth once daily   metoprolol succinate 50 MG 24 hr tablet Commonly known as: TOPROL-XL Take 1 tablet by mouth once daily   pantoprazole 40 MG tablet Commonly known as: PROTONIX Take 40 mg by mouth daily.   senna 8.6 MG tablet Commonly known as: SENOKOT Take 1 tablet by mouth 3 (three) times daily.   WOMENS MULTI PO Take 1 tablet by mouth daily.         Diet and Activity recommendation: See Discharge Instructions above   Consults obtained - None   Major procedures and Radiology Reports - PLEASE review detailed and final reports for all details, in brief -      CT Head Wo Contrast  Result Date: 03/01/2019 CLINICAL DATA:  86 year old female with head trauma. EXAM: CT HEAD WITHOUT CONTRAST CT CERVICAL SPINE WITHOUT CONTRAST TECHNIQUE: Multidetector CT imaging of the head and cervical spine was performed following the standard protocol without intravenous contrast. Multiplanar CT image reconstructions of the cervical spine were also  generated. COMPARISON:  None. FINDINGS: CT HEAD FINDINGS  Brain: There is moderate age-related atrophy and chronic microvascular ischemic changes. There is no acute intracranial hemorrhage. No mass effect or midline shift. No extra-axial fluid collection. Vascular: No hyperdense vessel or unexpected calcification. Skull: Normal. Negative for fracture or focal lesion. Sinuses/Orbits: There is diffuse mucoperiosteal thickening paranasal sinuses. No air-fluid level. The mastoid air cells are clear. Other: None CT CERVICAL SPINE FINDINGS Alignment: No acute subluxation. There is grade 1 C7-T1 anterolisthesis Skull base and vertebrae: No acute fracture.  Osteopenia Soft tissues and spinal canal: No prevertebral fluid or swelling. No visible canal hematoma. Disc levels: Multilevel degenerative changes most prominent at C5-C6 with endplate irregularity and disc space narrowing. Upper chest: Negative. Other: Bilateral carotid bulb calcified plaques. IMPRESSION: 1. No acute intracranial hemorrhage. Age-related atrophy and chronic microvascular ischemic changes. 2. No acute/traumatic cervical spine pathology. Degenerative changes. Electronically Signed   By: Anner Crete M.D.   On: 03/01/2019 22:03   CT Cervical Spine Wo Contrast  Result Date: 03/01/2019 CLINICAL DATA:  86 year old female with head trauma. EXAM: CT HEAD WITHOUT CONTRAST CT CERVICAL SPINE WITHOUT CONTRAST TECHNIQUE: Multidetector CT imaging of the head and cervical spine was performed following the standard protocol without intravenous contrast. Multiplanar CT image reconstructions of the cervical spine were also generated. COMPARISON:  None. FINDINGS: CT HEAD FINDINGS Brain: There is moderate age-related atrophy and chronic microvascular ischemic changes. There is no acute intracranial hemorrhage. No mass effect or midline shift. No extra-axial fluid collection. Vascular: No hyperdense vessel or unexpected calcification. Skull: Normal. Negative for  fracture or focal lesion. Sinuses/Orbits: There is diffuse mucoperiosteal thickening paranasal sinuses. No air-fluid level. The mastoid air cells are clear. Other: None CT CERVICAL SPINE FINDINGS Alignment: No acute subluxation. There is grade 1 C7-T1 anterolisthesis Skull base and vertebrae: No acute fracture.  Osteopenia Soft tissues and spinal canal: No prevertebral fluid or swelling. No visible canal hematoma. Disc levels: Multilevel degenerative changes most prominent at C5-C6 with endplate irregularity and disc space narrowing. Upper chest: Negative. Other: Bilateral carotid bulb calcified plaques. IMPRESSION: 1. No acute intracranial hemorrhage. Age-related atrophy and chronic microvascular ischemic changes. 2. No acute/traumatic cervical spine pathology. Degenerative changes. Electronically Signed   By: Anner Crete M.D.   On: 03/01/2019 22:03   DG Chest Port 1 View  Result Date: 03/05/2019 CLINICAL DATA:  Found unresponsive, history of COVID-19 positivity EXAM: PORTABLE CHEST 1 VIEW COMPARISON:  03/01/2018 FINDINGS: Cardiac shadow is stable. Aortic calcifications are noted. Elevation of the right hemidiaphragm is seen. Mild right basilar atelectasis is noted. Previously noted infiltrate and effusion on the left has cleared. No bony abnormality is noted. IMPRESSION: Mild right basilar atelectasis. Electronically Signed   By: Inez Catalina M.D.   On: 03/05/2019 14:54    Micro Results   Recent Results (from the past 240 hour(s))  SARS CORONAVIRUS 2 (TAT 6-24 HRS) Nasopharyngeal Nasopharyngeal Swab     Status: Abnormal   Collection Time: 03/01/19  9:22 PM   Specimen: Nasopharyngeal Swab  Result Value Ref Range Status   SARS Coronavirus 2 POSITIVE (A) NEGATIVE Final    Comment: EMAILED TO Aretta Nip RN.@1052  ON 1.17.21 BY TCALDWELL MT. (NOTE) SARS-CoV-2 target nucleic acids are DETECTED. The SARS-CoV-2 RNA is generally detectable in upper and lower respiratory specimens during the acute  phase of infection. Positive results are indicative of the presence of SARS-CoV-2 RNA. Clinical correlation with patient history and other diagnostic information is  necessary to determine patient infection status. Positive results do not rule out bacterial infection  or co-infection with other viruses.  The expected result is Negative. Fact Sheet for Patients: SugarRoll.be Fact Sheet for Healthcare Providers: https://www.woods-mathews.com/ This test is not yet approved or cleared by the [place] FDA and  has been authorized for detection and/or diagnosis of SARS-CoV-2 by FDA under an Emergency Use Authorization (EUA). This EUA will remain  in effect (meaning this test can be used) for the duration of the COVID-19  declaration under Section 564(b)(1) of the Act, 21 U.S.C. section 360bbb-3(b)(1), unless the authorization is terminated or revoked sooner. Performed at [facility], [place] [address], [place], [place] [address]   Blood Culture (routine x 2)     Status: None   Collection Time: 03/05/19  3:15 PM   Specimen: BLOOD  Result Value Ref Range Status   Specimen Description BLOOD RIGHT ANTECUBITAL  Final   Special Requests   Final    BOTTLES DRAWN AEROBIC AND ANAEROBIC Blood Culture results may not be optimal due to an excessive volume of blood received in culture bottles   Culture   Final    NO GROWTH 5 DAYS Performed at [facility], [place] [address] [place], [place] [address]    Report Status 03/10/2019 FINAL  Final  Blood Culture (routine x 2)     Status: None   Collection Time: 03/05/19  5:21 PM   Specimen: BLOOD  Result Value Ref Range Status   Specimen Description BLOOD LEFT ANTECUBITAL  Final   Special Requests   Final    BOTTLES DRAWN AEROBIC AND ANAEROBIC Blood Culture adequate volume   Culture   Final    NO GROWTH 5 DAYS Performed at [facility], [place] [address] [place], [place] [address]    Report  Status 03/10/2019 FINAL  Final       Today   Subjective:   [name] today has no headache,no chest or  abdominal pain ports she has some generalized weakness, but denies any dyspnea, or chest pain, no fever or chills .  Objective:   Blood pressure (!) 145/84, pulse 84, temperature 98.5 F (36.9 C), temperature source Oral, resp. rate 18, weight 59.9 kg, SpO2 97 %.   Intake/Output Summary (Last 24 hours) at 03/10/2019 1223 Last data filed at 03/09/2019 2100 Gross per 24 hour  Intake --  Output 2 ml  Net -2 ml    Exam Awake Alert, Oriented , pleasant, conversant and appropriate  .AT,PERRAL Symmetrical Chest wall movement, Good air movement bilaterally, CTAB RRR,No Gallops,Rubs or new Murmurs, No Parasternal Heave +ve B.Sounds, Abd Soft, Non tender, No rebound -guarding or rigidity. No Cyanosis, Clubbing or edema, No new Rash or bruise  Data Review   CBC w Diff:  Lab Results  Component Value Date   WBC 7.1 03/08/2019   HGB 12.8 03/08/2019   HGB 13.6 10/29/2018   HCT 40.3 03/08/2019   HCT 39.7 10/29/2018   PLT 176 03/08/2019   PLT 247 10/29/2018   LYMPHOPCT 34 03/05/2019   MONOPCT 11 03/05/2019   EOSPCT 1 03/05/2019   BASOPCT 0 03/05/2019    CMP:  Lab Results  Component Value Date   NA 141 03/10/2019   K 4.9 03/10/2019   CL 107 03/10/2019   CO2 24 03/10/2019   BUN 13 03/10/2019   CREATININE 1.02 (H) 03/10/2019   CREATININE 1.31 (H) 05/23/2017   PROT 5.0 (L) 03/08/2019   ALBUMIN 2.5 (L) 03/08/2019   BILITOT 1.3 (H) 03/08/2019   BILITOT 0.4 05/23/2017  ALKPHOS 49 03/08/2019   AST 31 03/08/2019   AST 20 05/23/2017   ALT 15 03/08/2019   ALT 13 05/23/2017  .   Total Time in preparing paper work, data evaluation and todays exam - 7 minutes  Phillips Climes M.D on 03/10/2019 at 12:23 PM  [facility]   Office  [phone]

## 2019-03-10 NOTE — TOC Transition Note (Signed)
Transition of Care [facility]) - CM/SW Discharge Note   Patient Details  Name: [name] MRN: [id number] Date of Birth: 01/24/1921  [facility] CM/SW Contact:  Benard Halsted, LCSW Phone Number: 03/10/2019, 12:57 PM   Clinical Narrative:    Patient will DC to: [facility] Anticipated DC date: 03/10/19 Family notified: Daughter, Chief Strategy Officer by: Corey Harold   Per MD patient ready for DC to [facility]. RN, patient, patient's family, and facility notified of DC. Discharge Summary and FL2 sent to facility. RN to call report prior to discharge [phone] Room 806A). DC packet on chart. Ambulance transport requested for patient.   CSW will sign off for now as social work intervention is no longer needed. Please consult [place] again if new needs arise.  Cedric Fishman, LCSW Clinical Social Worker [phone]    Final next level of care: Skilled Nursing Facility Barriers to Discharge: No Barriers Identified   Patient Goals and CMS Choice Patient states their goals for this hospitalization and ongoing recovery are:: Rehab CMS Medicare.gov Compare Post Acute Care list provided to:: Patient Represenative (must comment)(Daughter, [name] (pt with confusion per RN)) Choice offered to / list presented to : Adult Children  Discharge Placement   Existing PASRR number confirmed : 03/10/19            Patient to be transferred to facility by: [facility] Name of family member notified: Daughter, [name] Patient and family notified of of transfer: 03/10/19  Discharge Plan and Services In-house Referral: Clinical Social Work   Post Acute Care Choice: [place]                               Social Determinants of Health (SDOH) Interventions     Readmission Risk Interventions No flowsheet data found.

## 2019-03-10 NOTE — Progress Notes (Signed)
Physical Therapy Treatment Patient Details Name: [name] MRN: [id number] DOB: [birth year] Today's Date: 03/10/2019    History of Present Illness [name] is a 86 y.o. female with medical history significant of atrial fibrillation on Eliquis, ventricular hypertrophy, breast cancer in the distant past, gastroesophageal reflux disease, and mild dementia who presents from home due to profound weakness.  She tested positive for Covid-19 on 03/03/19 and went home with her daughter who was unable to care for her due to testig positive herself.  Admitted with FTT/dehydration.    PT Comments    Pt admitted with above diagnosis. Pt was able to ambulate a short distance to the 3N1 and then back to bed.  Pt needed assist to be cleaned.  Totally fatigued after only walking to 3N1 and back to bed.  Pt currently with functional limitations due to the deficits listed below (see PT Problem List). Pt will benefit from skilled PT to increase their independence and safety with mobility to allow discharge to the venue listed below.     Follow Up Recommendations  SNF     Equipment Recommendations  None recommended by PT    Recommendations for Other Services       Precautions / Restrictions Precautions Precautions: Fall Restrictions Weight Bearing Restrictions: No    Mobility  Bed Mobility Overal bed mobility: Needs Assistance Bed Mobility: Supine to Sit;Sit to Supine Rolling: Supervision   Supine to sit: Supervision Sit to supine: Supervision   General bed mobility comments: Pt was standing at EOB on arrival and had crawled over the bed rail trying to get to the 3N1.   Transfers Overall transfer level: Needs assistance   Transfers: Sit to/from Stand Sit to Stand: Supervision;Min guard         General transfer comment: Pt needs min guard assist as she reaches for furniture/bed anything she can get support from .    Ambulation/Gait Ambulation/Gait assistance: Min  assist Gait Distance (Feet): 10 Feet(5 feet x 2) Assistive device: 1 person hand held assist Gait Pattern/deviations: Step-through pattern;Decreased stride length;Staggering left;Staggering right;Drifts right/left;Trunk flexed;Wide base of support     General Gait Details: Pt already headed to 3N1 on PT arrival, with BM on floor.  PT assisted pt to 3N1 and got pt cleaned up.  Pt was able to stand with bil UIE support to be cleaned and then walked back to bed with min assist to steady with pt definitely needing UE support.     Stairs             Wheelchair Mobility    Modified Rankin (Stroke Patients Only)       Balance Overall balance assessment: Needs assistance Sitting-balance support: No upper extremity supported;Feet supported Sitting balance-Leahy Scale: Fair Sitting balance - Comments: min guard to close supervision statically   Standing balance support: Bilateral upper extremity supported Standing balance-Leahy Scale: Poor Standing balance comment: Pt needed single versus bil UE support for balance in standing for stability.                             Cognition Arousal/Alertness: Awake/alert Behavior During Therapy: Anxious Overall Cognitive Status: No family/caregiver present to determine baseline cognitive functioning                                 General Comments: history of dementia, oriented to self and reoriented  to place/situation, poor attention and problem sovling       Exercises      General Comments General comments (skin integrity, edema, etc.): incontinent of bowels. did use the 3n1 and called NT to replace purewick.        Pertinent Vitals/Pain      Home Living                      Prior Function            PT Goals (current goals can now be found in the care plan section) Acute Rehab PT Goals Patient Stated Goal: to go home Progress towards PT goals: Progressing toward goals    Frequency     Min 2X/week      PT Plan Current plan remains appropriate    Co-evaluation              AM-PAC PT "6 Clicks" Mobility   Outcome Measure  Help needed turning from your back to your side while in a flat bed without using bedrails?: None Help needed moving from lying on your back to sitting on the side of a flat bed without using bedrails?: None Help needed moving to and from a bed to a chair (including a wheelchair)?: A Little Help needed standing up from a chair using your arms (e.g., wheelchair or bedside chair)?: A Lot Help needed to walk in hospital room?: A Lot Help needed climbing 3-5 steps with a railing? : Total 6 Click Score: 16    End of Session   Activity Tolerance: Patient limited by fatigue Patient left: in bed;with call bell/phone within reach;with bed alarm set Nurse Communication: Mobility status(purewick) PT Visit Diagnosis: Muscle weakness (generalized) (M62.81);Other abnormalities of gait and mobility (R26.89)     Time: [id number] PT Time Calculation (min) (ACUTE ONLY): 13 min  Charges:  $Gait Training: 8-22 mins                     Domani Bakos W,PT [place] Pager:  [phone]  Office:  [place] 03/10/2019, 1:39 PM

## 2019-03-10 NOTE — Progress Notes (Signed)
Report called and given to [place], RN at [facility] [phone]) for patient going to room 806A.  Called daughter [name] and let here know of transport arrangements today.

## 2019-03-22 ENCOUNTER — Emergency Department (HOSPITAL_COMMUNITY)
Admission: EM | Admit: 2019-03-22 | Discharge: 2019-03-22 | Disposition: A | Discharge status: Home / Self Care | Payer: Medicare Other | Attending: Emergency Medicine | Admitting: Emergency Medicine

## 2019-03-22 ENCOUNTER — Emergency Department (HOSPITAL_COMMUNITY): Payer: Medicare Other

## 2019-03-22 DIAGNOSIS — Y999 Unspecified external cause status: Secondary | ICD-10-CM | POA: Insufficient documentation

## 2019-03-22 DIAGNOSIS — W19XXXA Unspecified fall, initial encounter: Secondary | ICD-10-CM | POA: Insufficient documentation

## 2019-03-22 DIAGNOSIS — R531 Weakness: Secondary | ICD-10-CM | POA: Insufficient documentation

## 2019-03-22 DIAGNOSIS — I10 Essential (primary) hypertension: Secondary | ICD-10-CM | POA: Insufficient documentation

## 2019-03-22 DIAGNOSIS — Z8616 Personal history of COVID-19: Secondary | ICD-10-CM | POA: Insufficient documentation

## 2019-03-22 DIAGNOSIS — I4891 Unspecified atrial fibrillation: Secondary | ICD-10-CM | POA: Diagnosis not present

## 2019-03-22 DIAGNOSIS — Y92129 Unspecified place in nursing home as the place of occurrence of the external cause: Secondary | ICD-10-CM | POA: Insufficient documentation

## 2019-03-22 DIAGNOSIS — Z87440 Personal history of urinary (tract) infections: Secondary | ICD-10-CM | POA: Insufficient documentation

## 2019-03-22 DIAGNOSIS — Z79899 Other long term (current) drug therapy: Secondary | ICD-10-CM | POA: Insufficient documentation

## 2019-03-22 DIAGNOSIS — Z7901 Long term (current) use of anticoagulants: Secondary | ICD-10-CM | POA: Insufficient documentation

## 2019-03-22 DIAGNOSIS — Y9389 Activity, other specified: Secondary | ICD-10-CM | POA: Insufficient documentation

## 2019-03-22 LAB — BASIC METABOLIC PANEL
Anion gap: 14 (ref 5–15)
BUN: 21 mg/dL (ref 8–23)
CO2: 28 mmol/L (ref 22–32)
Calcium: 9.5 mg/dL (ref 8.9–10.3)
Chloride: 98 mmol/L (ref 98–111)
Creatinine, Ser: 1.46 mg/dL — ABNORMAL HIGH (ref 0.44–1.00)
GFR calc Af Amer: 35 mL/min — ABNORMAL LOW (ref >60)
GFR calc non Af Amer: 30 mL/min — ABNORMAL LOW (ref >60)
Glucose, Bld: 157 mg/dL — ABNORMAL HIGH (ref 70–99)
Potassium: 4 mmol/L (ref 3.5–5.1)
Sodium: 140 mmol/L (ref 135–145)

## 2019-03-22 LAB — CBC WITH DIFFERENTIAL/PLATELET
Abs Immature Granulocytes: 0.07 10*3/uL (ref 0.00–0.07)
Basophils Absolute: 0 10*3/uL (ref 0.0–0.1)
Basophils Relative: 0 %
Eosinophils Absolute: 0 10*3/uL (ref 0.0–0.5)
Eosinophils Relative: 0 %
HCT: 40.8 % (ref 36.0–46.0)
Hemoglobin: 13.4 g/dL (ref 12.0–15.0)
Immature Granulocytes: 1 %
Lymphocytes Relative: 5 %
Lymphs Abs: 0.6 10*3/uL — ABNORMAL LOW (ref 0.7–4.0)
MCH: 33 pg (ref 26.0–34.0)
MCHC: 32.8 g/dL (ref 30.0–36.0)
MCV: 100.5 fL — ABNORMAL HIGH (ref 80.0–100.0)
Monocytes Absolute: 1 10*3/uL (ref 0.1–1.0)
Monocytes Relative: 9 %
Neutro Abs: 9.6 10*3/uL — ABNORMAL HIGH (ref 1.7–7.7)
Neutrophils Relative %: 85 %
Platelets: 298 10*3/uL (ref 150–400)
RBC: 4.06 MIL/uL (ref 3.87–5.11)
RDW: 14.1 % (ref 11.5–15.5)
WBC: 11.3 10*3/uL — ABNORMAL HIGH (ref 4.0–10.5)
nRBC: 0 % (ref 0.0–0.2)

## 2019-03-22 LAB — URINALYSIS, ROUTINE W REFLEX MICROSCOPIC
Bilirubin Urine: NEGATIVE
Glucose, UA: NEGATIVE mg/dL
Hgb urine dipstick: NEGATIVE
Ketones, ur: NEGATIVE mg/dL
Leukocytes,Ua: NEGATIVE
Nitrite: NEGATIVE
Protein, ur: NEGATIVE mg/dL
Specific Gravity, Urine: 1.016 (ref 1.005–1.030)
pH: 5 (ref 5.0–8.0)

## 2019-03-22 LAB — TROPONIN I (HIGH SENSITIVITY): Troponin I (High Sensitivity): 21 ng/L — ABNORMAL HIGH (ref <18)

## 2019-03-22 MED ORDER — SODIUM CHLORIDE 0.9 % IV BOLUS
500.0000 mL | Freq: Once | INTRAVENOUS | Status: AC
  Administered 2019-03-22: 500 mL via INTRAVENOUS

## 2019-03-22 NOTE — ED Notes (Signed)
GranddaughterRip Harbour [phone] for updates, says she is primary caregiver of the patient

## 2019-03-22 NOTE — ED Notes (Signed)
EDP made aware of pt status.

## 2019-03-22 NOTE — ED Notes (Signed)
Patient verbalizes understanding of discharge instructions. Opportunity for questioning and answers were provided. Armband removed by staff, pt discharged from ED through [facility] to [facility]

## 2019-03-22 NOTE — ED Provider Notes (Signed)
  Physical Exam  BP (!) 157/104   Pulse 89   Temp 97.6 F (36.4 C) (Oral)   Resp 16   SpO2 100%   Physical Exam  ED Course/Procedures   Clinical Course as of Mar 21 2318  Sat Mar 22, 2019  1543 Patient signed out to Dr Kathrynn Humble, pending workup   [MT]    Clinical Course User Index [MT] Langston Masker Carola Rhine, MD    Procedures  MDM    Assuming patient's care from Dr. Langston Masker. Patient sent here from [facility] with chief complaint of fall.  Allegedly patient has been having increased weakness and falls over the past few days.  She was admitted for COVID-19 to the hospital recently and discharged to [facility].  Prior to that she was living at her home. Appropriate scans have been ordered.  If the blood work and CT scan are negative then she can be discharged.  Reassessment: CT scans do not find any acute changes.  Labs are reassuring.  Urine is not showing any signs of infection. Creatinine is slightly elevated, we have given her finder cc fluid bolus.  There is no evidence of dehydration on labs otherwise and we suspect that the elevated creatinine is a result of COVID-19.  I called [place] [facility] on 3 separate occasions.  There was no response from the nursing team over there, therefore we are unable to establish patient's baseline.  Later inpatient stay I did speak with her granddaughter in law, who suggested that patient was quite active before acquiring COVID-19.  However she does admit that patient has been having increased fall over the last 2 months.  She suspects that patient is having falls because of weakness and dementia.  Neuro exam at the bedside is nonfocal.  There is no nystagmus and patient able to complete finger-to-nose without any difficulty.  Given the COVID-19 I had considered stroke in the differential, however it seems like her symptoms are subacute and likely related to deconditioning.  She is stable for discharge at this time.  I advised family to contact PCP  if patient's balance continues to deteriorate.   Varney Biles, MD 03/22/19 [phone]

## 2019-03-22 NOTE — ED Notes (Signed)
Pt returned from CT °

## 2019-03-22 NOTE — ED Provider Notes (Signed)
[facility] EMERGENCY DEPARTMENT Provider Note   CSN: [id number] Arrival date & time: 03/22/19  1420     History Chief Complaint  Patient presents with  . Fall    CLAREE MCINERNY is a 86 y.o. female w/ hx of A Fib on eliquis, dementia, presenting from [facility] with recurrent unwitnessed falls.  Had 2 unwitnessed falls today.  Patient at baseline mental state.  She tells me she fell because her legs were "weak."  No fevers or chills.  Recent hospitalization for COVID and hyponatremia in Jan 2021, discharged on 03/10/2019.  Patient is DNR.  HPI     Past Medical History:  Diagnosis Date  . Arthritis   . Breast cancer ([place])   . Chronic back pain   . GERD (gastroesophageal reflux disease)   . History of radiation therapy 08/10/17- 09/07/17   Left Breast 28.50 Gy in 5 fractions given on a weekly basis.   . Hypertension   . UTI (urinary tract infection)     Patient Active Problem List   Diagnosis Date Noted  . COVID-19 03/06/2019  . DNR (do not resuscitate) 03/06/2019  . Encephalopathy due to COVID-19 virus 03/05/2019  . Generalized weakness 03/05/2019  . COVID-19 virus infection 03/05/2019  . Permanent atrial fibrillation ([place]) 10/27/2018  . LVH (left ventricular hypertrophy) 10/27/2018  . 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, estrogen receptor positive ([place]) 05/23/2017    Past Surgical History:  Procedure Laterality Date  . ABDOMINAL HYSTERECTOMY     she still has one ovary in place.   Marland Kitchen BREAST LUMPECTOMY    . BREAST LUMPECTOMY WITH RADIOACTIVE SEED LOCALIZATION Left 06/22/2017   Procedure: BREAST LUMPECTOMY WITH RADIOACTIVE SEED LOCALIZATION;  Surgeon: [name], MD;  Location: [facility];  Service: General;  Laterality: Left;  . CHOLECYSTECTOMY    . HERNIA REPAIR       OB History   No obstetric history on file.     Family History  Problem Relation Age of Onset  . Stroke Mother 73    Social  History   Tobacco Use  . Smoking status: Never Smoker  . Smokeless tobacco: Never Used  Substance Use Topics  . Alcohol use: No  . Drug use: No    Home Medications Prior to Admission medications   Medication Sig Start Date End Date Taking? Authorizing Provider  acetaminophen (TYLENOL) 325 MG tablet Take 2 tablets (650 mg total) by mouth every 6 (six) hours as needed for mild pain (or Fever >/= 101). 03/10/19  Yes Elgergawy, Silver Huguenin, MD  amitriptyline (ELAVIL) 25 MG tablet Take 25 mg by mouth at bedtime.  04/11/17  Yes [provider]  apixaban (ELIQUIS) 2.5 MG TABS tablet Take 1 tablet (2.5 mg total) by mouth 2 (two) times daily. 03/10/19  Yes Elgergawy, Silver Huguenin, MD  ascorbic acid (VITAMIN C) 500 MG tablet Take 1,000 mg by mouth daily.   Yes [provider]  Cholecalciferol (VITAMIN D) 50 MCG (2000 UT) tablet Take 2,000 Units by mouth daily.   Yes [provider]  furosemide (LASIX) 20 MG tablet Take 1 tablet by mouth once daily Patient taking differently: Take 20 mg by mouth daily.  12/24/18  Yes Minus Breeding, MD  metoprolol succinate (TOPROL-XL) 50 MG 24 hr tablet Take 1 tablet by mouth once daily Patient taking differently: Take 50 mg by mouth daily.  12/24/18  Yes Minus Breeding, MD  pantoprazole (PROTONIX) 40 MG tablet  Take 40 mg by mouth daily. 03/03/19  Yes [provider]  senna (SENOKOT) 8.6 MG tablet Take 1 tablet by mouth 2 (two) times daily.    Yes [provider]  Zinc 50 MG TABS Take 50 mg by mouth daily.   Yes [provider]    Allergies    Codeine  Review of Systems   Review of Systems  Unable to perform ROS: Dementia (level 5 caveat)    Physical Exam Updated Vital Signs BP (!) 177/121 (BP Location: Right Arm)   Pulse (!) 115   Temp 97.7 F (36.5 C) (Axillary)   Resp 16   SpO2 92%   Physical Exam Vitals and nursing note reviewed.  Constitutional:      General: She is not in acute distress.     Appearance: She is well-developed.  HENT:     Head: Normocephalic and atraumatic.  Eyes:     Conjunctiva/sclera: Conjunctivae normal.  Neck:     Comments: C spine collar in place Cardiovascular:     Rate and Rhythm: Normal rate. Rhythm irregular.     Pulses: Normal pulses.  Pulmonary:     Effort: Pulmonary effort is normal. No respiratory distress.  Musculoskeletal:        General: No tenderness. Normal range of motion.     Comments: No hip pain or deformity  Skin:    General: Skin is warm and dry.  Neurological:     Mental Status: She is alert and oriented to person, place, and time.     Comments: Oriented x 2 (person, place)     ED Results / Procedures / Treatments   Labs (all labs ordered are listed, but only abnormal results are displayed) Labs Reviewed  BASIC METABOLIC PANEL  CBC WITH DIFFERENTIAL/PLATELET  URINALYSIS, ROUTINE W REFLEX MICROSCOPIC  TROPONIN I (HIGH SENSITIVITY)    EKG EKG Interpretation  Date/Time:  Saturday March 22 2019 14:35:30 EST Ventricular Rate:  110 PR Interval:    QRS Duration: 76 QT Interval:  306 QTC Calculation: 414 R Axis:   66 Text Interpretation: Atrial fibrillation Ventricular premature complex Probable anterior infarct, age indeterminate No STEMI, no significant changes Confirmed by Octaviano Glow [phone]) on 03/22/2019 3:01:39 PM   Radiology No results found.  Procedures Procedures (including critical care time)  Medications Ordered in ED Medications  sodium chloride 0.9 % bolus 500 mL (has no administration in time range)    ED Course  I have reviewed the triage vital signs and the nursing notes.  Pertinent labs & imaging results that were available during my care of the patient were reviewed by me and considered in my medical decision making (see chart for details).  86 yo female on eliquis presenting with recurrent falls.  Reporting her legs are weak and giving out on her.  Normal strength in lower extremities.   No evidence of head trauma, spinal injury, or hip fracture on exam  Plan for weakness evaluation Check electrolytes, hgb, trop, ECG Airborne precautions for recent Covid diagnosis CT head and C-spine given A/C status UA for infection w/u   Final Clinical Impression(s) / ED Diagnoses Final diagnoses:  Weakness  Fall, initial encounter    Rx / DC Orders ED Discharge Orders    None       Mickey Hebel, Carola Rhine, MD 03/22/19 1544

## 2019-03-22 NOTE — ED Triage Notes (Addendum)
Pt arrives with [facility] from [place] after 2 unwitnessed falls this morning. Pt has hx of dementia, CHF, afib, and was COVID + on 1/25/2. Pt is taking eliquis and is a&o to self.  EMS vitals:  160/106 HR 90-150 RR 16 CG 219

## 2019-03-22 NOTE — ED Notes (Signed)
Dr. Nanavati made aware of increased BP; no further orders at this time.  

## 2019-03-22 NOTE — ED Notes (Signed)
Called CT to confirm Scan; pt will be available for scan after a few other patients.

## 2019-03-22 NOTE — Discharge Instructions (Addendum)
We saw you in the ER after you had a fall. All the imaging results are normal, no fractures seen. No evidence of brain bleed. Please be very careful with walking, and do everything possible to prevent falls.  We recommend physical therapy to improve strength and to teach mechanics to reduce fall.

## 2019-03-22 NOTE — ED Notes (Signed)
Called [facility] for transport to [facility]

## 2019-10-14 ENCOUNTER — Encounter (HOSPITAL_COMMUNITY): Payer: Self-pay | Admitting: Internal Medicine

## 2019-10-14 ENCOUNTER — Emergency Department (HOSPITAL_COMMUNITY): Payer: Medicare Other

## 2019-10-14 ENCOUNTER — Observation Stay (HOSPITAL_COMMUNITY): Payer: Medicare Other

## 2019-10-14 ENCOUNTER — Inpatient Hospital Stay (HOSPITAL_COMMUNITY)
Admission: EM | Admit: 2019-10-14 | Discharge: 2019-10-17 | DRG: 871 | Disposition: A | Discharge status: Skilled Nursing Facility | Payer: Medicare Other | Source: Skilled Nursing Facility | Attending: Student | Admitting: Student

## 2019-10-14 ENCOUNTER — Other Ambulatory Visit: Payer: Self-pay

## 2019-10-14 DIAGNOSIS — G9341 Metabolic encephalopathy: Secondary | ICD-10-CM | POA: Diagnosis present

## 2019-10-14 DIAGNOSIS — Z8616 Personal history of COVID-19: Secondary | ICD-10-CM

## 2019-10-14 DIAGNOSIS — F0390 Unspecified dementia without behavioral disturbance: Secondary | ICD-10-CM | POA: Diagnosis present

## 2019-10-14 DIAGNOSIS — Z66 Do not resuscitate: Secondary | ICD-10-CM | POA: Diagnosis not present

## 2019-10-14 DIAGNOSIS — R6510 Systemic inflammatory response syndrome (SIRS) of non-infectious origin without acute organ dysfunction: Secondary | ICD-10-CM

## 2019-10-14 DIAGNOSIS — D649 Anemia, unspecified: Secondary | ICD-10-CM | POA: Diagnosis present

## 2019-10-14 DIAGNOSIS — E872 Acidosis, unspecified: Secondary | ICD-10-CM | POA: Diagnosis present

## 2019-10-14 DIAGNOSIS — Z20822 Contact with and (suspected) exposure to covid-19: Secondary | ICD-10-CM | POA: Diagnosis present

## 2019-10-14 DIAGNOSIS — R54 Age-related physical debility: Secondary | ICD-10-CM | POA: Diagnosis present

## 2019-10-14 DIAGNOSIS — K219 Gastro-esophageal reflux disease without esophagitis: Secondary | ICD-10-CM

## 2019-10-14 DIAGNOSIS — I509 Heart failure, unspecified: Secondary | ICD-10-CM | POA: Diagnosis present

## 2019-10-14 DIAGNOSIS — N3001 Acute cystitis with hematuria: Secondary | ICD-10-CM | POA: Diagnosis present

## 2019-10-14 DIAGNOSIS — Z853 Personal history of malignant neoplasm of breast: Secondary | ICD-10-CM

## 2019-10-14 DIAGNOSIS — E871 Hypo-osmolality and hyponatremia: Secondary | ICD-10-CM | POA: Diagnosis present

## 2019-10-14 DIAGNOSIS — N390 Urinary tract infection, site not specified: Secondary | ICD-10-CM | POA: Diagnosis not present

## 2019-10-14 DIAGNOSIS — R000 Tachycardia, unspecified: Secondary | ICD-10-CM | POA: Diagnosis present

## 2019-10-14 DIAGNOSIS — M549 Dorsalgia, unspecified: Secondary | ICD-10-CM | POA: Diagnosis present

## 2019-10-14 DIAGNOSIS — Z79899 Other long term (current) drug therapy: Secondary | ICD-10-CM

## 2019-10-14 DIAGNOSIS — A419 Sepsis, unspecified organism: Secondary | ICD-10-CM | POA: Diagnosis present

## 2019-10-14 DIAGNOSIS — Z823 Family history of stroke: Secondary | ICD-10-CM

## 2019-10-14 DIAGNOSIS — J9601 Acute respiratory failure with hypoxia: Secondary | ICD-10-CM | POA: Diagnosis present

## 2019-10-14 DIAGNOSIS — G8929 Other chronic pain: Secondary | ICD-10-CM | POA: Diagnosis present

## 2019-10-14 DIAGNOSIS — E860 Dehydration: Secondary | ICD-10-CM | POA: Diagnosis present

## 2019-10-14 DIAGNOSIS — Z7901 Long term (current) use of anticoagulants: Secondary | ICD-10-CM

## 2019-10-14 DIAGNOSIS — I10 Essential (primary) hypertension: Secondary | ICD-10-CM | POA: Diagnosis present

## 2019-10-14 DIAGNOSIS — Z923 Personal history of irradiation: Secondary | ICD-10-CM

## 2019-10-14 DIAGNOSIS — M1990 Unspecified osteoarthritis, unspecified site: Secondary | ICD-10-CM | POA: Diagnosis present

## 2019-10-14 DIAGNOSIS — I4821 Permanent atrial fibrillation: Secondary | ICD-10-CM | POA: Diagnosis present

## 2019-10-14 DIAGNOSIS — A412 Sepsis due to unspecified staphylococcus: Secondary | ICD-10-CM | POA: Diagnosis not present

## 2019-10-14 DIAGNOSIS — N3000 Acute cystitis without hematuria: Secondary | ICD-10-CM | POA: Diagnosis not present

## 2019-10-14 DIAGNOSIS — J189 Pneumonia, unspecified organism: Secondary | ICD-10-CM | POA: Diagnosis not present

## 2019-10-14 DIAGNOSIS — I110 Hypertensive heart disease with heart failure: Secondary | ICD-10-CM | POA: Diagnosis present

## 2019-10-14 DIAGNOSIS — Z885 Allergy status to narcotic agent status: Secondary | ICD-10-CM

## 2019-10-14 HISTORY — DX: Gastro-esophageal reflux disease without esophagitis: K21.9

## 2019-10-14 LAB — BRAIN NATRIURETIC PEPTIDE: B Natriuretic Peptide: 640.4 pg/mL — ABNORMAL HIGH (ref 0.0–100.0)

## 2019-10-14 LAB — URINALYSIS, ROUTINE W REFLEX MICROSCOPIC
Bilirubin Urine: NEGATIVE
Glucose, UA: NEGATIVE mg/dL
Ketones, ur: 5 mg/dL — AB
Nitrite: NEGATIVE
Protein, ur: NEGATIVE mg/dL
Specific Gravity, Urine: 1.019 (ref 1.005–1.030)
WBC, UA: >50 WBC/hpf — ABNORMAL HIGH (ref 0–5)
pH: 5 (ref 5.0–8.0)

## 2019-10-14 LAB — CBC WITH DIFFERENTIAL/PLATELET
Abs Immature Granulocytes: 0.13 10*3/uL — ABNORMAL HIGH (ref 0.00–0.07)
Basophils Absolute: 0 10*3/uL (ref 0.0–0.1)
Basophils Relative: 0 %
Eosinophils Absolute: 0 10*3/uL (ref 0.0–0.5)
Eosinophils Relative: 0 %
HCT: 40.2 % (ref 36.0–46.0)
Hemoglobin: 12.4 g/dL (ref 12.0–15.0)
Immature Granulocytes: 1 %
Lymphocytes Relative: 7 %
Lymphs Abs: 1 10*3/uL (ref 0.7–4.0)
MCH: 27.7 pg (ref 26.0–34.0)
MCHC: 30.8 g/dL (ref 30.0–36.0)
MCV: 89.7 fL (ref 80.0–100.0)
Monocytes Absolute: 1.1 10*3/uL — ABNORMAL HIGH (ref 0.1–1.0)
Monocytes Relative: 8 %
Neutro Abs: 11.5 10*3/uL — ABNORMAL HIGH (ref 1.7–7.7)
Neutrophils Relative %: 84 %
Platelets: 415 10*3/uL — ABNORMAL HIGH (ref 150–400)
RBC: 4.48 MIL/uL (ref 3.87–5.11)
RDW: 17.1 % — ABNORMAL HIGH (ref 11.5–15.5)
WBC: 13.8 10*3/uL — ABNORMAL HIGH (ref 4.0–10.5)
nRBC: 0 % (ref 0.0–0.2)

## 2019-10-14 LAB — BLOOD GAS, ARTERIAL
Acid-Base Excess: 3.5 mmol/L — ABNORMAL HIGH (ref 0.0–2.0)
Bicarbonate: 26.5 mmol/L (ref 20.0–28.0)
O2 Saturation: 98 %
Patient temperature: 37
pCO2 arterial: 35.3 mmHg (ref 32.0–48.0)
pH, Arterial: 7.488 — ABNORMAL HIGH (ref 7.350–7.450)
pO2, Arterial: 100 mmHg (ref 83.0–108.0)

## 2019-10-14 LAB — COMPREHENSIVE METABOLIC PANEL
ALT: 15 U/L (ref 0–44)
AST: 21 U/L (ref 15–41)
Albumin: 2.4 g/dL — ABNORMAL LOW (ref 3.5–5.0)
Alkaline Phosphatase: 88 U/L (ref 38–126)
Anion gap: 12 (ref 5–15)
BUN: 13 mg/dL (ref 8–23)
CO2: 25 mmol/L (ref 22–32)
Calcium: 8.8 mg/dL — ABNORMAL LOW (ref 8.9–10.3)
Chloride: 94 mmol/L — ABNORMAL LOW (ref 98–111)
Creatinine, Ser: 0.82 mg/dL (ref 0.44–1.00)
GFR calc Af Amer: >60 mL/min (ref >60)
GFR calc non Af Amer: 60 mL/min — ABNORMAL LOW (ref >60)
Glucose, Bld: 116 mg/dL — ABNORMAL HIGH (ref 70–99)
Potassium: 4.1 mmol/L (ref 3.5–5.1)
Sodium: 131 mmol/L — ABNORMAL LOW (ref 135–145)
Total Bilirubin: 0.6 mg/dL (ref 0.3–1.2)
Total Protein: 6.3 g/dL — ABNORMAL LOW (ref 6.5–8.1)

## 2019-10-14 LAB — LACTIC ACID, PLASMA
Lactic Acid, Venous: 4.1 mmol/L (ref 0.5–1.9)
Lactic Acid, Venous: 3.5 mmol/L (ref 0.5–1.9)

## 2019-10-14 LAB — C-REACTIVE PROTEIN: CRP: 6.8 mg/dL — ABNORMAL HIGH (ref <1.0)

## 2019-10-14 LAB — SARS CORONAVIRUS 2 BY RT PCR (HOSPITAL ORDER, PERFORMED IN ~~LOC~~ HOSPITAL LAB): SARS Coronavirus 2: NEGATIVE

## 2019-10-14 MED ORDER — PANTOPRAZOLE SODIUM 40 MG PO TBEC
40.0000 mg | DELAYED_RELEASE_TABLET | Freq: Every day | ORAL | Status: DC
  Administered 2019-10-15 – 2019-10-17 (×3): 40 mg via ORAL
  Filled 2019-10-14 (×3): qty 1

## 2019-10-14 MED ORDER — METOPROLOL SUCCINATE ER 50 MG PO TB24
50.0000 mg | ORAL_TABLET | Freq: Every day | ORAL | Status: DC
  Administered 2019-10-15 – 2019-10-17 (×3): 50 mg via ORAL
  Filled 2019-10-14 (×3): qty 1

## 2019-10-14 MED ORDER — IOHEXOL 300 MG/ML  SOLN
75.0000 mL | Freq: Once | INTRAMUSCULAR | Status: AC | PRN
  Administered 2019-10-14: 75 mL via INTRAVENOUS

## 2019-10-14 MED ORDER — ACETAMINOPHEN 325 MG PO TABS
650.0000 mg | ORAL_TABLET | Freq: Four times a day (QID) | ORAL | Status: DC | PRN
  Administered 2019-10-14: 650 mg via ORAL
  Filled 2019-10-14: qty 2

## 2019-10-14 MED ORDER — MELATONIN 5 MG PO TABS
5.0000 mg | ORAL_TABLET | Freq: Every day | ORAL | Status: DC
  Administered 2019-10-14 – 2019-10-16 (×3): 5 mg via ORAL
  Filled 2019-10-14 (×3): qty 1

## 2019-10-14 MED ORDER — ONDANSETRON HCL 4 MG/2ML IJ SOLN: 4.0000 mg | Freq: Four times a day (QID) | INTRAMUSCULAR | Status: DC | PRN

## 2019-10-14 MED ORDER — ONDANSETRON HCL 4 MG PO TABS: 4.0000 mg | ORAL_TABLET | Freq: Four times a day (QID) | ORAL | Status: DC | PRN

## 2019-10-14 MED ORDER — ACETAMINOPHEN 650 MG RE SUPP: 650.0000 mg | Freq: Four times a day (QID) | RECTAL | Status: DC | PRN

## 2019-10-14 MED ORDER — SODIUM CHLORIDE 0.9 % IV SOLN: INTRAVENOUS | Status: DC

## 2019-10-14 MED ORDER — ADULT MULTIVITAMIN W/MINERALS CH
1.0000 | ORAL_TABLET | Freq: Every day | ORAL | Status: DC
  Administered 2019-10-15 – 2019-10-17 (×3): 1 via ORAL
  Filled 2019-10-14 (×3): qty 1

## 2019-10-14 MED ORDER — APIXABAN 2.5 MG PO TABS
2.5000 mg | ORAL_TABLET | Freq: Two times a day (BID) | ORAL | Status: DC
  Administered 2019-10-14 – 2019-10-17 (×6): 2.5 mg via ORAL
  Filled 2019-10-14 (×7): qty 1

## 2019-10-14 MED ORDER — SODIUM CHLORIDE 0.9 % IV SOLN
1.0000 g | INTRAVENOUS | Status: DC
  Administered 2019-10-15: 1 g via INTRAVENOUS
  Filled 2019-10-14: qty 10

## 2019-10-14 MED ORDER — POLYETHYLENE GLYCOL 3350 17 G PO PACK: 17.0000 g | PACK | Freq: Every day | ORAL | Status: DC | PRN

## 2019-10-14 MED ORDER — SODIUM CHLORIDE 0.9 % IV SOLN
500.0000 mg | INTRAVENOUS | Status: DC
  Administered 2019-10-14 – 2019-10-15 (×2): 500 mg via INTRAVENOUS
  Filled 2019-10-14 (×2): qty 500

## 2019-10-14 NOTE — ED Triage Notes (Signed)
Pt arrived from [facility], dx with PNA yesterday, increased lethargy today, given 1G rocephin IM PTA.

## 2019-10-14 NOTE — ED Provider Notes (Addendum)
[facility] DEPT Provider Note   CSN: [id number] Arrival date & time: 10/14/19  1454     History Chief Complaint  Patient presents with  . Fatigue    [name] is a 86 y.o. female.  HPI    Patient with history dementia presents from nursing facility with staff concerns of possible pneumonia/sepsis.  The patient is slow to respond, but offers verbal responses to questions about her current situation.  Additional details about more recent timeframe, history, are unreliable secondary to her dementia, level 5 caveat secondary to this. Reported the patient was diagnosed with pneumonia yesterday and today had increased listlessness. Patient received 1 g of Rocephin IM by EMS providers in route. Patient self denies pain, states that she does not feel well, cannot specify why.  She cannot specify if she has any shortness of breath.  She seemingly denies focal weakness. Past Medical History:  Diagnosis Date  . Arthritis   . Breast cancer ([place])   . Chronic back pain   . GERD (gastroesophageal reflux disease)   . History of radiation therapy 08/10/17- 09/07/17   Left Breast 28.50 Gy in 5 fractions given on a weekly basis.   . Hypertension   . UTI (urinary tract infection)     Patient Active Problem List   Diagnosis Date Noted  . COVID-19 03/06/2019  . DNR (do not resuscitate) 03/06/2019  . Encephalopathy due to COVID-19 virus 03/05/2019  . Generalized weakness 03/05/2019  . COVID-19 virus infection 03/05/2019  . Permanent atrial fibrillation ([place]) 10/27/2018  . LVH (left ventricular hypertrophy) 10/27/2018  . 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 ABDOMINAL HYSTERECTOMY     she still has one ovary in place.   Marland Kitchen BREAST LUMPECTOMY    . BREAST LUMPECTOMY WITH RADIOACTIVE SEED LOCALIZATION Left 06/22/2017   Procedure:  BREAST LUMPECTOMY WITH RADIOACTIVE SEED LOCALIZATION;  Surgeon: [name], MD;  Location: [facility];  Service: General;  Laterality: Left;  . CHOLECYSTECTOMY    . HERNIA REPAIR       OB History   No obstetric history on file.     Family History  Problem Relation Age of Onset  . Stroke Mother 76    Social History   Tobacco Use  . Smoking status: Never Smoker  . Smokeless tobacco: Never Used  Vaping Use  . Vaping Use: Never used  Substance Use Topics  . Alcohol use: No  . Drug use: No    Home Medications Prior to Admission medications   Medication Sig Start Date End Date Taking? Authorizing Provider  acetaminophen (TYLENOL) 325 MG tablet Take 2 tablets (650 mg total) by mouth every 6 (six) hours as needed for mild pain (or Fever >/= 101). 03/10/19   Elgergawy, Silver Huguenin, MD  amitriptyline (ELAVIL) 25 MG tablet Take 25 mg by mouth at bedtime.  04/11/17   [provider]  apixaban (ELIQUIS) 2.5 MG TABS tablet Take 1 tablet (2.5 mg total) by mouth 2 (two) times daily. 03/10/19   Elgergawy, Silver Huguenin, MD  ascorbic acid (VITAMIN C) 500 MG tablet Take 1,000 mg by mouth daily.    [provider]  Cholecalciferol (VITAMIN D) 50 MCG (2000 UT) tablet Take 2,000 Units by mouth daily.    [provider]  furosemide (LASIX) 20 MG tablet Take 1 tablet by mouth once daily Patient  taking differently: Take 20 mg by mouth daily.  12/24/18   Minus Breeding, MD  metoprolol succinate (TOPROL-XL) 50 MG 24 hr tablet Take 1 tablet by mouth once daily Patient taking differently: Take 50 mg by mouth daily.  12/24/18   Minus Breeding, MD  pantoprazole (PROTONIX) 40 MG tablet Take 40 mg by mouth daily. 03/03/19   [provider]  senna (SENOKOT) 8.6 MG tablet Take 1 tablet by mouth 2 (two) times daily.     [provider]  Zinc 50 MG TABS Take 50 mg by mouth daily.    [provider]    Allergies    Codeine  Review of Systems     Review of Systems  Unable to perform ROS: Dementia    Physical Exam Updated Vital Signs BP (!) 133/101   Pulse 90   Temp 98.6 F (37 C) (Oral)   Resp (!) 22   SpO2 (!) 85%   Physical Exam Vitals and nursing note reviewed.  Constitutional:      Appearance: She is well-developed. She is ill-appearing.     Comments: Withdrawn, quiet sickly appearing elderly female  HENT:     Head: Normocephalic and atraumatic.  Eyes:     Conjunctiva/sclera: Conjunctivae normal.  Cardiovascular:     Rate and Rhythm: Normal rate and regular rhythm.  Pulmonary:     Effort: Pulmonary effort is normal. No respiratory distress.     Breath sounds: No stridor.  Abdominal:     General: There is no distension.  Skin:    General: Skin is warm and dry.  Neurological:     Cranial Nerves: No cranial nerve deficit.     Motor: Atrophy present.     Comments: Patient has globally decreased strength, but symmetrically, can move her upper and lower extremities, follows commands.  Face is symmetric, speech is slow, but clear.  Psychiatric:        Cognition and Memory: Cognition is impaired. Memory is impaired.      ED Results / Procedures / Treatments   Labs (all labs ordered are listed, but only abnormal results are displayed) Labs Reviewed  COMPREHENSIVE METABOLIC PANEL - Abnormal; Notable for the following components:      Result Value   Sodium 131 (*)    Chloride 94 (*)    Glucose, Bld 116 (*)    Calcium 8.8 (*)    Total Protein 6.3 (*)    Albumin 2.4 (*)    GFR calc non Af Amer 60 (*)    All other components within normal limits  LACTIC ACID, PLASMA - Abnormal; Notable for the following components:   Lactic Acid, Venous 4.1 (*)    All other components within normal limits  LACTIC ACID, PLASMA - Abnormal; Notable for the following components:   Lactic Acid, Venous 3.5 (*)    All other components within normal limits  CBC WITH DIFFERENTIAL/PLATELET - Abnormal; Notable for the following  components:   WBC 13.8 (*)    RDW 17.1 (*)    Platelets 415 (*)    Neutro Abs 11.5 (*)    Monocytes Absolute 1.1 (*)    Abs Immature Granulocytes 0.13 (*)    All other components within normal limits  URINALYSIS, ROUTINE W REFLEX MICROSCOPIC - Abnormal; Notable for the following components:   Color, Urine AMBER (*)    APPearance HAZY (*)    Hgb urine dipstick SMALL (*)    Ketones, ur 5 (*)    Leukocytes,Ua LARGE (*)  WBC, UA >50 (*)    Bacteria, UA MANY (*)    All other components within normal limits  SARS CORONAVIRUS 2 BY RT PCR (HOSPITAL ORDER, [facility])  BRAIN NATRIURETIC PEPTIDE    EKG None  Radiology DG Chest Port 1 View  Result Date: 10/14/2019 CLINICAL DATA:  Lethargy and cough. EXAM: PORTABLE CHEST 1 VIEW COMPARISON:  March 05, 2019 FINDINGS: Mildly decreased lung volumes are seen which is likely, in part, secondary to the degree of patient inspiration. Mild, diffuse chronic appearing increased lung markings are seen with very mild atelectasis noted within the bilateral lung bases. There is a small left pleural effusion. No pneumothorax is identified. The heart size and mediastinal contours are within normal limits. There is moderate severity calcification of the aortic arch. Moderate severity levoscoliosis of the lower thoracic spine is noted. IMPRESSION: 1. Mild, diffuse chronic appearing increased lung markings with very mild bibasilar atelectasis. 2. Small left pleural effusion. Electronically Signed   By: Virgina Norfolk M.D.   On: 10/14/2019 16:18    Procedures Procedures (including critical care time)  Medications Ordered in ED Medications  0.9 %  sodium chloride infusion (has no administration in time range)    ED Course  I have reviewed the triage vital signs and the nursing notes.  Pertinent labs & imaging results that were available during my care of the patient were reviewed by me and considered in my medical decision  making (see chart for details).  Update: After initial fluid resuscitation, patient is awake, alert slightly more so than on arrival. She remains normotensive, though slightly tachycardic. Labs thus far notable for evidence for urinary tract infection, leukocytosis. X-ray does not demonstrate obvious pneumonia.  Line she is already received antibiotics, prior to ED arrival.   7:02 PM Following 1 L fluid resuscitation lactic is gone from 4.1 now 3.5.  Patient has no hypotension. With history of heart failure, on Lasix, and demonstration of pleural effusion, additional fluids will be provided judiciously, though the patient does meet criteria for systemic inflammatory response syndrome/concern for sepsis. Fluid resuscitation is started, antibiotics been provided, patient is appropriate for internal medicine admission.   I spoke with Roderic Scarce [phone]), her daughter.  We confirmed goals of care, and discussed the patient's care / admission / treatment.   Final Clinical Impression(s) / ED Diagnoses Final diagnoses:  SIRS (systemic inflammatory response syndrome) ([place])  Lower urinary tract infectious disease   MDM Number of Diagnoses or Management Options Lower urinary tract infectious disease: new, needed workup SIRS (systemic inflammatory response syndrome) ([place]): new, needed workup   Amount and/or Complexity of Data Reviewed Clinical lab tests: reviewed Tests in the radiology section of CPT: reviewed Tests in the medicine section of CPT: reviewed Decide to obtain previous medical records or to obtain history from someone other than the patient: yes Obtain history from someone other than the patient: yes Review and summarize past medical records: yes Discuss the patient with other providers: yes Independent visualization of images, tracings, or specimens: yes  Risk of Complications, Morbidity, and/or Mortality Presenting problems: high Diagnostic procedures:  high Management options: high  Critical Care Total time providing critical care: (35)  Patient Progress Patient progress: stable    Carmin Muskrat, MD 10/14/19 1904    Carmin Muskrat, MD 10/14/19 Artist Pais    Carmin Muskrat, MD 10/14/19 1939

## 2019-10-14 NOTE — ED Notes (Signed)
Attempted to get blood. Patient is a very hard stick. Putting in IV team consult.

## 2019-10-14 NOTE — ED Notes (Signed)
Pt placed on purewick at 50 mmHg. 

## 2019-10-14 NOTE — H&P (Signed)
History and Physical    [name] RKY:[id number] DOB: 1920/07/14 DOA: 10/14/2019  PCP: Lawerance Cruel, MD  Patient coming from: [facility]   Chief Complaint:  Chief Complaint  Patient presents with  . Fatigue     HPI:   86 year old female with PMHx permanent atrial fibrillation (on Eliquis), GERD, HTN, LVH, Dementia and remote Hx of breast cancer who presents to [facility] ED from [facility] due to weakness, lethargy and concern for pneumonia.  Patient is an extremely poor historian due to dementia and the majority of the history has been obtained from review of facility notes and discussion with emergency department staff.  Patient has been exhibiting increasing lethargy and confusion over the past several days beyond her baseline.  Patient underwent a workup at the facility and was diagnosed with pneumonia.  EMS was eventually called who adminitered 1 gram of Ceftriaxone en route.    Upon evaluation in the emergency department, patient was found to have a lactic acidosis of 4.1, UA suggestive of UTI, leukocytosis of 13.8  Additionally, patient was found to have patchy infiltrates on chest x-ray.  Patient was initiated on IVF by the ER provider and the hospitalist group was called to assess the patient for admission to the hospital.    Review of Systems: Unable to perform due to advanced dementia.    Past Medical History:  Diagnosis Date  . Arthritis   . Breast cancer ([place])   . Chronic back pain   . COVID-19 virus infection 03/05/2019  . GERD (gastroesophageal reflux disease)   . GERD without esophagitis 10/14/2019  . History of radiation therapy 08/10/17- 09/07/17   Left Breast 28.50 Gy in 5 fractions given on a weekly basis.   . Hypertension   . UTI (urinary tract infection)     Past Surgical History:  Procedure Laterality Date  . ABDOMINAL HYSTERECTOMY     she still has one ovary in place.   Marland Kitchen BREAST LUMPECTOMY    . BREAST LUMPECTOMY WITH  RADIOACTIVE SEED LOCALIZATION Left 06/22/2017   Procedure: BREAST LUMPECTOMY WITH RADIOACTIVE SEED LOCALIZATION;  Surgeon: [name], MD;  Location: [facility];  Service: General;  Laterality: Left;  . CHOLECYSTECTOMY    . HERNIA REPAIR       reports that she has never smoked. She has never used smokeless tobacco. She reports that she does not drink alcohol and does not use drugs.  Allergies  Allergen Reactions  . Codeine Nausea And Vomiting    Family History  Problem Relation Age of Onset  . Stroke Mother 10     Prior to Admission medications   Medication Sig Start Date End Date Taking? Authorizing Provider  apixaban (ELIQUIS) 2.5 MG TABS tablet Take 1 tablet (2.5 mg total) by mouth 2 (two) times daily. 03/10/19  Yes Elgergawy, Silver Huguenin, MD  furosemide (LASIX) 20 MG tablet Take 1 tablet by mouth once daily Patient taking differently: Take 10 mg by mouth every other day.  12/24/18  Yes Minus Breeding, MD  melatonin 5 MG TABS Take 5 mg by mouth at bedtime.   Yes [provider]  metoprolol succinate (TOPROL-XL) 50 MG 24 hr tablet Take 1 tablet by mouth once daily Patient taking differently: Take 50 mg by mouth daily.  12/24/18  Yes Minus Breeding, MD  Multiple Vitamins-Minerals (MULTI COMPLETE/IRON) TABS Take 1 tablet by mouth daily.   Yes [provider]  omeprazole (PRILOSEC) 20 MG  capsule Take 20 mg by mouth daily.   Yes [provider]  senna (SENOKOT) 8.6 MG tablet Take 1 tablet by mouth 2 (two) times daily.    Yes [provider]  acetaminophen (TYLENOL) 325 MG tablet Take 2 tablets (650 mg total) by mouth every 6 (six) hours as needed for mild pain (or Fever >/= 101). 03/10/19   Elgergawy, Silver Huguenin, MD    Physical Exam: Vitals:   10/14/19 1630 10/14/19 1634 10/14/19 1854 10/14/19 1958  BP: 112/72  (!) 133/101 (!) 127/99  Pulse: 88  90 94  Resp: (!) 25  (!) 22 (!) 29  Temp:  98.6 F (37 C)    TempSrc:  Oral    SpO2:  98%  (!) 85% 94%    Constitutional: Lethargic but arousablet and oriented x 1, no associated distress.   Skin: no rashes, no lesions, Poor skin turgor noted. Eyes: Pupils are equally reactive to light.  No evidence of scleral icterus or conjunctival pallor.  ENMT: Drymucous membranes noted.  Poor dentition.  Posterior pharynx clear of any exudate or lesions.   Neck: normal, supple, no masses, no thyromegaly.  No evidence of jugular venous distension.   Respiratory: Extensive rales noted throughout the left lung fields in addition to the right base.  No evidence of wheezing.  Normal respiratory effort. No accessory muscle use.  Cardiovascular: Regular rate and rhythm, no murmurs / rubs / gallops. No extremity edema. 2+ pedal pulses. No carotid bruits.  Chest:   Nontender without crepitus or deformity.   Back:   Nontender without crepitus or deformity. Abdomen: Abdomen is soft and nontender.  No evidence of intra-abdominal masses.  Positive bowel sounds noted in all quadrants.   Musculoskeletal:  Notable tenderness of the distal bilateral lower extremities with some associated mild peripheral edema.  No joint deformity upper and lower extremities. Good ROM, no contractures. Poor muscle tone. Neurologic: CN 2-12 grossly intact. Sensation intact, Patient is moving all 4 extremities.  Patient is following all commands.  Patient is responsive to verbal stimuli.   Psychiatric: Patient presents as a depressed mood with flat affect.  Patient currently doesn't seem to possess insight as to her current situation.    Labs on Admission: I have personally reviewed following labs and imaging studies -   CBC: Recent Labs  Lab 10/14/19 1545  WBC 13.8*  NEUTROABS 11.5*  HGB 12.4  HCT 40.2  MCV 89.7  PLT 993*   Basic Metabolic Panel: Recent Labs  Lab 10/14/19 1545  NA 131*  K 4.1  CL 94*  CO2 25  GLUCOSE 116*  BUN 13  CREATININE 0.82  CALCIUM 8.8*   GFR: CrCl cannot be calculated (Unknown  ideal weight.). Liver Function Tests: Recent Labs  Lab 10/14/19 1545  AST 21  ALT 15  ALKPHOS 88  BILITOT 0.6  PROT 6.3*  ALBUMIN 2.4*   No results for input(s): LIPASE, AMYLASE in the last 168 hours. No results for input(s): AMMONIA in the last 168 hours. Coagulation Profile: No results for input(s): INR, PROTIME in the last 168 hours. Cardiac Enzymes: No results for input(s): CKTOTAL, CKMB, CKMBINDEX, TROPONINI in the last 168 hours. BNP (last 3 results) No results for input(s): PROBNP in the last 8760 hours. HbA1C: No results for input(s): HGBA1C in the last 72 hours. CBG: No results for input(s): GLUCAP in the last 168 hours. Lipid Profile: No results for input(s): CHOL, HDL, LDLCALC, TRIG, CHOLHDL, LDLDIRECT in the last 72 hours. Thyroid  Function Tests: No results for input(s): TSH, T4TOTAL, FREET4, T3FREE, THYROIDAB in the last 72 hours. Anemia Panel: No results for input(s): VITAMINB12, FOLATE, FERRITIN, TIBC, IRON, RETICCTPCT in the last 72 hours. Urine analysis:    Component Value Date/Time   COLORURINE AMBER (A) 10/14/2019 1657   APPEARANCEUR HAZY (A) 10/14/2019 1657   LABSPEC 1.019 10/14/2019 1657   PHURINE 5.0 10/14/2019 1657   GLUCOSEU NEGATIVE 10/14/2019 1657   HGBUR SMALL (A) 10/14/2019 1657   BILIRUBINUR NEGATIVE 10/14/2019 1657   KETONESUR 5 (A) 10/14/2019 1657   PROTEINUR NEGATIVE 10/14/2019 1657   UROBILINOGEN 0.2 12/09/2013 2007   NITRITE NEGATIVE 10/14/2019 1657   LEUKOCYTESUR LARGE (A) 10/14/2019 1657    Radiological Exams on Admission - Personally Reviewed: DG Chest Port 1 View  Result Date: 10/14/2019 CLINICAL DATA:  Lethargy and cough. EXAM: PORTABLE CHEST 1 VIEW COMPARISON:  March 05, 2019 FINDINGS: Mildly decreased lung volumes are seen which is likely, in part, secondary to the degree of patient inspiration. Mild, diffuse chronic appearing increased lung markings are seen with very mild atelectasis noted within the bilateral lung bases.  There is a small left pleural effusion. No pneumothorax is identified. The heart size and mediastinal contours are within normal limits. There is moderate severity calcification of the aortic arch. Moderate severity levoscoliosis of the lower thoracic spine is noted. IMPRESSION: 1. Mild, diffuse chronic appearing increased lung markings with very mild bibasilar atelectasis. 2. Small left pleural effusion. Electronically Signed   By: Virgina Norfolk M.D.   On: 10/14/2019 16:18    Telemetry: Personally reviewed.  Rhythm is Atrial fibrillation at 98BPM.    Assessment/Plan Principal Problem:   Pneumonia of both lower lobes due to infectious organism   Patient presenting with several days of increasing lethargy beyond her baseline  CXR revealing patchy bilateral infiltrates - Radiology comments these look "chronic" however they were not present during her last hospitalization here earlier in the year.    Additionally patient is exhibiting a leukocytosis and lactic acidosis.    Treating the patient for suspected pneumonia with Ceftriaxone and Azithromycin  Covid 19 pending.  Of note, patient suffered from covid in January of this year prompting hospitalization.  Gentle IV hydration   Blood cultures ordered  Patient is exhibiting transient hypoxia with poor waveform in the ED, will obtain room air abg to confirm hypoxemia.   Active Problems:   Lactic acidosis   Lactic acidosis likely secondary to infection with concurrent volume depletion  Hydrate patient intravenous isotonic fluids while concurrently treating underlying infection  Monitoring serial lactic acid levels to ensure downtrending and resolution.    Acute cystitis without hematuria   UA reveals > 50 WBC with many bacteria and large leukocyte esterase however nitrite negative  Possible clinical infection due to lethargy/confusion  Organisms should be covered by current antibiotic regimen    Permanent atrial  fibrillation (HCC)   Currently rate controlled.  Continue home regimen of anticoagulation  Continue home regimen of rate controlling agent  Monitoring on telemetry    DNR (do not resuscitate)   DNR and MOST forms available  DNR order placed    Essential hypertension  Resume patients home regimen or oral antihypertensives Titrate antihypertensive regimen as necessary to achieve adequate BP control PRN intravenous antihypertensives for excessively elevated blood pressure    GERD without esophagitis .  Continuing home regimen of daily PPI therapy.    Code Status:  DNR Family Communication: Spoke to daughter via phone conversation who has  been updated on plan of care.   Status is: Observation  The patient remains OBS appropriate and will d/c before 2 midnights.  Dispo: The patient is from: SNF              Anticipated d/c is to: SNF              Anticipated d/c date is: 2 days              Patient currently is not medically stable to d/c.        Vernelle Emerald MD [facility] Pager [phone]  If 7PM-7AM, please contact night-coverage [email or web address] Use universal [place] password for that web site. If you do not have the password, please call the hospital operator.  10/14/2019, 8:24 PM

## 2019-10-15 DIAGNOSIS — R6520 Severe sepsis without septic shock: Secondary | ICD-10-CM

## 2019-10-15 DIAGNOSIS — R6521 Severe sepsis with septic shock: Secondary | ICD-10-CM

## 2019-10-15 DIAGNOSIS — R54 Age-related physical debility: Secondary | ICD-10-CM | POA: Diagnosis present

## 2019-10-15 DIAGNOSIS — Z7901 Long term (current) use of anticoagulants: Secondary | ICD-10-CM | POA: Diagnosis not present

## 2019-10-15 DIAGNOSIS — I10 Essential (primary) hypertension: Secondary | ICD-10-CM | POA: Diagnosis not present

## 2019-10-15 DIAGNOSIS — Z20822 Contact with and (suspected) exposure to covid-19: Secondary | ICD-10-CM | POA: Diagnosis present

## 2019-10-15 DIAGNOSIS — I4821 Permanent atrial fibrillation: Secondary | ICD-10-CM | POA: Diagnosis present

## 2019-10-15 DIAGNOSIS — G9341 Metabolic encephalopathy: Secondary | ICD-10-CM | POA: Diagnosis present

## 2019-10-15 DIAGNOSIS — M549 Dorsalgia, unspecified: Secondary | ICD-10-CM | POA: Diagnosis present

## 2019-10-15 DIAGNOSIS — A412 Sepsis due to unspecified staphylococcus: Secondary | ICD-10-CM | POA: Diagnosis present

## 2019-10-15 DIAGNOSIS — J189 Pneumonia, unspecified organism: Secondary | ICD-10-CM | POA: Diagnosis present

## 2019-10-15 DIAGNOSIS — E871 Hypo-osmolality and hyponatremia: Secondary | ICD-10-CM | POA: Diagnosis present

## 2019-10-15 DIAGNOSIS — N3000 Acute cystitis without hematuria: Secondary | ICD-10-CM | POA: Diagnosis not present

## 2019-10-15 DIAGNOSIS — Z8616 Personal history of COVID-19: Secondary | ICD-10-CM | POA: Diagnosis not present

## 2019-10-15 DIAGNOSIS — F0390 Unspecified dementia without behavioral disturbance: Secondary | ICD-10-CM | POA: Diagnosis present

## 2019-10-15 DIAGNOSIS — E872 Acidosis: Secondary | ICD-10-CM | POA: Diagnosis present

## 2019-10-15 DIAGNOSIS — Z885 Allergy status to narcotic agent status: Secondary | ICD-10-CM | POA: Diagnosis not present

## 2019-10-15 DIAGNOSIS — Z923 Personal history of irradiation: Secondary | ICD-10-CM | POA: Diagnosis not present

## 2019-10-15 DIAGNOSIS — M1990 Unspecified osteoarthritis, unspecified site: Secondary | ICD-10-CM | POA: Diagnosis present

## 2019-10-15 DIAGNOSIS — E860 Dehydration: Secondary | ICD-10-CM | POA: Diagnosis present

## 2019-10-15 DIAGNOSIS — A419 Sepsis, unspecified organism: Secondary | ICD-10-CM | POA: Diagnosis not present

## 2019-10-15 DIAGNOSIS — D649 Anemia, unspecified: Secondary | ICD-10-CM | POA: Diagnosis present

## 2019-10-15 DIAGNOSIS — Z853 Personal history of malignant neoplasm of breast: Secondary | ICD-10-CM | POA: Diagnosis not present

## 2019-10-15 DIAGNOSIS — R000 Tachycardia, unspecified: Secondary | ICD-10-CM | POA: Diagnosis present

## 2019-10-15 DIAGNOSIS — K219 Gastro-esophageal reflux disease without esophagitis: Secondary | ICD-10-CM | POA: Diagnosis present

## 2019-10-15 DIAGNOSIS — N390 Urinary tract infection, site not specified: Secondary | ICD-10-CM | POA: Diagnosis present

## 2019-10-15 DIAGNOSIS — I110 Hypertensive heart disease with heart failure: Secondary | ICD-10-CM | POA: Diagnosis present

## 2019-10-15 DIAGNOSIS — Z66 Do not resuscitate: Secondary | ICD-10-CM | POA: Diagnosis present

## 2019-10-15 DIAGNOSIS — G8929 Other chronic pain: Secondary | ICD-10-CM | POA: Diagnosis present

## 2019-10-15 DIAGNOSIS — N3001 Acute cystitis with hematuria: Secondary | ICD-10-CM | POA: Diagnosis present

## 2019-10-15 LAB — CBC WITH DIFFERENTIAL/PLATELET
Abs Immature Granulocytes: 0.09 10*3/uL — ABNORMAL HIGH (ref 0.00–0.07)
Basophils Absolute: 0 10*3/uL (ref 0.0–0.1)
Basophils Relative: 0 %
Eosinophils Absolute: 0.2 10*3/uL (ref 0.0–0.5)
Eosinophils Relative: 2 %
HCT: 40.1 % (ref 36.0–46.0)
Hemoglobin: 12.2 g/dL (ref 12.0–15.0)
Immature Granulocytes: 1 %
Lymphocytes Relative: 18 %
Lymphs Abs: 2.2 10*3/uL (ref 0.7–4.0)
MCH: 27.5 pg (ref 26.0–34.0)
MCHC: 30.4 g/dL (ref 30.0–36.0)
MCV: 90.5 fL (ref 80.0–100.0)
Monocytes Absolute: 0.8 10*3/uL (ref 0.1–1.0)
Monocytes Relative: 6 %
Neutro Abs: 9 10*3/uL — ABNORMAL HIGH (ref 1.7–7.7)
Neutrophils Relative %: 73 %
Platelets: 549 10*3/uL — ABNORMAL HIGH (ref 150–400)
RBC: 4.43 MIL/uL (ref 3.87–5.11)
RDW: 17.3 % — ABNORMAL HIGH (ref 11.5–15.5)
WBC: 12.3 10*3/uL — ABNORMAL HIGH (ref 4.0–10.5)
nRBC: 0 % (ref 0.0–0.2)

## 2019-10-15 LAB — STREP PNEUMONIAE URINARY ANTIGEN: Strep Pneumo Urinary Antigen: NEGATIVE

## 2019-10-15 LAB — URINE CULTURE: Culture: NO GROWTH

## 2019-10-15 LAB — LACTIC ACID, PLASMA: Lactic Acid, Venous: 2.3 mmol/L (ref 0.5–1.9)

## 2019-10-15 MED ORDER — SODIUM CHLORIDE 0.9% FLUSH: 10.0000 mL | INTRAVENOUS | Status: DC | PRN

## 2019-10-15 MED ORDER — SODIUM CHLORIDE 0.9% FLUSH
10.0000 mL | Freq: Two times a day (BID) | INTRAVENOUS | Status: DC
  Administered 2019-10-15 – 2019-10-16 (×2): 10 mL

## 2019-10-15 MED ORDER — AMLODIPINE BESYLATE 5 MG PO TABS
5.0000 mg | ORAL_TABLET | Freq: Every day | ORAL | Status: DC
  Administered 2019-10-15 – 2019-10-17 (×3): 5 mg via ORAL
  Filled 2019-10-15 (×3): qty 1

## 2019-10-15 NOTE — Progress Notes (Signed)
PROGRESS NOTE  [name]:[id number] DOB: Mar 20, 1920   PCP: Lawerance Cruel, MD  Patient is from: [name]  DOA: 10/14/2019 LOS: 0  Brief Narrative / Interim history: 86 year old female with PMH of permanent A. fib on Eliquis, dementia, HTN, GERD and remote history of breast cancer who was brought to ED due to weakness, lethargy and concern for pneumonia.  In ED, met criteria for severe sepsis/septic shock with tachycardia, tachypnea, leukocytosis and lactic acidosis to 4.1.  Source of infection to include multifocal pneumonia and UTI.  Cultures obtained.  She was started on ceftriaxone and azithromycin and admitted.  Subjective: Seen and examined earlier this morning.  No major events overnight of this morning.  No complaints but not a great historian.  She is only oriented to self and place.  She responds no to pain, shortness of breath, GI or UTI symptoms.   Objective: Vitals:   10/15/19 1100 10/15/19 1300 10/15/19 1315 10/15/19 1548  BP: (!) 139/116 124/86  (!) 98/56  Pulse: 81 98  83  Resp: 20 (!) 25  (!) 21  Temp:      TempSrc:      SpO2: 100% 97% 97% 100%    Intake/Output Summary (Last 24 hours) at 10/15/2019 1607 Last data filed at 10/15/2019 1102 Gross per 24 hour  Intake 100 ml  Output --  Net 100 ml   There were no vitals filed for this visit.  Examination:  GENERAL: Appears frail.  Nontoxic. HEENT: MMM.  Vision and hearing grossly intact.  NECK: Supple.  No apparent JVD.  RESP: 100% on 1 L.  No IWOB.  Fair aeration bilaterally. CVS:  RRR. Heart sounds normal.  ABD/GI/GU: BS+. Abd soft, NTND.  No CVA tenderness. MSK/EXT:  Moves extremities. No apparent deformity. No edema.  SKIN: no apparent skin lesion or wound NEURO: Sleepy but wakes to voice.  Oriented to self and place.  Follows commands.  No apparent focal neuro deficit. PSYCH: Calm. Normal affect.   Procedures:  None  Microbiology summarized: COVID-19 PCR negative. Blood  cultures NGTD. Urine cultures pending.  Assessment & Plan: Severe sepsis/septic shock-meet criteria on admission with tachycardia, tachypnea, leukocytosis, significant lactic acidosis to 4.1 and encephalopathy.  Source of infection include multifocal pneumonia and UTI. -Continue IV ceftriaxone and azithromycin -Follow cultures -Supportive care with incentive spirometry, mucolytic's and antitussive. -Wean oxygen as able.  Multifocal pneumonia: CT chest with multifocal patchy rounded area opacities most notably within LUL. -Antibiotics as above -Repeat two-view chest x-ray in 3 to 4 weeks. -Supportive care as above  Acute cystitis with hematuria: Patient denies UTI symptoms but not a reliable historian. -Continue antibiotics as above -Follow urine cultures  Acute metabolic encephalopathy in patient with history of dementia: Likely due to infectious process and dehydration. -Treat infectious process as above -IV fluids  Permanent atrial fibrillation: Rate controlled. -Continue home regimen-metoprolol and Eliquis  Essential hypertension: BP slightly elevated. -Decrease IV fluid. -Continue home metoprolol.  Lactic acidosis: Likely due to sepsis.  Downtrending. -Continue monitoring -Continue gentle IV fluid   GOC/DNR/DNI: Appropriate.  Debility/physical deconditioning -PT/OT eval.    There is no height or weight on file to calculate BMI.         DVT prophylaxis:  apixaban (ELIQUIS) tablet 2.5 mg Start: 10/14/19 2200 apixaban (ELIQUIS) tablet 2.5 mg  Code Status: DNR/DNI Family Communication: Updated patient's daughter over the phone. Status is: Inpatient  Remains inpatient appropriate because:IV treatments appropriate due to intensity of illness or inability  to take PO, Inpatient level of care appropriate due to severity of illness and Patient with severe sepsis.  Currently on IV antibiotics and IV fluid   Dispo: The patient is from: SNF              Anticipated  d/c is to: SNF              Anticipated d/c date is: 3 days              Patient currently is not medically stable to d/c.       Consultants:  None   Sch Meds:  Scheduled Meds: . amLODipine  5 mg Oral Daily  . apixaban  2.5 mg Oral BID  . melatonin  5 mg Oral QHS  . metoprolol succinate  50 mg Oral Daily  . multivitamin with minerals  1 tablet Oral Daily  . pantoprazole  40 mg Oral Daily  . sodium chloride flush  10-40 mL Intracatheter Q12H   Continuous Infusions: . sodium chloride 75 mL/hr at 10/15/19 1014  . azithromycin Stopped (10/15/19 0000)  . cefTRIAXone (ROCEPHIN)  IV Stopped (10/15/19 1102)   PRN Meds:.acetaminophen **OR** acetaminophen, ondansetron **OR** ondansetron (ZOFRAN) IV, polyethylene glycol, sodium chloride flush  Antimicrobials: Anti-infectives (From admission, onward)   Start     Dose/Rate Route Frequency Ordered Stop   10/15/19 1200  cefTRIAXone (ROCEPHIN) 1 g in sodium chloride 0.9 % 100 mL IVPB        1 g 200 mL/hr over 30 Minutes Intravenous Every 24 hours 10/14/19 1938 10/20/19 1159   10/14/19 1945  azithromycin (ZITHROMAX) 500 mg in sodium chloride 0.9 % 250 mL IVPB        500 mg 250 mL/hr over 60 Minutes Intravenous Every 24 hours 10/14/19 1938 10/19/19 1944       I have personally reviewed the following labs and images: CBC: Recent Labs  Lab 10/14/19 1545 10/15/19 0630  WBC 13.8* 12.3*  NEUTROABS 11.5* 9.0*  HGB 12.4 12.2  HCT 40.2 40.1  MCV 89.7 90.5  PLT 415* 549*   BMP &GFR Recent Labs  Lab 10/14/19 1545  NA 131*  K 4.1  CL 94*  CO2 25  GLUCOSE 116*  BUN 13  CREATININE 0.82  CALCIUM 8.8*   CrCl cannot be calculated (Unknown ideal weight.). Liver & Pancreas: Recent Labs  Lab 10/14/19 1545  AST 21  ALT 15  ALKPHOS 88  BILITOT 0.6  PROT 6.3*  ALBUMIN 2.4*   No results for input(s): LIPASE, AMYLASE in the last 168 hours. No results for input(s): AMMONIA in the last 168 hours. Diabetic: No results for  input(s): HGBA1C in the last 72 hours. No results for input(s): GLUCAP in the last 168 hours. Cardiac Enzymes: No results for input(s): CKTOTAL, CKMB, CKMBINDEX, TROPONINI in the last 168 hours. No results for input(s): PROBNP in the last 8760 hours. Coagulation Profile: No results for input(s): INR, PROTIME in the last 168 hours. Thyroid Function Tests: No results for input(s): TSH, T4TOTAL, FREET4, T3FREE, THYROIDAB in the last 72 hours. Lipid Profile: No results for input(s): CHOL, HDL, LDLCALC, TRIG, CHOLHDL, LDLDIRECT in the last 72 hours. Anemia Panel: No results for input(s): VITAMINB12, FOLATE, FERRITIN, TIBC, IRON, RETICCTPCT in the last 72 hours. Urine analysis:    Component Value Date/Time   COLORURINE AMBER (A) 10/14/2019 1657   APPEARANCEUR HAZY (A) 10/14/2019 1657   LABSPEC 1.019 10/14/2019 1657   PHURINE 5.0 10/14/2019 [place] 10/14/2019 1657  HGBUR SMALL (A) 10/14/2019 1657   BILIRUBINUR NEGATIVE 10/14/2019 1657   KETONESUR 5 (A) 10/14/2019 1657   PROTEINUR NEGATIVE 10/14/2019 1657   UROBILINOGEN 0.2 12/09/2013 2007   NITRITE NEGATIVE 10/14/2019 1657   LEUKOCYTESUR LARGE (A) 10/14/2019 1657   Sepsis Labs: Invalid input(s): PROCALCITONIN, [place]  Microbiology: Recent Results (from the past 240 hour(s))  SARS Coronavirus 2 by RT PCR (hospital order, performed in [facility] hospital lab) Nasopharyngeal Nasopharyngeal Swab     Status: None   Collection Time: 10/14/19  7:03 PM   Specimen: Nasopharyngeal Swab  Result Value Ref Range Status   SARS Coronavirus 2 NEGATIVE NEGATIVE Final    Comment: (NOTE) SARS-CoV-2 target nucleic acids are NOT DETECTED.  The SARS-CoV-2 RNA is generally detectable in upper and lower respiratory specimens during the acute phase of infection. The lowest concentration of SARS-CoV-2 viral copies this assay can detect is 250 copies / mL. A negative result does not preclude SARS-CoV-2 infection and should not be  used as the sole basis for treatment or other patient management decisions.  A negative result may occur with improper specimen collection / handling, submission of specimen other than nasopharyngeal swab, presence of viral mutation(s) within the areas targeted by this assay, and inadequate number of viral copies (<250 copies / mL). A negative result must be combined with clinical observations, patient history, and epidemiological information.  Fact Sheet for Patients:   StrictlyIdeas.no  Fact Sheet for Healthcare Providers: BankingDealers.co.za  This test is not yet approved or  cleared by the [place] FDA and has been authorized for detection and/or diagnosis of SARS-CoV-2 by FDA under an Emergency Use Authorization (EUA).  This EUA will remain in effect (meaning this test can be used) for the duration of the COVID-19 declaration under Section 564(b)(1) of the Act, 21 U.S.C. section 360bbb-3(b)(1), unless the authorization is terminated or revoked sooner.  Performed at [facility], [place] [address], [place], [place] [address]   Culture, blood (routine x 2)     Status: None (Preliminary result)   Collection Time: 10/14/19  7:38 PM   Specimen: BLOOD  Result Value Ref Range Status   Specimen Description   Final    BLOOD BLOOD RIGHT WRIST Performed at [facility] [address], [place], [place] [address]    Special Requests   Final    BOTTLES DRAWN AEROBIC AND ANAEROBIC Blood Culture adequate volume Performed at [facility] [address] [place], [place] [address]    Culture   Final    NO GROWTH < 12 HOURS Performed at [facility] [address] [place], [place] [address]    Report Status PENDING  Incomplete    Radiology Studies: CT CHEST W CONTRAST  Result Date: 10/14/2019 CLINICAL DATA:  Lethargy and cough EXAM: CT CHEST WITH CONTRAST TECHNIQUE:  Multidetector CT imaging of the chest was performed during intravenous contrast administration. CONTRAST:  13m OMNIPAQUE IOHEXOL 300 MG/ML  SOLN COMPARISON:  Radiograph same day FINDINGS: Cardiovascular: Aortic valve calcifications and coronary artery calcifications are seen. There is scattered aortic atherosclerosis noted. There is mild cardiomegaly. No pericardial effusion is seen. Mediastinum/Nodes: There are no enlarged mediastinal, hilar or axillary lymph nodes. Fluid is seen in a mildly dilated esophagus throughout. There is a small hiatal hernia present. Lungs/Pleura: Rounded patchy airspace consolidation seen within the bilateral upper lobes, left greater than right. There is small tree-in-bud opacity seen in the left upper lung. A moderate right  and small left pleural effusion is present. There is basilar probable atelectasis at the left lung base. Upper abdomen: The visualized portion of the upper abdomen is unremarkable. Musculoskeletal/Chest wall: There is no chest wall mass or suspicious osseous finding. No acute osseous abnormality IMPRESSION: Multifocal patchy rounded airspace opacities, most notable within the left upper lung which is likely due to multifocal pneumonia. However would recommend Followup PA and lateral chest X-ray is recommended in 3-4 weeks following trial of antibiotic therapy to ensure resolution and exclude underlying malignancy. Moderate right and small left pleural effusion Patulous distended fluid-filled esophagus with a small hiatal hernia Aortic Atherosclerosis (ICD10-I70.0). Electronically Signed   By: Prudencio Pair M.D.   On: 10/14/2019 20:36      Gery Sabedra T. [place]  If 7PM-7AM, please contact night-coverage [email or web address] 10/15/2019, 4:07 PM

## 2019-10-15 NOTE — Progress Notes (Signed)
Secure chat Dr Cyndia Skeeters T re Midline order. Midline not appropriate with medication patient is getting right now. Has working PIV, RN will place PIV consult if needed. PICC is recommended for reliable access and lab draws.

## 2019-10-15 NOTE — ED Notes (Signed)
Patient moved to hospital bed for comfort. Attempted to call daughter for an update, no response.

## 2019-10-15 NOTE — ED Notes (Signed)
Phlebotomy attempted to collect blood, unsuccessful despite multiple attempts. MD paged by Network engineer.

## 2019-10-15 NOTE — ED Notes (Signed)
Phlebotomy called for lab collection. [name] said he will be down when available.

## 2019-10-15 NOTE — ED Notes (Signed)
Lactic acid was able to be collected was not able to collect other labs. IV  Team consult is being put in.

## 2019-10-16 DIAGNOSIS — E871 Hypo-osmolality and hyponatremia: Secondary | ICD-10-CM

## 2019-10-16 LAB — BLOOD CULTURE ID PANEL (REFLEXED) - BCID2

## 2019-10-16 LAB — RENAL FUNCTION PANEL
Albumin: 2 g/dL — ABNORMAL LOW (ref 3.5–5.0)
Anion gap: 7 (ref 5–15)
BUN: 14 mg/dL (ref 8–23)
CO2: 24 mmol/L (ref 22–32)
Calcium: 8.3 mg/dL — ABNORMAL LOW (ref 8.9–10.3)
Chloride: 101 mmol/L (ref 98–111)
Creatinine, Ser: 0.62 mg/dL (ref 0.44–1.00)
GFR calc Af Amer: >60 mL/min (ref >60)
GFR calc non Af Amer: >60 mL/min (ref >60)
Glucose, Bld: 99 mg/dL (ref 70–99)
Phosphorus: 2.5 mg/dL (ref 2.5–4.6)
Potassium: 3.9 mmol/L (ref 3.5–5.1)
Sodium: 132 mmol/L — ABNORMAL LOW (ref 135–145)

## 2019-10-16 LAB — CBC
HCT: 32.2 % — ABNORMAL LOW (ref 36.0–46.0)
Hemoglobin: 9.8 g/dL — ABNORMAL LOW (ref 12.0–15.0)
MCH: 27.8 pg (ref 26.0–34.0)
MCHC: 30.4 g/dL (ref 30.0–36.0)
MCV: 91.5 fL (ref 80.0–100.0)
Platelets: 199 10*3/uL (ref 150–400)
RBC: 3.52 MIL/uL — ABNORMAL LOW (ref 3.87–5.11)
RDW: 17.7 % — ABNORMAL HIGH (ref 11.5–15.5)
WBC: 8.6 10*3/uL (ref 4.0–10.5)
nRBC: 0 % (ref 0.0–0.2)

## 2019-10-16 LAB — LEGIONELLA PNEUMOPHILA SEROGP 1 UR AG: L. pneumophila Serogp 1 Ur Ag: NEGATIVE

## 2019-10-16 MED ORDER — AZITHROMYCIN 250 MG PO TABS
250.0000 mg | ORAL_TABLET | Freq: Every day | ORAL | Status: DC
  Administered 2019-10-16 – 2019-10-17 (×2): 250 mg via ORAL
  Filled 2019-10-16 (×2): qty 1

## 2019-10-16 MED ORDER — AMOXICILLIN-POT CLAVULANATE 500-125 MG PO TABS
1.0000 | ORAL_TABLET | Freq: Two times a day (BID) | ORAL | Status: DC
  Administered 2019-10-16 – 2019-10-17 (×3): 500 mg via ORAL
  Filled 2019-10-16 (×4): qty 1

## 2019-10-16 NOTE — ED Notes (Signed)
Multiple attempts to reach phlebotomy for blood draw unanswered.

## 2019-10-16 NOTE — Plan of Care (Signed)
  Problem: Education: Goal: Knowledge of General Education information will improve Description: Including pain rating scale, medication(s)/side effects and non-pharmacologic comfort measures Outcome: Progressing   Problem: Health Behavior/Discharge Planning: Goal: Ability to manage health-related needs will improve Outcome: Progressing   Problem: Clinical Measurements: Goal: Ability to maintain clinical measurements within normal limits will improve Outcome: Progressing Goal: Will remain free from infection Outcome: Progressing Goal: Diagnostic test results will improve Outcome: Progressing Goal: Respiratory complications will improve Outcome: Progressing Goal: Cardiovascular complication will be avoided Outcome: Progressing   Problem: Activity: Goal: Risk for activity intolerance will decrease Outcome: Progressing   Problem: Nutrition: Goal: Adequate nutrition will be maintained Outcome: Progressing   Problem: Coping: Goal: Level of anxiety will decrease Outcome: Progressing   Problem: Elimination: Goal: Will not experience complications related to bowel motility Outcome: Progressing Goal: Will not experience complications related to urinary retention Outcome: Progressing   Problem: Pain Managment: Goal: General experience of comfort will improve Outcome: Progressing   Problem: Safety: Goal: Ability to remain free from injury will improve Outcome: Progressing   Problem: Skin Integrity: Goal: Risk for impaired skin integrity will decrease Outcome: Progressing  2130- Admitted from ED not in distress, daughter Roderic Scarce informed of the admission thru phone call and acknowledged.

## 2019-10-16 NOTE — ED Notes (Signed)
Unable to obtain labs x2 unsuccessful sticks

## 2019-10-16 NOTE — Progress Notes (Signed)
PROGRESS NOTE  [name]:[id number] DOB: Dec 19, 1920   PCP: Lawerance Cruel, MD  Patient is from: [facility]  DOA: 10/14/2019 LOS: 1  Brief Narrative / Interim history: 86 year old female with PMH of permanent A. fib on Eliquis, dementia, HTN, GERD and remote history of breast cancer who was brought to ED due to weakness, lethargy and concern for pneumonia.  In ED, met criteria for severe sepsis/septic shock with tachycardia, tachypnea, leukocytosis and lactic acidosis to 4.1.  Source of infection to include multifocal pneumonia and UTI.  Cultures obtained.  She was started on ceftriaxone and azithromycin and admitted.  Subjective: Seen and examined earlier this morning.  No major events overnight of this morning.  No complaints.  She denies pain, shortness of breath, nausea, vomiting, abdominal pain or UTI symptoms.  However, she is not a great historian.  She is only oriented to self and place.   Objective: Vitals:   10/16/19 1407 10/16/19 1409 10/16/19 1410 10/16/19 1419  BP: 120/75   120/75  Pulse:  74 77 75  Resp: (!) 22 (!) 21 (!) 21 20  Temp:      TempSrc:      SpO2:  100% 100% 100%    Intake/Output Summary (Last 24 hours) at 10/16/2019 1435 Last data filed at 10/16/2019 0855 Gross per 24 hour  Intake 2014.65 ml  Output --  Net 2014.65 ml   There were no vitals filed for this visit.  Examination: GENERAL: Frail looking elderly female.  No distress.  Nontoxic. HEENT: MMM.  Vision and hearing grossly intact.  NECK: Supple.  No apparent JVD.  RESP: 97% on RA.  No IWOB.  Fair aeration bilaterally. CVS:  RRR. Heart sounds normal.  ABD/GI/GU: BS+. Abd soft, NTND.  MSK/EXT:  Moves extremities. No apparent deformity. No edema.  SKIN: no apparent skin lesion or wound NEURO: Awake.  Oriented to self and place only.  Follows commands.  No apparent focal neuro deficit but bilateral lower extremity weakness PSYCH: Calm. Normal affect.  Procedures:   None  Microbiology summarized: COVID-19 PCR negative. Blood cultures NGTD. Urine cultures pending.  Assessment & Plan: Severe sepsis/septic shock-meet criteria on admission with tachycardia, tachypnea, leukocytosis, significant lactic acidosis to 4.1 and encephalopathy.  Source of infection include multifocal pneumonia and UTI.  Sepsis physiology resolving.  Hemodynamically stable.  Urine cultures negative.  Blood culture with staph species in 1 out of 4 bottles likely contaminant.  -Received IV ceftriaxone and azithromycin. -Transition to p.o. Augmentin and azithromycin to complete 5 days course.. -Supportive care with incentive spirometry, mucolytic's and antitussive.  Multifocal pneumonia: CT chest with multifocal patchy rounded area opacities most notably within LUL. -Antibiotics as above -Repeat two-view chest x-ray in 3 to 4 weeks. -Supportive care as above  Abnormal blood culture: Staph species in 1 out of 4 bottles likely contaminant.  Not a staph aureus.  Acute cystitis with hematuria: Patient denies UTI symptoms but not a reliable historian.  Urine culture negative. -Covered with antibiotics as above  Acute metabolic encephalopathy in patient with history of dementia: Likely due to infectious process and dehydration. -Treat infectious process as above. -Reorientation and delirium precautions.  Permanent atrial fibrillation: Rate controlled. -Continue home regimen-metoprolol and Eliquis  Essential hypertension: Normotensive. -Continue home metoprolol. -Continue IV fluid.  Mild hyponatremia: Na 132. -Recheck in the morning. -Work-up if no improvement.  Lactic acidosis: Likely due to sepsis.  Resolving.   GOC/DNR/DNI: Appropriate.  Debility/physical deconditioning -Therapy recommended SNF.  There is no height or weight on file to calculate BMI.         DVT prophylaxis:  apixaban (ELIQUIS) tablet 2.5 mg Start: 10/14/19 2200 apixaban (ELIQUIS) tablet  2.5 mg  Code Status: DNR/DNI Family Communication: Updated patient's daughter over the phone on 9/2.Marland Kitchen Status is: Inpatient  Remains inpatient appropriate because:Unsafe d/c plan   Dispo: The patient is from: SNF              Anticipated d/c is to: SNF              Anticipated d/c date is: 1 day              Patient currently is medically stable to d/c.       Consultants:  None   Sch Meds:  Scheduled Meds: . amLODipine  5 mg Oral Daily  . amoxicillin-clavulanate  1 tablet Oral BID  . apixaban  2.5 mg Oral BID  . azithromycin  250 mg Oral Daily  . melatonin  5 mg Oral QHS  . metoprolol succinate  50 mg Oral Daily  . multivitamin with minerals  1 tablet Oral Daily  . pantoprazole  40 mg Oral Daily  . sodium chloride flush  10-40 mL Intracatheter Q12H   Continuous Infusions: . sodium chloride 75 mL/hr at 10/16/19 0922   PRN Meds:.acetaminophen **OR** acetaminophen, ondansetron **OR** ondansetron (ZOFRAN) IV, polyethylene glycol, sodium chloride flush  Antimicrobials: Anti-infectives (From admission, onward)   Start     Dose/Rate Route Frequency Ordered Stop   10/16/19 1000  amoxicillin-clavulanate (AUGMENTIN) 500-125 MG per tablet 500 mg        1 tablet Oral 2 times daily 10/16/19 0702 10/20/19 0959   10/16/19 1000  azithromycin (ZITHROMAX) tablet 250 mg        250 mg Oral Daily 10/16/19 0702 10/19/19 0959   10/15/19 1200  cefTRIAXone (ROCEPHIN) 1 g in sodium chloride 0.9 % 100 mL IVPB  Status:  Discontinued        1 g 200 mL/hr over 30 Minutes Intravenous Every 24 hours 10/14/19 1938 10/16/19 0702   10/14/19 1945  azithromycin (ZITHROMAX) 500 mg in sodium chloride 0.9 % 250 mL IVPB  Status:  Discontinued        500 mg 250 mL/hr over 60 Minutes Intravenous Every 24 hours 10/14/19 1938 10/16/19 0702       I have personally reviewed the following labs and images: CBC: Recent Labs  Lab 10/14/19 1545 10/15/19 0630  WBC 13.8* 12.3*  NEUTROABS 11.5* 9.0*  HGB  12.4 12.2  HCT 40.2 40.1  MCV 89.7 90.5  PLT 415* 549*   BMP &GFR Recent Labs  Lab 10/14/19 1545 10/16/19 0550  NA 131* 132*  K 4.1 3.9  CL 94* 101  CO2 25 24  GLUCOSE 116* 99  BUN 13 14  CREATININE 0.82 0.62  CALCIUM 8.8* 8.3*  PHOS  --  2.5   CrCl cannot be calculated (Unknown ideal weight.). Liver & Pancreas: Recent Labs  Lab 10/14/19 1545 10/16/19 0550  AST 21  --   ALT 15  --   ALKPHOS 88  --   BILITOT 0.6  --   PROT 6.3*  --   ALBUMIN 2.4* 2.0*   No results for input(s): LIPASE, AMYLASE in the last 168 hours. No results for input(s): AMMONIA in the last 168 hours. Diabetic: No results for input(s): HGBA1C in the last 72 hours. No results for input(s): GLUCAP in the last 168  hours. Cardiac Enzymes: No results for input(s): CKTOTAL, CKMB, CKMBINDEX, TROPONINI in the last 168 hours. No results for input(s): PROBNP in the last 8760 hours. Coagulation Profile: No results for input(s): INR, PROTIME in the last 168 hours. Thyroid Function Tests: No results for input(s): TSH, T4TOTAL, FREET4, T3FREE, THYROIDAB in the last 72 hours. Lipid Profile: No results for input(s): CHOL, HDL, LDLCALC, TRIG, CHOLHDL, LDLDIRECT in the last 72 hours. Anemia Panel: No results for input(s): VITAMINB12, FOLATE, FERRITIN, TIBC, IRON, RETICCTPCT in the last 72 hours. Urine analysis:    Component Value Date/Time   COLORURINE AMBER (A) 10/14/2019 1657   APPEARANCEUR HAZY (A) 10/14/2019 1657   LABSPEC 1.019 10/14/2019 1657   PHURINE 5.0 10/14/2019 1657   GLUCOSEU NEGATIVE 10/14/2019 1657   HGBUR SMALL (A) 10/14/2019 1657   BILIRUBINUR NEGATIVE 10/14/2019 1657   KETONESUR 5 (A) 10/14/2019 1657   PROTEINUR NEGATIVE 10/14/2019 1657   UROBILINOGEN 0.2 12/09/2013 2007   NITRITE NEGATIVE 10/14/2019 1657   LEUKOCYTESUR LARGE (A) 10/14/2019 1657   Sepsis Labs: Invalid input(s): PROCALCITONIN, [place]  Microbiology: Recent Results (from the past 240 hour(s))  SARS Coronavirus  2 by RT PCR (hospital order, performed in [facility] hospital lab) Nasopharyngeal Nasopharyngeal Swab     Status: None   Collection Time: 10/14/19  7:03 PM   Specimen: Nasopharyngeal Swab  Result Value Ref Range Status   SARS Coronavirus 2 NEGATIVE NEGATIVE Final    Comment: (NOTE) SARS-CoV-2 target nucleic acids are NOT DETECTED.  The SARS-CoV-2 RNA is generally detectable in upper and lower respiratory specimens during the acute phase of infection. The lowest concentration of SARS-CoV-2 viral copies this assay can detect is 250 copies / mL. A negative result does not preclude SARS-CoV-2 infection and should not be used as the sole basis for treatment or other patient management decisions.  A negative result may occur with improper specimen collection / handling, submission of specimen other than nasopharyngeal swab, presence of viral mutation(s) within the areas targeted by this assay, and inadequate number of viral copies (<250 copies / mL). A negative result must be combined with clinical observations, patient history, and epidemiological information.  Fact Sheet for Patients:   StrictlyIdeas.no  Fact Sheet for Healthcare Providers: BankingDealers.co.za  This test is not yet approved or  cleared by the [place] FDA and has been authorized for detection and/or diagnosis of SARS-CoV-2 by FDA under an Emergency Use Authorization (EUA).  This EUA will remain in effect (meaning this test can be used) for the duration of the COVID-19 declaration under Section 564(b)(1) of the Act, 21 U.S.C. section 360bbb-3(b)(1), unless the authorization is terminated or revoked sooner.  Performed at [facility], [place] [address] [place], [place] [address]   Culture, blood (routine x 2)     Status: None (Preliminary result)   Collection Time: 10/14/19  7:38 PM   Specimen: BLOOD  Result Value Ref Range Status   Specimen  Description   Final    BLOOD BLOOD RIGHT WRIST Performed at [place] [address], [place], [place] [address]    Special Requests   Final    BOTTLES DRAWN AEROBIC AND ANAEROBIC Blood Culture adequate volume Performed at [place] [address] [place], [place] [address]    Culture   Final    NO GROWTH 2 DAYS Performed at [place] [address] [place], [place] [address]    Report Status PENDING  Incomplete  Culture,  Urine     Status: None   Collection Time: 10/14/19  7:41 PM   Specimen: Urine, Random  Result Value Ref Range Status   Specimen Description   Final    URINE, RANDOM Performed at [place] [address] [place], [place] [address]    Special Requests   Final    NONE Performed at [facility], [place] [address] [place], [place] [address]    Culture   Final    NO GROWTH Performed at [facility], [place] [address], [place], [place] [address]    Report Status 10/15/2019 FINAL  Final  Culture, blood (routine x 2)     Status: None (Preliminary result)   Collection Time: 10/14/19 10:35 PM   Specimen: BLOOD  Result Value Ref Range Status   Specimen Description   Final    BLOOD BLOOD RIGHT ARM Performed at [place] [address] [place], [place] [address]    Special Requests   Final    BOTTLES DRAWN AEROBIC AND ANAEROBIC Blood Culture adequate volume Performed at [place] [address] [place], [place] [address]    Culture  Setup Time   Final    GRAM POSITIVE COCCI IN CLUSTERS AEROBIC BOTTLE ONLY CRITICAL RESULT CALLED TO, READ BACK BY AND VERIFIED WITH: Arnette Felts [id number] [id number] FCP Performed at [facility], [place] [address], [place], [place] [address]    Culture GRAM POSITIVE COCCI  Final   Report Status PENDING  Incomplete  Blood Culture ID Panel (Reflexed)     Status: Abnormal   Collection Time:  10/14/19 10:35 PM  Result Value Ref Range Status   Enterococcus faecalis NOT DETECTED NOT DETECTED Final   Enterococcus Faecium NOT DETECTED NOT DETECTED Final   Listeria monocytogenes NOT DETECTED NOT DETECTED Final   Staphylococcus species DETECTED (A) NOT DETECTED Final    Comment: CRITICAL RESULT CALLED TO, READ BACK BY AND VERIFIED WITH: PHARMD G. GADHI [phone] FCP    Staphylococcus aureus (BCID) NOT DETECTED NOT DETECTED Final   Staphylococcus epidermidis NOT DETECTED NOT DETECTED Final   Staphylococcus lugdunensis NOT DETECTED NOT DETECTED Final   Streptococcus species NOT DETECTED NOT DETECTED Final   Streptococcus agalactiae NOT DETECTED NOT DETECTED Final   Streptococcus pneumoniae NOT DETECTED NOT DETECTED Final   Streptococcus pyogenes NOT DETECTED NOT DETECTED Final   A.calcoaceticus-baumannii NOT DETECTED NOT DETECTED Final   Bacteroides fragilis NOT DETECTED NOT DETECTED Final   Enterobacterales NOT DETECTED NOT DETECTED Final   Enterobacter cloacae complex NOT DETECTED NOT DETECTED Final   Escherichia coli NOT DETECTED NOT DETECTED Final   Klebsiella aerogenes NOT DETECTED NOT DETECTED Final   Klebsiella oxytoca NOT DETECTED NOT DETECTED Final   Klebsiella pneumoniae NOT DETECTED NOT DETECTED Final   Proteus species NOT DETECTED NOT DETECTED Final   Salmonella species NOT DETECTED NOT DETECTED Final   Serratia marcescens NOT DETECTED NOT DETECTED Final   Haemophilus influenzae NOT DETECTED NOT DETECTED Final   Neisseria meningitidis NOT DETECTED NOT DETECTED Final   Pseudomonas aeruginosa NOT DETECTED NOT DETECTED Final   Stenotrophomonas maltophilia NOT DETECTED NOT DETECTED Final   Candida albicans NOT DETECTED NOT DETECTED Final   Candida auris NOT DETECTED NOT DETECTED Final   Candida glabrata NOT DETECTED NOT DETECTED Final   Candida krusei NOT DETECTED NOT DETECTED Final   Candida parapsilosis NOT DETECTED NOT DETECTED Final   Candida tropicalis NOT  DETECTED  NOT DETECTED Final   Cryptococcus neoformans/gattii NOT DETECTED NOT DETECTED Final    Comment: Performed at [facility], [place] [address], [place], [place] [address]    Radiology Studies: No results found.    Tiajuana Leppanen T. [place]  If 7PM-7AM, please contact night-coverage [email or web address] 10/16/2019, 2:35 PM

## 2019-10-16 NOTE — Progress Notes (Signed)
Occupational Therapy Evaluation  Unsure of patient's baseline with mobility and self care, able to state she is in the hospital but cannot tell me where she lives. Does say people help her with self care but cannot provide further detail. Patient require mod A x2 for functional transfer from gurney <> bedside commode due to decreased balance, safety, activity tolerance with patient attempting to sit before lined up with surface. Total A for peri care after small bowel movement. Recommend return to [facility] with increased assist for ADLs as needed, will continue to follow with acute OT services to maximize patient activity tolerance, strength, balance in order to reduce caregiver burden with self care.    10/16/19 1400  OT Visit Information  Last OT Received On 10/16/19  Assistance Needed +2  PT/OT/SLP Co-Evaluation/Treatment Yes  Reason for Co-Treatment For patient/therapist safety;To address functional/ADL transfers;Necessary to address cognition/behavior during functional activity  OT goals addressed during session ADL's and self-care  History of Present Illness Pt admitted with sepsis, PNA, and AMS and with hx of COVID, Breast CA, a-fib and dementia  Precautions  Precautions Fall  Restrictions  Weight Bearing Restrictions No  Home Living  Family/patient expects to be discharged to: Skilled nursing facility  Prior Function  Level of Independence Needs assistance  Gait / Transfers Assistance Needed pt reports using RW for mobility   Comments limited history, pt with mild dementia therefore unsure of accuracy. patient does state people help her with self care  Communication  Communication HOH  Pain Assessment  Pain Assessment Faces  Faces Pain Scale 6  Pain Location Pt reports pain but unable to localize other than indicating IV site on R hand  Pain Descriptors / Indicators Grimacing;Moaning  Pain Intervention(s) Limited activity within patient's tolerance  Cognition   Arousal/Alertness Awake/alert  Behavior During Therapy WFL for tasks assessed/performed  Overall Cognitive Status History of cognitive impairments - at baseline  Upper Extremity Assessment  Upper Extremity Assessment Generalized weakness  Lower Extremity Assessment  Lower Extremity Assessment Defer to PT evaluation  Cervical / Trunk Assessment  Cervical / Trunk Assessment Kyphotic  ADL  Overall ADL's  Needs assistance/impaired  Grooming Sitting;Supervision/safety  Upper Body Bathing Minimal assistance;Sitting  Lower Body Bathing Maximal assistance;Sitting/lateral leans;Sit to/from stand  Upper Body Dressing  Minimal assistance;Sitting  Lower Body Dressing Sitting/lateral leans;Sit to/from stand;Total assistance  Lower Body Dressing Details (indicate cue type and reason) to don socks  Toilet Transfer Moderate assistance;+2 for physical assistance;+2 for safety/equipment;Cueing for safety;Cueing for sequencing;Stand-pivot;BSC;RW  Toilet Transfer Details (indicate cue type and reason) posterior lean, decreased safety  Toileting- Clothing Manipulation and Hygiene Total assistance;Sit to/from stand  Toileting - Clothing Manipulation Details (indicate cue type and reason) peri care after bowel movement  Functional mobility during ADLs Moderate assistance;+2 for physical assistance;+2 for safety/equipment;Cueing for safety;Cueing for sequencing;Rolling walker  General ADL Comments unsure of patient's baseline level of assistance, currently requiring mod x2 for functional transfers  Bed Mobility  Overal bed mobility Needs Assistance  Bed Mobility Supine to Sit;Sit to Supine  Supine to sit Mod assist;+2 for physical assistance;+2 for safety/equipment  Sit to supine Mod assist;+2 for physical assistance;+2 for safety/equipment  General bed mobility comments Increased time with assist to manage LEs and to control trunk  Transfers  Overall transfer level Needs assistance  Equipment used Rolling  walker (2 wheeled);None  Transfers Sit to/from [facility]  Sit to Stand Mod assist;+2 physical assistance;+2 safety/equipment  Stand pivot transfers Mod assist;+2 physical assistance;+2 safety/equipment  General transfer comment Stand pvt bed to [facility] without AD and with RW to return to bed, kyphotic posture with posterior lean  Balance  Overall balance assessment Needs assistance  Sitting-balance support Feet unsupported  Sitting balance-Leahy Scale Fair  Standing balance support Bilateral upper extremity supported  Standing balance-Leahy Scale Poor  General Comments  General comments (skin integrity, edema, etc.) difficulty obtaining accurate waveform, patient did not appear in respiratory distress with activity  OT - End of Session  Equipment Utilized During Treatment Rolling walker  Activity Tolerance Patient tolerated treatment well  Patient left in bed;with call bell/phone within reach  Nurse Communication Mobility status  OT Assessment  OT Recommendation/Assessment Patient needs continued OT Services  OT Visit Diagnosis Unsteadiness on feet (R26.81);Other abnormalities of gait and mobility (R26.89);Muscle weakness (generalized) (M62.81)  OT Problem List Decreased activity tolerance;Decreased strength;Impaired balance (sitting and/or standing);Decreased safety awareness  OT Plan  OT Frequency (ACUTE ONLY) Min 2X/week  OT Treatment/Interventions (ACUTE ONLY) Self-care/ADL training;Therapeutic exercise;Therapeutic activities;Patient/family education;Balance training  AM-PAC OT "6 Clicks" Daily Activity Outcome Measure (Version 2)  Help from another person eating meals? 3  Help from another person taking care of personal grooming? 3  Help from another person toileting, which includes using toliet, bedpan, or urinal? 2  Help from another person bathing (including washing, rinsing, drying)? 2  Help from another person to put on and taking off regular upper body clothing?  3  Help from another person to put on and taking off regular lower body clothing? 1  6 Click Score 14  OT Recommendation  Follow Up Recommendations SNF;Other (comment) (return to [facility])  OT Equipment None recommended by OT  Individuals Consulted  Consulted and Agree with Results and Recommendations Patient  Acute Rehab OT Goals  Patient Stated Goal I need to use the bathroom  OT Goal Formulation With patient  Time For Goal Achievement 10/30/19  Potential to Achieve Goals Fair  OT Time Calculation  OT Start Time (ACUTE ONLY) 1057  OT Stop Time (ACUTE ONLY) 1130  OT Time Calculation (min) 33 min  OT General Charges  $OT Visit 1 Visit  OT Evaluation  $OT Eval Moderate Complexity 1 Mod  Written Expression  Dominant Hand  (did not specify)   Delbert Phenix OT OT pager: [phone]

## 2019-10-16 NOTE — Progress Notes (Signed)
PHARMACY - PHYSICIAN COMMUNICATION CRITICAL VALUE ALERT - BLOOD CULTURE IDENTIFICATION (BCID)  [name] is an 86 y.o. female who presented to [facility] on 10/14/2019 with a chief complaint of PNA  Assessment:  1/4 BCx with GPC in clusters, Staph species without resistance per BCID. Patient on ceftriaxone/azith >> augmentin/azith afebrile, WBC mildly elevated, appears to be contaminant  Name of physician (or Provider) Contacted: Dr. Cyndia Skeeters  Current antibiotics: Augmentin/azithromycin  Changes to prescribed antibiotics recommended:  none  Results for orders placed or performed during the hospital encounter of 10/14/19  Blood Culture ID Panel (Reflexed) (Collected: 10/14/2019 10:35 PM)  Result Value Ref Range   Enterococcus faecalis NOT DETECTED NOT DETECTED   Enterococcus Faecium NOT DETECTED NOT DETECTED   Listeria monocytogenes NOT DETECTED NOT DETECTED   Staphylococcus species DETECTED (A) NOT DETECTED   Staphylococcus aureus (BCID) NOT DETECTED NOT DETECTED   Staphylococcus epidermidis NOT DETECTED NOT DETECTED   Staphylococcus lugdunensis NOT DETECTED NOT DETECTED   Streptococcus species NOT DETECTED NOT DETECTED   Streptococcus agalactiae NOT DETECTED NOT DETECTED   Streptococcus pneumoniae NOT DETECTED NOT DETECTED   Streptococcus pyogenes NOT DETECTED NOT DETECTED   A.calcoaceticus-baumannii NOT DETECTED NOT DETECTED   Bacteroides fragilis NOT DETECTED NOT DETECTED   Enterobacterales NOT DETECTED NOT DETECTED   Enterobacter cloacae complex NOT DETECTED NOT DETECTED   Escherichia coli NOT DETECTED NOT DETECTED   Klebsiella aerogenes NOT DETECTED NOT DETECTED   Klebsiella oxytoca NOT DETECTED NOT DETECTED   Klebsiella pneumoniae NOT DETECTED NOT DETECTED   Proteus species NOT DETECTED NOT DETECTED   Salmonella species NOT DETECTED NOT DETECTED   Serratia marcescens NOT DETECTED NOT DETECTED   Haemophilus influenzae NOT DETECTED NOT DETECTED   Neisseria meningitidis  NOT DETECTED NOT DETECTED   Pseudomonas aeruginosa NOT DETECTED NOT DETECTED   Stenotrophomonas maltophilia NOT DETECTED NOT DETECTED   Candida albicans NOT DETECTED NOT DETECTED   Candida auris NOT DETECTED NOT DETECTED   Candida glabrata NOT DETECTED NOT DETECTED   Candida krusei NOT DETECTED NOT DETECTED   Candida parapsilosis NOT DETECTED NOT DETECTED   Candida tropicalis NOT DETECTED NOT DETECTED   Cryptococcus neoformans/gattii NOT DETECTED NOT DETECTED    Napoleon Form 10/16/2019  12:50 PM

## 2019-10-16 NOTE — ED Notes (Signed)
2nd RN Clarene Critchley attempted lab stick. Multiple attempts made was only able to obtain 5cc of blood.

## 2019-10-16 NOTE — Evaluation (Signed)
Physical Therapy Evaluation Patient Details Name: [name] MRN: [id number] DOB: 1920/07/30 Today's Date: 10/16/2019   History of Present Illness  Pt admitted with sepsis, PNA, and AMS and with hx of COVID, Breast CA, a-fib and dementia  Clinical Impression  Pt admitted as above and presenting with functional mobility limitations 2* generalized weakness, limited endurance and balance deficits.  Pt currently requiring assist of two for safe performance of basic mobility tasks and would best be served at dc with return to previous SNL level care.    Follow Up Recommendations SNF    Equipment Recommendations  None recommended by PT    Recommendations for Other Services       Precautions / Restrictions Precautions Precautions: Fall Restrictions Weight Bearing Restrictions: No      Mobility  Bed Mobility Overal bed mobility: Needs Assistance Bed Mobility: Supine to Sit;Sit to Supine     Supine to sit: Mod assist;+2 for physical assistance;+2 for safety/equipment Sit to supine: Mod assist;+2 for physical assistance;+2 for safety/equipment   General bed mobility comments: Increased time with assist to manage LEs and to control trunk  Transfers Overall transfer level: Needs assistance Equipment used: Rolling walker (2 wheeled);None Transfers: Sit to/from [facility] Sit to Stand: Mod assist;+2 physical assistance;+2 safety/equipment Stand pivot transfers: Mod assist;+2 physical assistance;+2 safety/equipment       General transfer comment: Stand pvt bed to [facility] sans AD and with RW to return to bed  Ambulation/Gait Ambulation/Gait assistance: Min assist;Mod assist;+2 safety/equipment Gait Distance (Feet): 3 Feet Assistive device: Rolling walker (2 wheeled) Gait Pattern/deviations: Step-to pattern;Decreased step length - right;Decreased step length - left;Shuffle;Trunk flexed Gait velocity: decr   General Gait Details: cues or posture and position  from RW, increased time with physical assist for balance/support  Stairs            Wheelchair Mobility    Modified Rankin (Stroke Patients Only)       Balance Overall balance assessment: Needs assistance Sitting-balance support: Bilateral upper extremity supported;Feet unsupported Sitting balance-Leahy Scale: Fair     Standing balance support: Bilateral upper extremity supported Standing balance-Leahy Scale: Poor                               Pertinent Vitals/Pain Pain Assessment: Faces Faces Pain Scale: Hurts even more Pain Location: Pt reports pain but unable to localize other than indicating IV site on R hand Pain Descriptors / Indicators: Grimacing Pain Intervention(s): Limited activity within patient's tolerance;Monitored during session    Home Living Family/patient expects to be discharged to:: Skilled nursing facility                      Prior Function Level of Independence: Needs assistance   Gait / Transfers Assistance Needed: pt reports using RW for mobility      Comments: limited history, pt with mild dementia therefore unsure of accuracy      Hand Dominance        Extremity/Trunk Assessment   Upper Extremity Assessment Upper Extremity Assessment: Generalized weakness    Lower Extremity Assessment Lower Extremity Assessment: Generalized weakness    Cervical / Trunk Assessment Cervical / Trunk Assessment: Kyphotic  Communication   Communication: HOH  Cognition Arousal/Alertness: Awake/alert Behavior During Therapy: WFL for tasks assessed/performed Overall Cognitive Status: History of cognitive impairments - at baseline  General Comments      Exercises     Assessment/Plan    PT Assessment Patient needs continued PT services  PT Problem List Decreased strength;Decreased activity tolerance;Decreased balance;Decreased mobility;Decreased knowledge of use of  DME;Decreased cognition       PT Treatment Interventions DME instruction;Gait training;Functional mobility training;Therapeutic activities;Therapeutic exercise;Balance training;Cognitive remediation;Patient/family education    PT Goals (Current goals can be found in the Care Plan section)  Acute Rehab PT Goals Patient Stated Goal: I need to use the bathroom PT Goal Formulation: With patient Time For Goal Achievement: 10/30/19 Potential to Achieve Goals: Fair    Frequency Min 2X/week   Barriers to discharge        Co-evaluation               AM-PAC PT "6 Clicks" Mobility  Outcome Measure Help needed turning from your back to your side while in a flat bed without using bedrails?: A Little Help needed moving from lying on your back to sitting on the side of a flat bed without using bedrails?: A Lot Help needed moving to and from a bed to a chair (including a wheelchair)?: A Lot Help needed standing up from a chair using your arms (e.g., wheelchair or bedside chair)?: A Lot Help needed to walk in hospital room?: A Lot Help needed climbing 3-5 steps with a railing? : Total 6 Click Score: 12    End of Session Equipment Utilized During Treatment: Gait belt Activity Tolerance: Patient limited by fatigue;Patient limited by pain Patient left: in bed;with call bell/phone within reach Nurse Communication: Mobility status PT Visit Diagnosis: Unsteadiness on feet (R26.81);Difficulty in walking, not elsewhere classified (R26.2);Muscle weakness (generalized) (M62.81)    Time: 1100-1134 PT Time Calculation (min) (ACUTE ONLY): 34 min   Charges:   PT Evaluation $PT Eval Low Complexity: 1 Low          [place] Pager [phone] Office [phone]   Leeandre Nordling 10/16/2019, 1:27 PM

## 2019-10-17 DIAGNOSIS — R5381 Other malaise: Secondary | ICD-10-CM

## 2019-10-17 DIAGNOSIS — D649 Anemia, unspecified: Secondary | ICD-10-CM

## 2019-10-17 MED ORDER — AMOXICILLIN-POT CLAVULANATE 500-125 MG PO TABS: 1.0000 | ORAL_TABLET | Freq: Two times a day (BID) | ORAL | 0 refills | Status: DC

## 2019-10-17 MED ORDER — FUROSEMIDE 20 MG PO TABS: 20.0000 mg | ORAL_TABLET | Freq: Every day | ORAL | 3 refills | Status: AC | PRN

## 2019-10-17 NOTE — Progress Notes (Signed)
Spoke with Daughter regarding patient status and planned discharge back to [facility] today. Attempted to call report to [facility] place with RN unavailable and was told nurse would call [facility] RN back to obtain report. Direct number provided.

## 2019-10-17 NOTE — Discharge Summary (Signed)
Physician Discharge Summary  [name] NIO:[id number] DOB: July 26, 1920 DOA: 10/14/2019  PCP: Lawerance Cruel, MD  Admit date: 10/14/2019 Discharge date: 10/17/2019  Admitted From: [name], SNF Disposition: [name], SNF  Recommendations for Outpatient Follow-up:  1. Follow ups as below. 2. Please obtain CBC/BMP/Mag at follow up 3. Repeat chest x-ray in 3 to 4 weeks. 4. Please follow up on the following pending results: None   Discharge Condition: Stable CODE STATUS: DNR/DNI   Hospital Course: 86 year old female with PMH of permanent A. fib on Eliquis, dementia, HTN, GERD and remote history of breast cancer who was brought to ED due to weakness, lethargy and concern for pneumonia.  In ED, met criteria for severe sepsis/septic shock with tachycardia, tachypnea, leukocytosis and lactic acidosis to 4.1.  Source of infection to include multifocal pneumonia and UTI.  Cultures obtained.  She was started on ceftriaxone and azithromycin and admitted.  Sepsis physiology resolved.  Urine culture remain negative.  Blood culture with staph species in 1 out of 4 bottles thought to be contaminant.  Antibiotic deescalated to Augmentin and azithromycin. She is discharged on p.o. Augmentin for 4 more days to complete treatment course.  Patient was evaluated by therapy who recommended SNF.  Patient will be going back to [facility].  Patient's daughter updated on the day of discharge.  See individual problem list below for more hospital course.  Discharge Diagnoses:  Severe sepsis/septic shock-meet criteria on admission with tachycardia, tachypnea, leukocytosis, significant lactic acidosis to 4.1 and encephalopathy.  Source of infection include multifocal pneumonia and UTI.  Sepsis physiology resolving.  Hemodynamically stable.  Urine cultures negative.  Blood culture with staph species in 1 out of 4 bottles likely contaminant.  -Ceftriaxone 8/31-9/1.  Azithromycin 8/31-9/3.   Augmentin 9/2-9/7  Multifocal pneumonia: CT chest with multifocal patchy rounded area opacities most notably within LUL. -Antibiotics as above -Repeat two-view chest x-ray in 3 to 4 weeks.  Abnormal blood culture: Staph species in 1 out of 4 bottles likely contaminant.  Not a staph aureus.  Acute cystitis with hematuria: Patient denies UTI symptoms but not a reliable historian.  Urine culture negative. -Covered with antibiotics as above  Acute metabolic encephalopathy in patient with history of dementia: Likely due to infectious process and dehydration.  Oriented to self, place and situation.  Likely baseline.  No apparent focal neuro deficit other than generalized weakness. -Treat infectious process as above. -Reorientation and delirium precautions.  Permanent atrial fibrillation: Rate controlled. -Continue home regimen-metoprolol and Eliquis  Essential hypertension: Normotensive. -Continue home medications.  Normocytic anemia: Hgb dropped from 12-10 likely dilutional from IV fluid as all cell lines dropped. -Recheck CBC in 1 week.  Mild hyponatremia: Na 132.  Stable. -Recheck BMP in 1 week  Lactic acidosis: Likely due to sepsis.  Resolving.  Leukocytosis: Resolved.  GOC/DNR/DNI: Appropriate.  Debility/physical deconditioning -Continue PT/OT at SNF.   Body mass index is 23.19 kg/m.            Discharge Exam: Vitals:   10/17/19 0600 10/17/19 0936  BP: (!) 163/108 (!) 141/92  Pulse: 92 76  Resp: (!) 24 (!) 24  Temp: 97.9 F (36.6 C) 97.7 F (36.5 C)  SpO2: (!) 88% 96%    GENERAL: Frail looking elderly female.  No distress.  Nontoxic. HEENT: MMM.  Vision and hearing grossly intact.  NECK: Supple.  No apparent JVD.  RESP: On room air.  No IWOB.  Fair aeration bilaterally. CVS:  RRR. Heart sounds normal.  ABD/GI/GU: Bowel sounds present. Soft. Non tender.  MSK/EXT:  Moves extremities. No apparent deformity. No edema.  SKIN: no apparent skin  lesion or wound NEURO: Awake, alert and oriented to self, place and situation.  No apparent focal neuro deficit other than generalized weakness PSYCH: Calm. Normal affect.   Discharge Instructions  Discharge Instructions    Diet - low sodium heart healthy   Complete by: As directed      Allergies as of 10/17/2019      Reactions   Codeine Nausea And Vomiting      Medication List    TAKE these medications   acetaminophen 325 MG tablet Commonly known as: TYLENOL Take 2 tablets (650 mg total) by mouth every 6 (six) hours as needed for mild pain (or Fever >/= 101).   amoxicillin-clavulanate 500-125 MG tablet Commonly known as: AUGMENTIN Take 1 tablet (500 mg total) by mouth 2 (two) times daily.   apixaban 2.5 MG Tabs tablet Commonly known as: ELIQUIS Take 1 tablet (2.5 mg total) by mouth 2 (two) times daily.   furosemide 20 MG tablet Commonly known as: LASIX Take 1 tablet (20 mg total) by mouth daily as needed for fluid or edema (and shortness of breath). What changed:   when to take this  reasons to take this   melatonin 5 MG Tabs Take 5 mg by mouth at bedtime.   metoprolol succinate 50 MG 24 hr tablet Commonly known as: TOPROL-XL Take 1 tablet by mouth once daily   Multi Complete/Iron Tabs Take 1 tablet by mouth daily.   omeprazole 20 MG capsule Commonly known as: PRILOSEC Take 20 mg by mouth daily.   senna 8.6 MG tablet Commonly known as: SENOKOT Take 1 tablet by mouth 2 (two) times daily.       Consultations:  None  Procedures/Studies:   CT CHEST W CONTRAST  Result Date: 10/14/2019 CLINICAL DATA:  Lethargy and cough EXAM: CT CHEST WITH CONTRAST TECHNIQUE: Multidetector CT imaging of the chest was performed during intravenous contrast administration. CONTRAST:  80m OMNIPAQUE IOHEXOL 300 MG/ML  SOLN COMPARISON:  Radiograph same day FINDINGS: Cardiovascular: Aortic valve calcifications and coronary artery calcifications are seen. There is scattered  aortic atherosclerosis noted. There is mild cardiomegaly. No pericardial effusion is seen. Mediastinum/Nodes: There are no enlarged mediastinal, hilar or axillary lymph nodes. Fluid is seen in a mildly dilated esophagus throughout. There is a small hiatal hernia present. Lungs/Pleura: Rounded patchy airspace consolidation seen within the bilateral upper lobes, left greater than right. There is small tree-in-bud opacity seen in the left upper lung. A moderate right and small left pleural effusion is present. There is basilar probable atelectasis at the left lung base. Upper abdomen: The visualized portion of the upper abdomen is unremarkable. Musculoskeletal/Chest wall: There is no chest wall mass or suspicious osseous finding. No acute osseous abnormality IMPRESSION: Multifocal patchy rounded airspace opacities, most notable within the left upper lung which is likely due to multifocal pneumonia. However would recommend Followup PA and lateral chest X-ray is recommended in 3-4 weeks following trial of antibiotic therapy to ensure resolution and exclude underlying malignancy. Moderate right and small left pleural effusion Patulous distended fluid-filled esophagus with a small hiatal hernia Aortic Atherosclerosis (ICD10-I70.0). Electronically Signed   By: BPrudencio PairM.D.   On: 10/14/2019 20:36   DG Chest Port 1 View  Result Date: 10/14/2019 CLINICAL DATA:  Lethargy and cough. EXAM: PORTABLE CHEST 1 VIEW COMPARISON:  March 05, 2019 FINDINGS: Mildly decreased lung volumes  are seen which is likely, in part, secondary to the degree of patient inspiration. Mild, diffuse chronic appearing increased lung markings are seen with very mild atelectasis noted within the bilateral lung bases. There is a small left pleural effusion. No pneumothorax is identified. The heart size and mediastinal contours are within normal limits. There is moderate severity calcification of the aortic arch. Moderate severity levoscoliosis of  the lower thoracic spine is noted. IMPRESSION: 1. Mild, diffuse chronic appearing increased lung markings with very mild bibasilar atelectasis. 2. Small left pleural effusion. Electronically Signed   By: Virgina Norfolk M.D.   On: 10/14/2019 16:18       The results of significant diagnostics from this hospitalization (including imaging, microbiology, ancillary and laboratory) are listed below for reference.     Microbiology: Recent Results (from the past 240 hour(s))  SARS Coronavirus 2 by RT PCR (hospital order, performed in [facility] hospital lab) Nasopharyngeal Nasopharyngeal Swab     Status: None   Collection Time: 10/14/19  7:03 PM   Specimen: Nasopharyngeal Swab  Result Value Ref Range Status   SARS Coronavirus 2 NEGATIVE NEGATIVE Final    Comment: (NOTE) SARS-CoV-2 target nucleic acids are NOT DETECTED.  The SARS-CoV-2 RNA is generally detectable in upper and lower respiratory specimens during the acute phase of infection. The lowest concentration of SARS-CoV-2 viral copies this assay can detect is 250 copies / mL. A negative result does not preclude SARS-CoV-2 infection and should not be used as the sole basis for treatment or other patient management decisions.  A negative result may occur with improper specimen collection / handling, submission of specimen other than nasopharyngeal swab, presence of viral mutation(s) within the areas targeted by this assay, and inadequate number of viral copies (<250 copies / mL). A negative result must be combined with clinical observations, patient history, and epidemiological information.  Fact Sheet for Patients:   StrictlyIdeas.no  Fact Sheet for Healthcare Providers: BankingDealers.co.za  This test is not yet approved or  cleared by the [place] FDA and has been authorized for detection and/or diagnosis of SARS-CoV-2 by FDA under an Emergency Use Authorization (EUA).  This  EUA will remain in effect (meaning this test can be used) for the duration of the COVID-19 declaration under Section 564(b)(1) of the Act, 21 U.S.C. section 360bbb-3(b)(1), unless the authorization is terminated or revoked sooner.  Performed at [facility], [place] [address] [place], [place] [address]   Culture, blood (routine x 2)     Status: None (Preliminary result)   Collection Time: 10/14/19  7:38 PM   Specimen: BLOOD  Result Value Ref Range Status   Specimen Description   Final    BLOOD BLOOD RIGHT WRIST Performed at [facility] [address], [place], [place] [address]    Special Requests   Final    BOTTLES DRAWN AEROBIC AND ANAEROBIC Blood Culture adequate volume Performed at [facility] [address], [place], [place] [address]    Culture   Final    NO GROWTH 3 DAYS Performed at [facility], [place] [address], [place], [place] [address]    Report Status PENDING  Incomplete  Culture, Urine     Status: None   Collection Time: 10/14/19  7:41 PM   Specimen: Urine, Random  Result Value Ref Range Status   Specimen Description   Final    URINE, RANDOM Performed at [facility] [address] [place],  [place] [address]    Special Requests   Final    NONE Performed at [facility], [place] [address], [place], [place] [address]    Culture   Final    NO GROWTH Performed at [facility], [place] [address] [place], [place] [address]    Report Status 10/15/2019 FINAL  Final  Culture, blood (routine x 2)     Status: None (Preliminary result)   Collection Time: 10/14/19 10:35 PM   Specimen: BLOOD  Result Value Ref Range Status   Specimen Description   Final    BLOOD BLOOD RIGHT ARM Performed at [facility] [address], [place], [place] [address]    Special Requests   Final    BOTTLES DRAWN AEROBIC AND ANAEROBIC Blood Culture adequate  volume Performed at [facility] [address], [place], [place] [address]    Culture  Setup Time   Final    GRAM POSITIVE COCCI IN CLUSTERS AEROBIC BOTTLE ONLY CRITICAL RESULT CALLED TO, READ BACK BY AND VERIFIED WITH: PHARMD G. GADHI [phone] [id number] FCP    Culture   Final    GRAM POSITIVE COCCI IDENTIFICATION TO FOLLOW Performed at [facility], [place] [address] [place], [place] [address]    Report Status PENDING  Incomplete  Blood Culture ID Panel (Reflexed)     Status: Abnormal   Collection Time: 10/14/19 10:35 PM  Result Value Ref Range Status   Enterococcus faecalis NOT DETECTED NOT DETECTED Final   Enterococcus Faecium NOT DETECTED NOT DETECTED Final   Listeria monocytogenes NOT DETECTED NOT DETECTED Final   Staphylococcus species DETECTED (A) NOT DETECTED Final    Comment: CRITICAL RESULT CALLED TO, READ BACK BY AND VERIFIED WITH: PHARMD G. GADHI [phone] FCP    Staphylococcus aureus (BCID) NOT DETECTED NOT DETECTED Final   Staphylococcus epidermidis NOT DETECTED NOT DETECTED Final   Staphylococcus lugdunensis NOT DETECTED NOT DETECTED Final   Streptococcus species NOT DETECTED NOT DETECTED Final   Streptococcus agalactiae NOT DETECTED NOT DETECTED Final   Streptococcus pneumoniae NOT DETECTED NOT DETECTED Final   Streptococcus pyogenes NOT DETECTED NOT DETECTED Final   A.calcoaceticus-baumannii NOT DETECTED NOT DETECTED Final   Bacteroides fragilis NOT DETECTED NOT DETECTED Final   Enterobacterales NOT DETECTED NOT DETECTED Final   Enterobacter cloacae complex NOT DETECTED NOT DETECTED Final   Escherichia coli NOT DETECTED NOT DETECTED Final   Klebsiella aerogenes NOT DETECTED NOT DETECTED Final   Klebsiella oxytoca NOT DETECTED NOT DETECTED Final   Klebsiella pneumoniae NOT DETECTED NOT DETECTED Final   Proteus species NOT DETECTED NOT DETECTED Final   Salmonella species NOT DETECTED NOT DETECTED Final   Serratia marcescens NOT DETECTED NOT  DETECTED Final   Haemophilus influenzae NOT DETECTED NOT DETECTED Final   Neisseria meningitidis NOT DETECTED NOT DETECTED Final   Pseudomonas aeruginosa NOT DETECTED NOT DETECTED Final   Stenotrophomonas maltophilia NOT DETECTED NOT DETECTED Final   Candida albicans NOT DETECTED NOT DETECTED Final   Candida auris NOT DETECTED NOT DETECTED Final   Candida glabrata NOT DETECTED NOT DETECTED Final   Candida krusei NOT DETECTED NOT DETECTED Final   Candida parapsilosis NOT DETECTED NOT DETECTED Final   Candida tropicalis NOT DETECTED NOT DETECTED Final   Cryptococcus neoformans/gattii NOT DETECTED NOT DETECTED Final    Comment: Performed at [facility], [address], [place], [place] [address]     Labs: BNP (last 3 results) Recent Labs  03/05/19 1514 10/14/19 1545  BNP 440.7* 612.2*   Basic Metabolic Panel: Recent Labs  Lab 10/14/19 1545 10/16/19 0550  NA 131* 132*  K 4.1 3.9  CL 94* 101  CO2 25 24  GLUCOSE 116* 99  BUN 13 14  CREATININE 0.82 0.62  CALCIUM 8.8* 8.3*  PHOS  --  2.5   Liver Function Tests: Recent Labs  Lab 10/14/19 1545 10/16/19 0550  AST 21  --   ALT 15  --   ALKPHOS 88  --   BILITOT 0.6  --   PROT 6.3*  --   ALBUMIN 2.4* 2.0*   No results for input(s): LIPASE, AMYLASE in the last 168 hours. No results for input(s): AMMONIA in the last 168 hours. CBC: Recent Labs  Lab 10/14/19 1545 10/15/19 0630 10/16/19 1507  WBC 13.8* 12.3* 8.6  NEUTROABS 11.5* 9.0*  --   HGB 12.4 12.2 9.8*  HCT 40.2 40.1 32.2*  MCV 89.7 90.5 91.5  PLT 415* 549* 199   Cardiac Enzymes: No results for input(s): CKTOTAL, CKMB, CKMBINDEX, TROPONINI in the last 168 hours. BNP: Invalid input(s): POCBNP CBG: No results for input(s): GLUCAP in the last 168 hours. D-Dimer No results for input(s): DDIMER in the last 72 hours. Hgb A1c No results for input(s): HGBA1C in the last 72 hours. Lipid Profile No results for input(s): CHOL, HDL, LDLCALC, TRIG, CHOLHDL,  LDLDIRECT in the last 72 hours. Thyroid function studies No results for input(s): TSH, T4TOTAL, T3FREE, THYROIDAB in the last 72 hours.  Invalid input(s): FREET3 Anemia work up No results for input(s): VITAMINB12, FOLATE, FERRITIN, TIBC, IRON, RETICCTPCT in the last 72 hours. Urinalysis    Component Value Date/Time   COLORURINE AMBER (A) 10/14/2019 1657   APPEARANCEUR HAZY (A) 10/14/2019 1657   LABSPEC 1.019 10/14/2019 1657   PHURINE 5.0 10/14/2019 1657   GLUCOSEU NEGATIVE 10/14/2019 1657   HGBUR SMALL (A) 10/14/2019 1657   BILIRUBINUR NEGATIVE 10/14/2019 1657   KETONESUR 5 (A) 10/14/2019 1657   PROTEINUR NEGATIVE 10/14/2019 1657   UROBILINOGEN 0.2 12/09/2013 2007   NITRITE NEGATIVE 10/14/2019 1657   LEUKOCYTESUR LARGE (A) 10/14/2019 1657   Sepsis Labs Invalid input(s): PROCALCITONIN,  WBC,  LACTICIDVEN   Time coordinating discharge: 35 minutes  SIGNED:  Mercy Riding, MD  [facility] 10/17/2019, 11:08 AM  If 7PM-7AM, please contact night-coverage [email or web address]

## 2019-10-17 NOTE — Care Management Important Message (Signed)
Important Message  Patient Details IM Letter given to the Patient Name: [name] MRN: [id number] Date of Birth: 18-Jul-1920   Medicare Important Message Given:  Yes     Kerin Salen 10/17/2019, 12:22 PM

## 2019-10-17 NOTE — TOC Transition Note (Signed)
Transition of Care [facility]) - CM/SW Discharge Note   Patient Details  Name: [name] MRN: [id number] Date of Birth: 11-27-1920  Transition of Care [facility]) CM/SW Contact:  Ross Ludwig, LCSW Phone Number: 10/17/2019, 11:56 AM   Clinical Narrative:     Patient is a LTC resident at [facility].  CSW spoke to patient's daughter, plan is for patient to return back to [facility].  CSW spoke to facility and they are able to accept patient back today.  Patient to be d/c'ed today to [facility] room 1206P.  Patient and family agreeable to plans will transport via ems RN to call report [phone].  Patient's daughter was informed that patient is discharging today.  Final next level of care: [place] Barriers to Discharge: Barriers Resolved   Patient Goals and CMS Choice Patient states their goals for this hospitalization and ongoing recovery are:: To return back to [facility] where she is LTC resident CMS Medicare.gov Compare Post Acute Care list provided to:: Patient Represenative (must comment) Choice offered to / list presented to : Adult Children  Discharge Placement   Existing PASRR number confirmed : 10/17/19          Patient chooses bed at: [facility] Patient to be transferred to facility by: [facility] Name of family member notified: Daughter [name] [phone] Patient and family notified of of transfer: 10/17/19  Discharge Plan and Services                  DME Agency: NA                  Social Determinants of Health ([place]) Interventions     Readmission Risk Interventions No flowsheet data found.

## 2019-10-17 NOTE — Progress Notes (Signed)
Report given to [facility]

## 2019-10-17 NOTE — NC FL2 (Signed)
[place] LEVEL OF CARE SCREENING TOOL     IDENTIFICATION  Patient Name: [name] Birthdate: [birth year] Sex: female Admission Date (Current Location): 10/14/2019  [facility] and [place] Number:  Herbalist and Address:  [facility],  [place], [place]      Provider Number: [id number]  Attending Physician Name and Address:  Mercy Riding, MD  Relative Name and Phone Number:  Morton,Cindy Daughter [phone]  [phone]    Current Level of Care: Hospital Recommended Level of Care: [place] Prior Approval Number:    Date Approved/Denied:   PASRR Number: [id number]  Discharge Plan: SNF    Current Diagnoses: Patient Active Problem List   Diagnosis Date Noted  . Severe sepsis ([place]) 10/15/2019  . Essential hypertension 10/14/2019  . GERD without esophagitis 10/14/2019  . Pneumonia of both lower lobes due to infectious organism 10/14/2019  . Lactic acidosis 10/14/2019  . Acute cystitis without hematuria 10/14/2019  . Acute respiratory failure with hypoxia ([place]) 10/14/2019  . DNR (do not resuscitate) 03/06/2019  . Generalized weakness 03/05/2019  . Permanent atrial fibrillation ([place]) 10/27/2018  . LVH (left ventricular hypertrophy) 10/27/2018  . 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, estrogen receptor positive ([place]) 05/23/2017    Orientation RESPIRATION BLADDER Height & Weight     Self, Time, Place  Normal Incontinent Weight: 139 lb 5.3 oz (63.2 kg) Height:  5\' 5"  (165.1 cm)  BEHAVIORAL SYMPTOMS/MOOD NEUROLOGICAL BOWEL NUTRITION STATUS      Continent Diet  AMBULATORY STATUS COMMUNICATION OF NEEDS Skin   Limited Assist Verbally Normal                       Personal Care Assistance Level of Assistance  Bathing, Feeding, Dressing Bathing Assistance: Limited assistance Feeding assistance: Limited assistance Dressing Assistance: Limited assistance      Functional Limitations Info  Sight, Speech, Hearing Sight Info: Adequate Hearing Info: Adequate Speech Info: Adequate    SPECIAL CARE FACTORS FREQUENCY                       Contractures Contractures Info: Not present    Additional Factors Info  Code Status, Allergies Code Status Info: DNR Allergies Info: Codeine           Current Medications (10/17/2019):  This is the current hospital active medication list Current Facility-Administered Medications  Medication Dose Route Frequency Provider Last Rate Last Admin  . acetaminophen (TYLENOL) tablet 650 mg  650 mg Oral Q6H PRN Vernelle Emerald, MD   650 mg at 10/14/19 2316   Or  . acetaminophen (TYLENOL) suppository 650 mg  650 mg Rectal Q6H PRN Vernelle Emerald, MD      . amLODipine (NORVASC) tablet 5 mg  5 mg Oral Daily Wendee Beavers T, MD   5 mg at 10/16/19 0924  . amoxicillin-clavulanate (AUGMENTIN) 500-125 MG per tablet 500 mg  1 tablet Oral BID Wendee Beavers T, MD   500 mg at 10/16/19 2231  . apixaban (ELIQUIS) tablet 2.5 mg  2.5 mg Oral BID Vernelle Emerald, MD   2.5 mg at 10/16/19 2231  . azithromycin (ZITHROMAX) tablet 250 mg  250 mg Oral Daily Wendee Beavers T, MD   250 mg at 10/16/19 0919  . melatonin tablet 5 mg  5 mg Oral QHS Shalhoub, Sherryll Burger, MD   5 mg at 10/16/19 2231  . metoprolol  succinate (TOPROL-XL) 24 hr tablet 50 mg  50 mg Oral Daily Shalhoub, Sherryll Burger, MD   50 mg at 10/16/19 0923  . multivitamin with minerals tablet 1 tablet  1 tablet Oral Daily Shalhoub, Sherryll Burger, MD   1 tablet at 10/16/19 [phone]  . ondansetron (ZOFRAN) tablet 4 mg  4 mg Oral Q6H PRN Shalhoub, Sherryll Burger, MD       Or  . ondansetron [facility]) injection 4 mg  4 mg Intravenous Q6H PRN Shalhoub, Sherryll Burger, MD      . pantoprazole (PROTONIX) EC tablet 40 mg  40 mg Oral Daily Shalhoub, Sherryll Burger, MD   40 mg at 10/16/19 0923  . polyethylene glycol (MIRALAX / GLYCOLAX) packet 17 g  17 g Oral Daily PRN Shalhoub, Sherryll Burger, MD      . sodium chloride  flush (NS) 0.9 % injection 10-40 mL  10-40 mL Intracatheter Q12H Wendee Beavers T, MD   10 mL at 10/16/19 0924  . sodium chloride flush (NS) 0.9 % injection 10-40 mL  10-40 mL Intracatheter PRN Mercy Riding, MD         Discharge Medications: Please see discharge summary for a list of discharge medications.  Relevant Imaging Results:  Relevant Lab Results:   Additional Information SSN [id number]  Ross Ludwig, LCSW

## 2019-10-18 LAB — CULTURE, BLOOD (ROUTINE X 2): Special Requests: ADEQUATE

## 2019-10-19 LAB — CULTURE, BLOOD (ROUTINE X 2)
Culture: NO GROWTH
Special Requests: ADEQUATE

## 2019-11-18 ENCOUNTER — Emergency Department (HOSPITAL_COMMUNITY): Payer: Medicare Other

## 2019-11-18 ENCOUNTER — Other Ambulatory Visit: Payer: Self-pay

## 2019-11-18 ENCOUNTER — Inpatient Hospital Stay (HOSPITAL_COMMUNITY)
Admission: EM | Admit: 2019-11-18 | Discharge: 2019-12-15 | DRG: 193 | Disposition: E | Payer: Medicare Other | Attending: Internal Medicine | Admitting: Internal Medicine

## 2019-11-18 DIAGNOSIS — R627 Adult failure to thrive: Secondary | ICD-10-CM | POA: Diagnosis present

## 2019-11-18 DIAGNOSIS — I10 Essential (primary) hypertension: Secondary | ICD-10-CM | POA: Diagnosis present

## 2019-11-18 DIAGNOSIS — Y828 Other medical devices associated with adverse incidents: Secondary | ICD-10-CM | POA: Diagnosis not present

## 2019-11-18 DIAGNOSIS — Z170 Estrogen receptor positive status [ER+]: Secondary | ICD-10-CM

## 2019-11-18 DIAGNOSIS — K219 Gastro-esophageal reflux disease without esophagitis: Secondary | ICD-10-CM | POA: Diagnosis present

## 2019-11-18 DIAGNOSIS — Z7189 Other specified counseling: Secondary | ICD-10-CM

## 2019-11-18 DIAGNOSIS — Z923 Personal history of irradiation: Secondary | ICD-10-CM

## 2019-11-18 DIAGNOSIS — T8089XA Other complications following infusion, transfusion and therapeutic injection, initial encounter: Secondary | ICD-10-CM | POA: Diagnosis not present

## 2019-11-18 DIAGNOSIS — Z853 Personal history of malignant neoplasm of breast: Secondary | ICD-10-CM

## 2019-11-18 DIAGNOSIS — Z66 Do not resuscitate: Secondary | ICD-10-CM | POA: Diagnosis present

## 2019-11-18 DIAGNOSIS — Z8616 Personal history of COVID-19: Secondary | ICD-10-CM

## 2019-11-18 DIAGNOSIS — Z9181 History of falling: Secondary | ICD-10-CM

## 2019-11-18 DIAGNOSIS — K922 Gastrointestinal hemorrhage, unspecified: Secondary | ICD-10-CM | POA: Diagnosis present

## 2019-11-18 DIAGNOSIS — R4182 Altered mental status, unspecified: Secondary | ICD-10-CM | POA: Diagnosis not present

## 2019-11-18 DIAGNOSIS — I4821 Permanent atrial fibrillation: Secondary | ICD-10-CM | POA: Diagnosis present

## 2019-11-18 DIAGNOSIS — J90 Pleural effusion, not elsewhere classified: Secondary | ICD-10-CM | POA: Diagnosis present

## 2019-11-18 DIAGNOSIS — Z515 Encounter for palliative care: Secondary | ICD-10-CM

## 2019-11-18 DIAGNOSIS — G9341 Metabolic encephalopathy: Secondary | ICD-10-CM | POA: Diagnosis present

## 2019-11-18 DIAGNOSIS — Z90710 Acquired absence of both cervix and uterus: Secondary | ICD-10-CM

## 2019-11-18 DIAGNOSIS — L8990 Pressure ulcer of unspecified site, unspecified stage: Secondary | ICD-10-CM | POA: Insufficient documentation

## 2019-11-18 DIAGNOSIS — E876 Hypokalemia: Secondary | ICD-10-CM | POA: Diagnosis not present

## 2019-11-18 DIAGNOSIS — D649 Anemia, unspecified: Secondary | ICD-10-CM

## 2019-11-18 DIAGNOSIS — E8809 Other disorders of plasma-protein metabolism, not elsewhere classified: Secondary | ICD-10-CM | POA: Diagnosis present

## 2019-11-18 DIAGNOSIS — Z79899 Other long term (current) drug therapy: Secondary | ICD-10-CM

## 2019-11-18 DIAGNOSIS — L89629 Pressure ulcer of left heel, unspecified stage: Secondary | ICD-10-CM | POA: Diagnosis present

## 2019-11-18 DIAGNOSIS — D6489 Other specified anemias: Secondary | ICD-10-CM | POA: Diagnosis present

## 2019-11-18 DIAGNOSIS — J189 Pneumonia, unspecified organism: Principal | ICD-10-CM | POA: Diagnosis present

## 2019-11-18 DIAGNOSIS — Z7901 Long term (current) use of anticoagulants: Secondary | ICD-10-CM

## 2019-11-18 DIAGNOSIS — R54 Age-related physical debility: Secondary | ICD-10-CM | POA: Diagnosis present

## 2019-11-18 LAB — I-STAT VENOUS BLOOD GAS, ED
Acid-Base Excess: 15 mmol/L — ABNORMAL HIGH (ref 0.0–2.0)
Bicarbonate: 40.5 mmol/L — ABNORMAL HIGH (ref 20.0–28.0)
Calcium, Ion: 1.12 mmol/L — ABNORMAL LOW (ref 1.15–1.40)
HCT: 32 % — ABNORMAL LOW (ref 36.0–46.0)
Hemoglobin: 10.9 g/dL — ABNORMAL LOW (ref 12.0–15.0)
O2 Saturation: 69 %
Potassium: 2.6 mmol/L — CL (ref 3.5–5.1)
Sodium: 133 mmol/L — ABNORMAL LOW (ref 135–145)
TCO2: 42 mmol/L — ABNORMAL HIGH (ref 22–32)
pCO2, Ven: 54.9 mmHg (ref 44.0–60.0)
pH, Ven: 7.476 — ABNORMAL HIGH (ref 7.250–7.430)
pO2, Ven: 35 mmHg (ref 32.0–45.0)

## 2019-11-18 LAB — COMPREHENSIVE METABOLIC PANEL
ALT: 15 U/L (ref 0–44)
AST: 26 U/L (ref 15–41)
Albumin: 1.9 g/dL — ABNORMAL LOW (ref 3.5–5.0)
Alkaline Phosphatase: 59 U/L (ref 38–126)
Anion gap: 10 (ref 5–15)
BUN: 18 mg/dL (ref 8–23)
CO2: 38 mmol/L — ABNORMAL HIGH (ref 22–32)
Calcium: 8.8 mg/dL — ABNORMAL LOW (ref 8.9–10.3)
Chloride: 86 mmol/L — ABNORMAL LOW (ref 98–111)
Creatinine, Ser: 0.74 mg/dL (ref 0.44–1.00)
GFR calc non Af Amer: >60 mL/min (ref >60)
Glucose, Bld: 129 mg/dL — ABNORMAL HIGH (ref 70–99)
Potassium: 2.4 mmol/L — CL (ref 3.5–5.1)
Sodium: 134 mmol/L — ABNORMAL LOW (ref 135–145)
Total Bilirubin: 0.8 mg/dL (ref 0.3–1.2)
Total Protein: 5.5 g/dL — ABNORMAL LOW (ref 6.5–8.1)

## 2019-11-18 LAB — CBC WITH DIFFERENTIAL/PLATELET
Abs Immature Granulocytes: 0.11 10*3/uL — ABNORMAL HIGH (ref 0.00–0.07)
Basophils Absolute: 0 10*3/uL (ref 0.0–0.1)
Basophils Relative: 0 %
Eosinophils Absolute: 0 10*3/uL (ref 0.0–0.5)
Eosinophils Relative: 0 %
HCT: 32.4 % — ABNORMAL LOW (ref 36.0–46.0)
Hemoglobin: 10 g/dL — ABNORMAL LOW (ref 12.0–15.0)
Immature Granulocytes: 1 %
Lymphocytes Relative: 11 %
Lymphs Abs: 1.2 10*3/uL (ref 0.7–4.0)
MCH: 27.3 pg (ref 26.0–34.0)
MCHC: 30.9 g/dL (ref 30.0–36.0)
MCV: 88.5 fL (ref 80.0–100.0)
Monocytes Absolute: 1.1 10*3/uL — ABNORMAL HIGH (ref 0.1–1.0)
Monocytes Relative: 10 %
Neutro Abs: 8.3 10*3/uL — ABNORMAL HIGH (ref 1.7–7.7)
Neutrophils Relative %: 78 %
Platelets: 349 10*3/uL (ref 150–400)
RBC: 3.66 MIL/uL — ABNORMAL LOW (ref 3.87–5.11)
RDW: 17.7 % — ABNORMAL HIGH (ref 11.5–15.5)
WBC: 10.7 10*3/uL — ABNORMAL HIGH (ref 4.0–10.5)
nRBC: 0 % (ref 0.0–0.2)

## 2019-11-18 LAB — FERRITIN: Ferritin: 49 ng/mL (ref 11–307)

## 2019-11-18 LAB — APTT: aPTT: 23 seconds — ABNORMAL LOW (ref 24–36)

## 2019-11-18 LAB — MAGNESIUM: Magnesium: 1.3 mg/dL — ABNORMAL LOW (ref 1.7–2.4)

## 2019-11-18 LAB — ABO/RH: ABO/RH(D): O POS

## 2019-11-18 LAB — URINALYSIS, ROUTINE W REFLEX MICROSCOPIC
Bilirubin Urine: NEGATIVE
Glucose, UA: NEGATIVE mg/dL
Hgb urine dipstick: NEGATIVE
Ketones, ur: 5 mg/dL — AB
Nitrite: NEGATIVE
Protein, ur: NEGATIVE mg/dL
Specific Gravity, Urine: 1.016 (ref 1.005–1.030)
pH: 5 (ref 5.0–8.0)

## 2019-11-18 LAB — LACTIC ACID, PLASMA
Lactic Acid, Venous: 2.8 mmol/L (ref 0.5–1.9)
Lactic Acid, Venous: 2.6 mmol/L (ref 0.5–1.9)

## 2019-11-18 LAB — RESP PANEL BY RT PCR (RSV, FLU A&B, COVID)
Influenza A by PCR: NEGATIVE
Influenza B by PCR: NEGATIVE
Respiratory Syncytial Virus by PCR: NEGATIVE
SARS Coronavirus 2 by RT PCR: NEGATIVE

## 2019-11-18 LAB — RETICULOCYTES
Immature Retic Fract: 17.7 % — ABNORMAL HIGH (ref 2.3–15.9)
RBC.: 3.61 MIL/uL — ABNORMAL LOW (ref 3.87–5.11)
Retic Count, Absolute: 69.3 10*3/uL (ref 19.0–186.0)
Retic Ct Pct: 1.9 % (ref 0.4–3.1)

## 2019-11-18 LAB — TSH: TSH: 4.672 u[IU]/mL — ABNORMAL HIGH (ref 0.350–4.500)

## 2019-11-18 LAB — FOLATE: Folate: 13.4 ng/mL (ref >5.9)

## 2019-11-18 LAB — POC OCCULT BLOOD, ED: Fecal Occult Bld: NEGATIVE

## 2019-11-18 LAB — TYPE AND SCREEN
ABO/RH(D): O POS
Antibody Screen: NEGATIVE

## 2019-11-18 LAB — TROPONIN I (HIGH SENSITIVITY)
Troponin I (High Sensitivity): 20 ng/L — ABNORMAL HIGH (ref <18)
Troponin I (High Sensitivity): 21 ng/L — ABNORMAL HIGH (ref <18)

## 2019-11-18 LAB — IRON AND TIBC
Iron: 31 ug/dL (ref 28–170)
Saturation Ratios: 15 % (ref 10.4–31.8)
TIBC: 204 ug/dL — ABNORMAL LOW (ref 250–450)
UIBC: 173 ug/dL

## 2019-11-18 LAB — PROTIME-INR
INR: 1.6 — ABNORMAL HIGH (ref 0.8–1.2)
Prothrombin Time: 18.5 seconds — ABNORMAL HIGH (ref 11.4–15.2)

## 2019-11-18 LAB — LIPASE, BLOOD: Lipase: 22 U/L (ref 11–51)

## 2019-11-18 LAB — VITAMIN B12: Vitamin B-12: 686 pg/mL (ref 180–914)

## 2019-11-18 LAB — T4, FREE: Free T4: 1.2 ng/dL — ABNORMAL HIGH (ref 0.61–1.12)

## 2019-11-18 MED ORDER — APIXABAN 2.5 MG PO TABS
2.5000 mg | ORAL_TABLET | Freq: Two times a day (BID) | ORAL | Status: DC
  Filled 2019-11-18: qty 1

## 2019-11-18 MED ORDER — MAGNESIUM SULFATE 2 GM/50ML IV SOLN
2.0000 g | Freq: Once | INTRAVENOUS | Status: AC
  Administered 2019-11-18: 2 g via INTRAVENOUS
  Filled 2019-11-18: qty 50

## 2019-11-18 MED ORDER — METOPROLOL SUCCINATE ER 50 MG PO TB24
50.0000 mg | ORAL_TABLET | Freq: Every day | ORAL | Status: DC
  Administered 2019-11-20: 50 mg via ORAL
  Filled 2019-11-18 (×2): qty 1

## 2019-11-18 MED ORDER — SODIUM CHLORIDE 0.9 % IV BOLUS
500.0000 mL | Freq: Once | INTRAVENOUS | Status: AC
  Administered 2019-11-18: 500 mL via INTRAVENOUS

## 2019-11-18 MED ORDER — ACETAMINOPHEN 325 MG PO TABS: 650.0000 mg | ORAL_TABLET | Freq: Four times a day (QID) | ORAL | Status: DC | PRN

## 2019-11-18 MED ORDER — PANTOPRAZOLE SODIUM 40 MG IV SOLR
40.0000 mg | INTRAVENOUS | Status: DC
  Administered 2019-11-18: 40 mg via INTRAVENOUS

## 2019-11-18 MED ORDER — POTASSIUM CHLORIDE 10 MEQ/100ML IV SOLN
10.0000 meq | INTRAVENOUS | Status: AC
  Administered 2019-11-18 (×3): 10 meq via INTRAVENOUS
  Filled 2019-11-18 (×3): qty 100

## 2019-11-18 MED ORDER — PANTOPRAZOLE SODIUM 40 MG IV SOLR
40.0000 mg | Freq: Once | INTRAVENOUS | Status: AC
  Administered 2019-11-18: 40 mg via INTRAVENOUS
  Filled 2019-11-18: qty 40

## 2019-11-18 MED ORDER — CALCIUM CARBONATE ANTACID 500 MG PO CHEW
2.0000 | CHEWABLE_TABLET | Freq: Every day | ORAL | Status: DC
  Administered 2019-11-20: 400 mg via ORAL
  Filled 2019-11-18 (×2): qty 2

## 2019-11-18 NOTE — ED Notes (Signed)
Attempted to call report , receiving RN to call back.

## 2019-11-18 NOTE — ED Notes (Addendum)
EDP informed of pt's 2.4 K+ & lactic of 2.8.

## 2019-11-18 NOTE — ED Triage Notes (Signed)
Pt from [facility] via ems; pt normally alert, able to hold conversation; staff endorses 1 episode of black and clear emesis; denies dark/bloody stools; hypotensive on ems arrival 103/76; afib, rate of 100; sats 95% 3L, which she normally wears; left arm restricted

## 2019-11-18 NOTE — ED Notes (Signed)
Potassium 2.4  °

## 2019-11-18 NOTE — ED Notes (Signed)
Patient transported to CT 

## 2019-11-18 NOTE — ED Provider Notes (Signed)
[facility] EMERGENCY DEPARTMENT Provider Note   CSN: [id number] Arrival date & time: 11/30/2019  1403     History Chief Complaint  Patient presents with  . Altered Mental Status    [name] is a 86 y.o. female.  HPI   86 year old female with a history of breast cancer, arthritis, Covid, GERD, hypertension, UTI, who presents to the emergency department for evaluation of altered mental status and vomiting.  Per EMS report patient coming from [facility] place.  She is normally able to hold a conversation and is alert.  She had an episode of black and clear emesis this morning therefore family is concerned and wanted her to come to the emergency department.  She had borderline BPs on arrival with systolic BP 101, patient in A. fib with a rate of 100 and sats at 95% on 3 L which she is on at baseline.  Level 5 caveat due to patient being unable to provide any significant history.  3:27 PM Spoke with nursing staff at pts facility. They state that this AM pt started vomiting (black mucous). Did not note any blood in stool. Pt was recently d/c from the hospital 1 month ago. She stayed in the covid unit for 2 weeks and came back to the normal area of the facility 2 weeks ago. Nursing staff states since she has been back from her admission they have noticed she has declined a lot.    Past Medical History:  Diagnosis Date  . Arthritis   . Breast cancer ([place])   . Chronic back pain   . COVID-19 virus infection 03/05/2019  . GERD (gastroesophageal reflux disease)   . GERD without esophagitis 10/14/2019  . History of radiation therapy 08/10/17- 09/07/17   Left Breast 28.50 Gy in 5 fractions given on a weekly basis.   . Hypertension   . UTI (urinary tract infection)     Patient Active Problem List   Diagnosis Date Noted  . Severe sepsis ([place]) 10/15/2019  . Essential hypertension 10/14/2019  . GERD without esophagitis 10/14/2019  . Pneumonia of both lower lobes due to  infectious organism 10/14/2019  . Lactic acidosis 10/14/2019  . Acute cystitis without hematuria 10/14/2019  . Acute respiratory failure with hypoxia ([place]) 10/14/2019  . DNR (do not resuscitate) 03/06/2019  . Generalized weakness 03/05/2019  . Permanent atrial fibrillation ([place]) 10/27/2018  . LVH (left ventricular hypertrophy) 10/27/2018  . 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 ABDOMINAL HYSTERECTOMY     she still has one ovary in place.   Marland Kitchen BREAST LUMPECTOMY    . BREAST LUMPECTOMY WITH RADIOACTIVE SEED LOCALIZATION Left 06/22/2017   Procedure: BREAST LUMPECTOMY WITH RADIOACTIVE SEED LOCALIZATION;  Surgeon: [name], MD;  Location: [facility];  Service: General;  Laterality: Left;  . CHOLECYSTECTOMY    . HERNIA REPAIR       OB History   No obstetric history on file.     Family History  Problem Relation Age of Onset  . Stroke Mother 10    Social History   Tobacco Use  . Smoking status: Never Smoker  . Smokeless tobacco: Never Used  Vaping Use  . Vaping Use: Never used  Substance Use Topics  . Alcohol use: No  . Drug use: No    Home Medications Prior to Admission medications   Medication Sig Start Date  End Date Taking? Authorizing Provider  acetaminophen (TYLENOL) 325 MG tablet Take 2 tablets (650 mg total) by mouth every 6 (six) hours as needed for mild pain (or Fever >/= 101). 03/10/19  Yes Elgergawy, Silver Huguenin, MD  Amino Acids-Protein Hydrolys (FEEDING SUPPLEMENT, PRO-STAT SUGAR FREE 64,) LIQD Take 30 mLs by mouth 2 (two) times daily.   Yes [provider]  apixaban (ELIQUIS) 2.5 MG TABS tablet Take 1 tablet (2.5 mg total) by mouth 2 (two) times daily. 03/10/19  Yes Elgergawy, Silver Huguenin, MD  calcium carbonate (TUMS - DOSED IN MG ELEMENTAL CALCIUM) 500 MG chewable tablet Chew 2 tablets by mouth daily.   Yes [provider]  furosemide (LASIX) 20 MG tablet Take 1 tablet (20 mg total) by mouth daily as needed for fluid or edema (and shortness of breath). 10/17/19  Yes Mercy Riding, MD  LACTOBACILLUS PROBIOTIC PO Take 2 capsules by mouth 2 (two) times daily.   Yes [provider]  levofloxacin (LEVAQUIN) 750 MG tablet Take 750 mg by mouth daily. 11/08/19  Yes [provider]  melatonin 5 MG TABS Take 5 mg by mouth at bedtime.   Yes [provider]  metolazone (ZAROXOLYN) 5 MG tablet Take 5 mg by mouth daily. 11/10/19  Yes [provider]  metoprolol succinate (TOPROL-XL) 50 MG 24 hr tablet Take 1 tablet by mouth once daily Patient taking differently: Take 50 mg by mouth daily.  12/24/18  Yes Minus Breeding, MD  Multiple Vitamins-Minerals (MULTI COMPLETE/IRON) TABS Take 1 tablet by mouth daily.   Yes [provider]  nutrition supplement, JUVEN, (JUVEN) PACK Take 1 packet by mouth daily.   Yes [provider]  omeprazole (PRILOSEC) 20 MG capsule Take 20 mg by mouth daily.   Yes [provider]  senna (SENOKOT) 8.6 MG tablet Take 1 tablet by mouth 2 (two) times daily.    Yes [provider]    Allergies    Codeine  Review of Systems   Review of Systems  Unable to perform ROS: Mental status change  Gastrointestinal: Positive for vomiting.    Physical Exam Updated Vital Signs BP 109/76   Pulse 92   Temp 97.6 F (36.4 C) (Axillary)   Resp (!) 24   SpO2 90%   Physical Exam Vitals and nursing note reviewed.  Constitutional:      General: She is not in acute distress.    Appearance: She is well-developed.     Comments: Chronically ill appearing  HENT:     Head: Normocephalic and atraumatic.     Mouth/Throat:     Mouth: Mucous membranes are dry.  Eyes:     Conjunctiva/sclera: Conjunctivae normal.  Cardiovascular:     Heart sounds: No murmur heard.      Comments: Irregularly irregualr Pulmonary:     Effort:  Pulmonary effort is normal. No respiratory distress.     Breath sounds: Normal breath sounds.  Abdominal:     General: Bowel sounds are normal.     Palpations: Abdomen is soft.     Tenderness: There is no abdominal tenderness. There is no guarding or rebound.  Musculoskeletal:     Cervical back: Neck supple.  Skin:    General: Skin is warm and dry.  Neurological:     Mental Status: She is alert.     ED Results / Procedures / Treatments   Labs (all labs ordered are listed, but only abnormal results are displayed) Labs Reviewed  CBC  WITH DIFFERENTIAL/PLATELET - Abnormal; Notable for the following components:      Result Value   WBC 10.7 (*)    RBC 3.66 (*)    Hemoglobin 10.0 (*)    HCT 32.4 (*)    RDW 17.7 (*)    Neutro Abs 8.3 (*)    Monocytes Absolute 1.1 (*)    Abs Immature Granulocytes 0.11 (*)    All other components within normal limits  COMPREHENSIVE METABOLIC PANEL - Abnormal; Notable for the following components:   Sodium 134 (*)    Potassium 2.4 (*)    Chloride 86 (*)    CO2 38 (*)    Glucose, Bld 129 (*)    Calcium 8.8 (*)    Total Protein 5.5 (*)    Albumin 1.9 (*)    All other components within normal limits  LACTIC ACID, PLASMA - Abnormal; Notable for the following components:   Lactic Acid, Venous 2.8 (*)    All other components within normal limits  TROPONIN I (HIGH SENSITIVITY) - Abnormal; Notable for the following components:   Troponin I (High Sensitivity) 20 (*)    All other components within normal limits  RESP PANEL BY RT PCR (RSV, FLU A&B, COVID)  LIPASE, BLOOD  URINALYSIS, ROUTINE W REFLEX MICROSCOPIC  LACTIC ACID, PLASMA  MAGNESIUM  PROTIME-INR  APTT  POC OCCULT BLOOD, ED  I-STAT VENOUS BLOOD GAS, ED  TROPONIN I (HIGH SENSITIVITY)    EKG EKG Interpretation  Date/Time:  Tuesday November 18 2019 15:17:37 EDT Ventricular Rate:  100 PR Interval:    QRS Duration: 78 QT Interval:  347 QTC Calculation: 448 R Axis:   61 Text  Interpretation: Atrial fibrillation Low voltage, extremity and precordial leads Anteroseptal infarct, old Borderline repolarization abnormality Confirmed by Gerlene Fee [id number]) on 12/14/2019 3:35:41 PM   Radiology CT Head Wo Contrast  Result Date: 11/29/2019 CLINICAL DATA:  Delirium altered EXAM: CT HEAD WITHOUT CONTRAST TECHNIQUE: Contiguous axial images were obtained from the base of the skull through the vertex without intravenous contrast. COMPARISON:  CT brain 03/22/2019 FINDINGS: Brain: No acute territorial infarction, hemorrhage, or intracranial mass. Chronic left cerebellar infarct. Chronic appearing infarct within the left frontal lobe white matter but new since February 2021. Moderate atrophy. Moderate hypodensity in the white matter consistent with chronic small vessel ischemic change. Stable ventricle size. Vascular: No hyperdense vessels.  Carotid vascular calcification Skull: Normal. Negative for fracture or focal lesion. Sinuses/Orbits: No acute finding. Other: None IMPRESSION: 1. No CT evidence for acute intracranial abnormality. 2. Atrophy and chronic small vessel ischemic changes of the white matter. Chronic left cerebellar infarct. Chronic appearing left frontal lobe infarct but new since February 2021. Electronically Signed   By: Donavan Foil M.D.   On: 11/19/2019 16:14   DG Chest Portable 1 View  Result Date: 12/13/2019 CLINICAL DATA:  Altered mental status. EXAM: PORTABLE CHEST 1 VIEW COMPARISON:  10/14/2019 FINDINGS: The cardiac silhouette, mediastinal and hilar contours are within normal limits and stable given the patient's age. There is moderate tortuosity and calcification of the thoracic aorta. Persistent moderate-sized bilateral pleural effusions, right greater than left with significant overlying atelectasis. No definite infiltrates or pneumothorax. IMPRESSION: Persistent moderate-sized bilateral pleural effusions with significant overlying atelectasis. Electronically Signed    By: Marijo Sanes M.D.   On: 11/25/2019 15:13    Procedures Procedures (including critical care time)  Medications Ordered in ED Medications  potassium chloride 10 mEq in 100 mL IVPB (10 mEq Intravenous New Bag/Given (Non-Interop)  11/22/2019 1707)  magnesium sulfate IVPB 2 g 50 mL (2 g Intravenous New Bag/Given (Non-Interop) 12/02/2019 1707)  sodium chloride 0.9 % bolus 500 mL (0 mLs Intravenous Stopped 12/10/2019 1643)  sodium chloride 0.9 % bolus 500 mL (500 mLs Intravenous New Bag/Given (Non-Interop) 12/12/2019 1706)  pantoprazole (PROTONIX) injection 40 mg (40 mg Intravenous Given 12/03/2019 1708)    ED Course  I have reviewed the triage vital signs and the nursing notes.  Pertinent labs & imaging results that were available during my care of the patient were reviewed by me and considered in my medical decision making (see chart for details).    MDM Rules/Calculators/A&P                          86 year old female presenting to the emergency department today for evaluation of altered mental status.  Also had an episode of black/clear emesis as her facility prior to arrival.  Reviewed/interpreted lab CBC with mild leukocytosis, mild anemia stable from prior  CMP with hypokalemia, low na, cl, elevated co2, normal renal and liver fxn.   - iv k given, mg added and mg given in ed Lipase neg Trop marginally elevated, likely demand Lactic acid is elevated, ivf given UA pending on admission COVID pending on admission Hemoccult negative - no melena on exam  EKG with NSR, afib, low voltage extremity and precordial leads, old anteroseptal infarct, borderline repol  CT head neg for bleeding   Pt with dark vomiting noted at facility, hemoccult neg. Started on protonix. abd soft and nontender. Pt denies pain. Low potassium. Started IV k. No prolonged qtc on ekg  5:17 PM CONSULT with Dr. Posey Pronto who accepts patient for admission   Final Clinical Impression(s) / ED Diagnoses Final diagnoses:    Hypokalemia    Rx / DC Orders ED Discharge Orders    None       Rodney Booze, PA-C 11/26/2019 1724    Maudie Flakes, MD 11/20/19 2254

## 2019-11-18 NOTE — H&P (Signed)
History and Physical    [name] XBJ:[id number] DOB: 30-Sep-1920 DOA: 12/09/2019  PCP: Lawerance Cruel, MD    Patient coming from: [facility]   Chief Complaint: Altered mental status/Vomiting.   HPI: [name] is a 86 y.o. female with medical history significant of HTN,UTI, brought to er for AMS.   Pt has has black vomiting today am per ed provider note .Pt also had Covid and recovered well and went back to facility. Pt given levaquin suspect for PNA/ UTI. Granddaughter at bedside gives history and pt has been weak and has been  Having black mucus per staff at facility.Pt had bloodwork done and did not receive call for results.  Today pt is alert and awake but limited speech. HPI is primarily by GD.    ED Course:  Blood pressure 106/74, pulse 92, temperature 97.6 F (36.4 C), temperature source Axillary, resp. rate (!) 23, SpO2 94 %. SpO2: 94 % O2 Flow Rate (L/min): 3 L/min Labs show:Fecal Occult negative /Hypokalemia with potassium of 2.4 and hypoalbuminemia of 1.9 and elevated lactic of 2.8. CBC shows decreasing hemoglobin of 10 and hb on September  2 was 9.8 and 9/1 her hemoglobin was 12.2. Potassium replaced. Covid-19 is pending.In ed pt is tachycardic and tachypnea only intermittently.  Xray is negative for pna but has bl moderate  pleural effusion with atelectasis.opt is on eliquis for her a/fib.; Hemoglobin has gone down 2 gm and pt has been having what sounds like GIB. And also she has a MOST form  and wants nonaggressive medical t/t. Pt's prognosis is guarded due to her poor nutrition and frail status.    Review of Systems: As per HPI otherwise all systems reviewed and negative.  Past Medical History:  Diagnosis Date  . Arthritis   . Breast cancer ([place])   . Chronic back pain   . COVID-19 virus infection 03/05/2019  . GERD (gastroesophageal reflux disease)   . GERD without esophagitis 10/14/2019  . History of radiation therapy 08/10/17- 09/07/17    Left Breast 28.50 Gy in 5 fractions given on a weekly basis.   . Hypertension   . UTI (urinary tract infection)     Past Surgical History:  Procedure Laterality Date  . ABDOMINAL HYSTERECTOMY     she still has one ovary in place.   Marland Kitchen BREAST LUMPECTOMY    . BREAST LUMPECTOMY WITH RADIOACTIVE SEED LOCALIZATION Left 06/22/2017   Procedure: BREAST LUMPECTOMY WITH RADIOACTIVE SEED LOCALIZATION;  Surgeon: [name], MD;  Location: [facility];  Service: General;  Laterality: Left;  . CHOLECYSTECTOMY    . HERNIA REPAIR       reports that she has never smoked. She has never used smokeless tobacco. She reports that she does not drink alcohol and does not use drugs.  Allergies  Allergen Reactions  . Codeine Nausea And Vomiting    Family History  Problem Relation Age of Onset  . Stroke Mother 59    Prior to Admission medications   Medication Sig Start Date End Date Taking? Authorizing Provider  acetaminophen (TYLENOL) 325 MG tablet Take 2 tablets (650 mg total) by mouth every 6 (six) hours as needed for mild pain (or Fever >/= 101). 03/10/19  Yes Elgergawy, Silver Huguenin, MD  Amino Acids-Protein Hydrolys (FEEDING SUPPLEMENT, PRO-STAT SUGAR FREE 64,) LIQD Take 30 mLs by mouth 2 (two) times daily.   Yes [provider]  apixaban (ELIQUIS) 2.5 MG TABS tablet Take 1 tablet (2.5 mg total)  by mouth 2 (two) times daily. 03/10/19  Yes Elgergawy, Silver Huguenin, MD  calcium carbonate (TUMS - DOSED IN MG ELEMENTAL CALCIUM) 500 MG chewable tablet Chew 2 tablets by mouth daily.   Yes [provider]  furosemide (LASIX) 20 MG tablet Take 1 tablet (20 mg total) by mouth daily as needed for fluid or edema (and shortness of breath). 10/17/19  Yes Mercy Riding, MD  LACTOBACILLUS PROBIOTIC PO Take 2 capsules by mouth 2 (two) times daily.   Yes [provider]  levofloxacin (LEVAQUIN) 750 MG tablet Take 750 mg by mouth daily. 11/08/19  Yes [provider]  melatonin 5 MG  TABS Take 5 mg by mouth at bedtime.   Yes [provider]  metolazone (ZAROXOLYN) 5 MG tablet Take 5 mg by mouth daily. 11/10/19  Yes [provider]  metoprolol succinate (TOPROL-XL) 50 MG 24 hr tablet Take 1 tablet by mouth once daily Patient taking differently: Take 50 mg by mouth daily.  12/24/18  Yes Minus Breeding, MD  Multiple Vitamins-Minerals (MULTI COMPLETE/IRON) TABS Take 1 tablet by mouth daily.   Yes [provider]  nutrition supplement, JUVEN, (JUVEN) PACK Take 1 packet by mouth daily.   Yes [provider]  omeprazole (PRILOSEC) 20 MG capsule Take 20 mg by mouth daily.   Yes [provider]  senna (SENOKOT) 8.6 MG tablet Take 1 tablet by mouth 2 (two) times daily.    Yes [provider]    Physical Exam: Vitals:   12/07/2019 1530 11/20/2019 1600 11/26/2019 1700 12/02/2019 1800  BP: 108/73 118/67 109/76 106/74  Pulse:  92  92  Resp: (!) 24 (!) 24 (!) 24 (!) 23  Temp:      TempSrc:      SpO2:  90%  94%    Constitutional: NAD, calm, comfortable Vitals:   11/22/2019 1530 11/28/2019 1600 12/03/2019 1700 11/28/2019 1800  BP: 108/73 118/67 109/76 106/74  Pulse:  92  92  Resp: (!) 24 (!) 24 (!) 24 (!) 23  Temp:      TempSrc:      SpO2:  90%  94%   Eyes: EOMI lids and conjunctivae normal ENMT: Mucous membranes are moist. Posterior pharynx clear of any exudate or lesions.Normal dentition.  Neck: normal, supple, no masses, no thyromegaly, no carotid bruit  Respiratory: clear to auscultation bilaterally, no wheezing, no crackles. Normal respiratory effort. No accessory muscle use.  Cardiovascular: Regular rate and rhythm, no murmurs / rubs / gallops. No extremity edema. 2+ pedal pulses. No carotid bruits.  Abdomen: no tenderness, no masses palpated. No hepatosplenomegaly. Bowel sounds positive.  Musculoskeletal: no clubbing / cyanosis. No joint deformity upper and lower extremities. Pt moving all four ext , no contractures. Normal muscle  tone.  Skin: no rashes, lesions, ulcers. No induration Neurologic: CN 2-12 grossly intact. Strength 5/5 in all 4.  Psychiatric: Normal judgment and insight. Alert and oriented x 3. Normal mood.   Labs on Admission: I have personally reviewed following labs and imaging studies  CBC: Recent Labs  Lab 11/20/2019 1450  WBC 10.7*  NEUTROABS 8.3*  HGB 10.0*  HCT 32.4*  MCV 88.5  PLT 160   Basic Metabolic Panel: Recent Labs  Lab 11/20/2019 1450 11/25/2019 1744  NA 134*  --   K 2.4*  --   CL 86*  --   CO2 38*  --   GLUCOSE 129*  --   BUN 18  --   CREATININE 0.74  --  CALCIUM 8.8*  --   MG  --  1.3*   GFR: CrCl cannot be calculated (Unknown ideal weight.). Liver Function Tests: Recent Labs  Lab 11/22/2019 1450  AST 26  ALT 15  ALKPHOS 59  BILITOT 0.8  PROT 5.5*  ALBUMIN 1.9*   Recent Labs  Lab 12/05/2019 1450  LIPASE 22   No results for input(s): AMMONIA in the last 168 hours. Coagulation Profile: Recent Labs  Lab 12/02/2019 1744  INR 1.6*   Cardiac Enzymes: No results for input(s): CKTOTAL, CKMB, CKMBINDEX, TROPONINI in the last 168 hours. BNP (last 3 results) No results for input(s): PROBNP in the last 8760 hours. HbA1C: No results for input(s): HGBA1C in the last 72 hours. CBG: No results for input(s): GLUCAP in the last 168 hours. Lipid Profile: No results for input(s): CHOL, HDL, LDLCALC, TRIG, CHOLHDL, LDLDIRECT in the last 72 hours. Thyroid Function Tests: No results for input(s): TSH, T4TOTAL, FREET4, T3FREE, THYROIDAB in the last 72 hours. Anemia Panel: No results for input(s): VITAMINB12, FOLATE, FERRITIN, TIBC, IRON, RETICCTPCT in the last 72 hours. Urine analysis:    Component Value Date/Time   COLORURINE AMBER (A) 10/14/2019 1657   APPEARANCEUR HAZY (A) 10/14/2019 1657   LABSPEC 1.019 10/14/2019 1657   PHURINE 5.0 10/14/2019 1657   GLUCOSEU NEGATIVE 10/14/2019 1657   HGBUR SMALL (A) 10/14/2019 1657   BILIRUBINUR NEGATIVE 10/14/2019 1657    KETONESUR 5 (A) 10/14/2019 1657   PROTEINUR NEGATIVE 10/14/2019 1657   UROBILINOGEN 0.2 12/09/2013 2007   NITRITE NEGATIVE 10/14/2019 1657   LEUKOCYTESUR LARGE (A) 10/14/2019 1657   No intake or output data in the 24 hours ending 11/17/2019 1842 Lab Results  Component Value Date   CREATININE 0.74 12/11/2019   CREATININE 0.62 10/16/2019   CREATININE 0.82 10/14/2019    COVID-19 Labs  No results for input(s): DDIMER, FERRITIN, LDH, CRP in the last 72 hours.  Lab Results  Component Value Date   SARSCOV2NAA NEGATIVE 12/05/2019   [place] NEGATIVE 10/14/2019   SARSCOV2NAA POSITIVE (A) 03/01/2019    Radiological Exams on Admission: CT Head Wo Contrast  Result Date: 12/02/2019 CLINICAL DATA:  Delirium altered EXAM: CT HEAD WITHOUT CONTRAST TECHNIQUE: Contiguous axial images were obtained from the base of the skull through the vertex without intravenous contrast. COMPARISON:  CT brain 03/22/2019 FINDINGS: Brain: No acute territorial infarction, hemorrhage, or intracranial mass. Chronic left cerebellar infarct. Chronic appearing infarct within the left frontal lobe white matter but new since February 2021. Moderate atrophy. Moderate hypodensity in the white matter consistent with chronic small vessel ischemic change. Stable ventricle size. Vascular: No hyperdense vessels.  Carotid vascular calcification Skull: Normal. Negative for fracture or focal lesion. Sinuses/Orbits: No acute finding. Other: None  IMPRESSION: 1. No CT evidence for acute intracranial abnormality. 2. Atrophy and chronic small vessel ischemic changes of the white matter. Chronic left cerebellar infarct. Chronic appearing left frontal lobe infarct but new since February 2021. Electronically Signed   By: Donavan Foil M.D.   On: 11/22/2019 16:14   DG Chest Portable 1 View  Result Date: 11/16/2019 CLINICAL DATA:  Altered mental status. EXAM: PORTABLE CHEST 1 VIEW COMPARISON:  10/14/2019 FINDINGS: The cardiac silhouette,  mediastinal and hilar contours are within normal limits and stable given the patient's age. There is moderate tortuosity and calcification of the thoracic aorta. Persistent moderate-sized bilateral pleural effusions, right greater than left with significant overlying atelectasis. No definite infiltrates or pneumothorax.  IMPRESSION: Persistent moderate-sized bilateral pleural effusions with significant overlying atelectasis.  Electronically Signed   By: Marijo Sanes M.D.   On: 11/17/2019 15:13    EKG: Independently reviewed.  A.fib at 100 and pvc on telemetry.  Assessment/Plan Principal Problem:   Hypokalemia Active Problems:   Permanent atrial fibrillation (HCC)   DNR (do not resuscitate)   Essential hypertension   Hypokalemia: Pt given 3 rounds of IV potassium magnesium level is pending . attribute to diuretic therapy and poor po intake.  Hold pt's diuretic therapy.    C/H A.fib: Cont eliquis low dose and monitor cbc and follow anemia. D/w GD about risk of bleeding vs stroke.  DNR with MOST form  HTN; Continued metoprolol.  Anemia: anemia panel. attribute to c./h gib from eliquis.    DVT prophylaxis:  scd'd  Code Status:  DNR with limited intervention.    Family Communication:  Roderic Scarce [phone].  Disposition Plan:  SNF  Consults called:  None   Admission status: Observation Status is: Observation  The patient remains OBS appropriate and will d/c before 2 midnights.  Dispo: The patient is from: SNF              Anticipated d/c is to: SNF              Anticipated d/c date is: 2 days              Patient currently is not medically stable to d/c. Para Skeans MD [facility] Pager [phone] If 7PM-7AM, please contact night-coverage [email or web address] Password [email or web address] 12/11/2019, 6:42 PM

## 2019-11-18 NOTE — ED Notes (Signed)
Attempted to call report, RN to call back.

## 2019-11-19 DIAGNOSIS — Z66 Do not resuscitate: Secondary | ICD-10-CM | POA: Diagnosis present

## 2019-11-19 DIAGNOSIS — E8809 Other disorders of plasma-protein metabolism, not elsewhere classified: Secondary | ICD-10-CM | POA: Diagnosis present

## 2019-11-19 DIAGNOSIS — Z90710 Acquired absence of both cervix and uterus: Secondary | ICD-10-CM | POA: Diagnosis not present

## 2019-11-19 DIAGNOSIS — Z170 Estrogen receptor positive status [ER+]: Secondary | ICD-10-CM | POA: Diagnosis not present

## 2019-11-19 DIAGNOSIS — G9341 Metabolic encephalopathy: Secondary | ICD-10-CM | POA: Diagnosis present

## 2019-11-19 DIAGNOSIS — L89629 Pressure ulcer of left heel, unspecified stage: Secondary | ICD-10-CM | POA: Diagnosis present

## 2019-11-19 DIAGNOSIS — I4821 Permanent atrial fibrillation: Secondary | ICD-10-CM | POA: Diagnosis present

## 2019-11-19 DIAGNOSIS — Z8616 Personal history of COVID-19: Secondary | ICD-10-CM | POA: Diagnosis not present

## 2019-11-19 DIAGNOSIS — Z853 Personal history of malignant neoplasm of breast: Secondary | ICD-10-CM | POA: Diagnosis not present

## 2019-11-19 DIAGNOSIS — E876 Hypokalemia: Secondary | ICD-10-CM | POA: Diagnosis present

## 2019-11-19 DIAGNOSIS — Y828 Other medical devices associated with adverse incidents: Secondary | ICD-10-CM | POA: Diagnosis not present

## 2019-11-19 DIAGNOSIS — K922 Gastrointestinal hemorrhage, unspecified: Secondary | ICD-10-CM | POA: Diagnosis present

## 2019-11-19 DIAGNOSIS — Z7189 Other specified counseling: Secondary | ICD-10-CM | POA: Diagnosis not present

## 2019-11-19 DIAGNOSIS — J90 Pleural effusion, not elsewhere classified: Secondary | ICD-10-CM | POA: Diagnosis present

## 2019-11-19 DIAGNOSIS — D6489 Other specified anemias: Secondary | ICD-10-CM | POA: Diagnosis present

## 2019-11-19 DIAGNOSIS — R627 Adult failure to thrive: Secondary | ICD-10-CM | POA: Diagnosis present

## 2019-11-19 DIAGNOSIS — Z7901 Long term (current) use of anticoagulants: Secondary | ICD-10-CM | POA: Diagnosis not present

## 2019-11-19 DIAGNOSIS — I10 Essential (primary) hypertension: Secondary | ICD-10-CM | POA: Diagnosis present

## 2019-11-19 DIAGNOSIS — J189 Pneumonia, unspecified organism: Secondary | ICD-10-CM | POA: Diagnosis present

## 2019-11-19 DIAGNOSIS — Z923 Personal history of irradiation: Secondary | ICD-10-CM | POA: Diagnosis not present

## 2019-11-19 DIAGNOSIS — R4182 Altered mental status, unspecified: Secondary | ICD-10-CM | POA: Diagnosis present

## 2019-11-19 DIAGNOSIS — Z515 Encounter for palliative care: Secondary | ICD-10-CM | POA: Diagnosis not present

## 2019-11-19 DIAGNOSIS — T8089XA Other complications following infusion, transfusion and therapeutic injection, initial encounter: Secondary | ICD-10-CM | POA: Diagnosis not present

## 2019-11-19 DIAGNOSIS — Z79899 Other long term (current) drug therapy: Secondary | ICD-10-CM | POA: Diagnosis not present

## 2019-11-19 DIAGNOSIS — L8990 Pressure ulcer of unspecified site, unspecified stage: Secondary | ICD-10-CM | POA: Insufficient documentation

## 2019-11-19 DIAGNOSIS — K219 Gastro-esophageal reflux disease without esophagitis: Secondary | ICD-10-CM | POA: Diagnosis present

## 2019-11-19 DIAGNOSIS — R54 Age-related physical debility: Secondary | ICD-10-CM | POA: Diagnosis present

## 2019-11-19 DIAGNOSIS — Z9181 History of falling: Secondary | ICD-10-CM | POA: Diagnosis not present

## 2019-11-19 LAB — CBC WITH DIFFERENTIAL/PLATELET
Abs Immature Granulocytes: 0.06 10*3/uL (ref 0.00–0.07)
Basophils Absolute: 0 10*3/uL (ref 0.0–0.1)
Basophils Relative: 0 %
Eosinophils Absolute: 0 10*3/uL (ref 0.0–0.5)
Eosinophils Relative: 0 %
HCT: 28.6 % — ABNORMAL LOW (ref 36.0–46.0)
Hemoglobin: 8.6 g/dL — ABNORMAL LOW (ref 12.0–15.0)
Immature Granulocytes: 1 %
Lymphocytes Relative: 15 %
Lymphs Abs: 1.5 10*3/uL (ref 0.7–4.0)
MCH: 27 pg (ref 26.0–34.0)
MCHC: 30.1 g/dL (ref 30.0–36.0)
MCV: 89.9 fL (ref 80.0–100.0)
Monocytes Absolute: 1.2 10*3/uL — ABNORMAL HIGH (ref 0.1–1.0)
Monocytes Relative: 12 %
Neutro Abs: 7.2 10*3/uL (ref 1.7–7.7)
Neutrophils Relative %: 72 %
Platelets: 284 10*3/uL (ref 150–400)
RBC: 3.18 MIL/uL — ABNORMAL LOW (ref 3.87–5.11)
RDW: 17.8 % — ABNORMAL HIGH (ref 11.5–15.5)
WBC: 10 10*3/uL (ref 4.0–10.5)
nRBC: 0 % (ref 0.0–0.2)

## 2019-11-19 LAB — BASIC METABOLIC PANEL
Anion gap: 11 (ref 5–15)
BUN: 17 mg/dL (ref 8–23)
CO2: 30 mmol/L (ref 22–32)
Calcium: 8.3 mg/dL — ABNORMAL LOW (ref 8.9–10.3)
Chloride: 89 mmol/L — ABNORMAL LOW (ref 98–111)
Creatinine, Ser: 0.6 mg/dL (ref 0.44–1.00)
GFR calc non Af Amer: >60 mL/min (ref >60)
Glucose, Bld: 106 mg/dL — ABNORMAL HIGH (ref 70–99)
Potassium: 5.1 mmol/L (ref 3.5–5.1)
Sodium: 130 mmol/L — ABNORMAL LOW (ref 135–145)

## 2019-11-19 LAB — PHOSPHORUS: Phosphorus: 2.6 mg/dL (ref 2.5–4.6)

## 2019-11-19 LAB — COMPREHENSIVE METABOLIC PANEL
ALT: 14 U/L (ref 0–44)
AST: 23 U/L (ref 15–41)
Albumin: 1.7 g/dL — ABNORMAL LOW (ref 3.5–5.0)
Alkaline Phosphatase: 49 U/L (ref 38–126)
Anion gap: 11 (ref 5–15)
BUN: 16 mg/dL (ref 8–23)
CO2: 34 mmol/L — ABNORMAL HIGH (ref 22–32)
Calcium: 8.4 mg/dL — ABNORMAL LOW (ref 8.9–10.3)
Chloride: 88 mmol/L — ABNORMAL LOW (ref 98–111)
Creatinine, Ser: 0.58 mg/dL (ref 0.44–1.00)
GFR calc non Af Amer: >60 mL/min (ref >60)
Glucose, Bld: 115 mg/dL — ABNORMAL HIGH (ref 70–99)
Potassium: 2.7 mmol/L — CL (ref 3.5–5.1)
Sodium: 133 mmol/L — ABNORMAL LOW (ref 135–145)
Total Bilirubin: 1 mg/dL (ref 0.3–1.2)
Total Protein: 4.8 g/dL — ABNORMAL LOW (ref 6.5–8.1)

## 2019-11-19 LAB — LACTIC ACID, PLASMA
Lactic Acid, Venous: 1.9 mmol/L (ref 0.5–1.9)
Lactic Acid, Venous: 2.5 mmol/L (ref 0.5–1.9)

## 2019-11-19 LAB — MAGNESIUM: Magnesium: 1.7 mg/dL (ref 1.7–2.4)

## 2019-11-19 LAB — POTASSIUM: Potassium: 2.7 mmol/L — CL (ref 3.5–5.1)

## 2019-11-19 MED ORDER — MAGNESIUM SULFATE 2 GM/50ML IV SOLN
2.0000 g | Freq: Once | INTRAVENOUS | Status: AC
  Administered 2019-11-19: 2 g via INTRAVENOUS
  Filled 2019-11-19 (×2): qty 50

## 2019-11-19 MED ORDER — SODIUM CHLORIDE 0.9 % IV SOLN
1.0000 g | INTRAVENOUS | Status: DC
  Administered 2019-11-19 – 2019-11-20 (×2): 1 g via INTRAVENOUS
  Filled 2019-11-19 (×3): qty 10

## 2019-11-19 MED ORDER — DEXTROSE-NACL 5-0.9 % IV SOLN: INTRAVENOUS | Status: DC

## 2019-11-19 MED ORDER — PANTOPRAZOLE SODIUM 40 MG IV SOLR
40.0000 mg | Freq: Two times a day (BID) | INTRAVENOUS | Status: DC
  Administered 2019-11-19 – 2019-11-20 (×3): 40 mg via INTRAVENOUS
  Filled 2019-11-19 (×3): qty 40

## 2019-11-19 MED ORDER — ENSURE ENLIVE PO LIQD
237.0000 mL | Freq: Two times a day (BID) | ORAL | Status: DC
  Administered 2019-11-20 (×2): 237 mL via ORAL

## 2019-11-19 MED ORDER — POTASSIUM CHLORIDE 10 MEQ/100ML IV SOLN
10.0000 meq | INTRAVENOUS | Status: AC
  Administered 2019-11-19 (×6): 10 meq via INTRAVENOUS
  Filled 2019-11-19 (×5): qty 100

## 2019-11-19 NOTE — Progress Notes (Signed)
Patients grand daughter [name] requests that the doctors, specifically palliative care team call her, so that she can be present for the meeting. Her number is in the chart.

## 2019-11-19 NOTE — Progress Notes (Addendum)
PROGRESS NOTE    [name]  VOZ:[id number] DOB: [birth year] DOA: 12/11/2019 PCP: Lawerance Cruel, MD   Brief Narrative: 86 year old with past medical history significant for hypertension, UTI, brought to the ER for altered mental status.  Per history patient has had vomiting black fluid content.  Patient also had coughing and recovered well and went back to facility. Patient was given Levaquin for possible pneumonia/UTI.  Evaluation in the ED blood pressure 106/74, temperature lower 97, oxygen sat 94 on 3 L of oxygen Bilateral pleural effusion and atelectasis.  Assessment & Plan:   Principal Problem:   Hypokalemia Active Problems:   Permanent atrial fibrillation (HCC)   DNR (do not resuscitate)   Essential hypertension   Anemia   Pressure injury of skin  Hypokalemia; Repleted IV.  Replace magnesium as well Likely due to poor oral intake.  Hold diuretics  A. fib: Continue to monitor hemoglobin Hold Eliquis hemoglobin further dropped to 8  Hypertension: Continue with metoprolol  Anemia, vomiting black content episode; Continue with IV Protonix twice daily Continue to monitor hemoglobin  Pna pleural effusion; Start ceftriaxone.   Acute metabolic encephalopathy;  Could be related to antibiotics, levaquin , FTT  FTT; Hypoalbuminemia;  Poor oral intake.  Poor prognosis if she does not improve or start eating.  Palliative care consulted.   Pressure Injury 12/05/2019 Heel Left Deep Tissue Pressure Injury - Purple or maroon localized area of discolored intact skin or blood-filled blister due to damage of underlying soft tissue from pressure and/or shear. (Active)  11/22/2019 2200  Location: Heel  Location Orientation: Left  Staging: Deep Tissue Pressure Injury - Purple or maroon localized area of discolored intact skin or blood-filled blister due to damage of underlying soft tissue from pressure and/or shear.  Wound Description (Comments):   Present on Admission: Yes                   Estimated body mass index is 23.19 kg/m as calculated from the following:   Height as of 10/16/19: 5\' 5"  (1.651 m).   Weight as of 10/16/19: 63.2 kg.   DVT prophylaxis: SCD Code Status: DNR Family Communication:Daughter at bedside Disposition Plan:  Status is: Inpatient  Remains inpatient appropriate because:Ongoing diagnostic testing needed not appropriate for outpatient work up   Dispo: The patient is from: SNF              Anticipated d/c is to: to be determine              Anticipated d/c date is: 2 days              Patient currently is not medically stable to d/c.        Consultants:   Palliative care   Procedures:   none  Antimicrobials:    Subjective: Sleepy, would open eyes, said few words. Fall back sleep.   Objective: Vitals:   11/17/2019 2240 11/19/19 0255 11/19/19 0656 11/19/19 0937  BP: 99/67 102/68 109/66 107/63  Pulse: 87 84 95 88  Resp: 18 16 18 18   Temp: 97.7 F (36.5 C) (!) 97.4 F (36.3 C) (!) 97.5 F (36.4 C) 98.2 F (36.8 C)  TempSrc:  Oral Oral Oral  SpO2:  (!) 87% 96% 98%    Intake/Output Summary (Last 24 hours) at 11/19/2019 1555 Last data filed at 11/19/2019 1300 Gross per 24 hour  Intake 621.7 ml  Output 0 ml  Net 621.7 ml   There were no vitals filed  for this visit.  Examination:  General exam: Appears calm and comfortable  Respiratory system: Clear to auscultation. Respiratory effort normal. Cardiovascular system: S1 & S2 heard, RRR. No JVD, murmurs, rubs, gallops or clicks. No pedal edema. Gastrointestinal system: Abdomen is nondistended, soft and nontender. No organomegaly or masses felt. Normal bowel sounds heard. Central nervous system: Alert and oriented.  Extremities: trace edema Data Reviewed: I have personally reviewed following labs and imaging studies  CBC: Recent Labs  Lab 11/17/2019 1450 11/28/2019 1956 11/19/19 0227  WBC 10.7*  --  10.0  NEUTROABS 8.3*  --  7.2  HGB 10.0*  10.9* 8.6*  HCT 32.4* 32.0* 28.6*  MCV 88.5  --  89.9  PLT 349  --  818   Basic Metabolic Panel: Recent Labs  Lab 11/17/2019 1450 12/10/2019 1744 12/14/2019 1956 11/28/2019 2333 11/19/19 0227 11/19/19 1125  NA 134*  --  133*  --  133* 130*  K 2.4*  --  2.6* 2.7* 2.7* 5.1  CL 86*  --   --   --  88* 89*  CO2 38*  --   --   --  34* 30  GLUCOSE 129*  --   --   --  115* 106*  BUN 18  --   --   --  16 17  CREATININE 0.74  --   --   --  0.58 0.60  CALCIUM 8.8*  --   --   --  8.4* 8.3*  MG  --  1.3*  --   --  1.7  --   PHOS  --   --   --   --  2.6  --    GFR: CrCl cannot be calculated (Unknown ideal weight.). Liver Function Tests: Recent Labs  Lab 12/13/2019 1450 11/19/19 0227  AST 26 23  ALT 15 14  ALKPHOS 59 49  BILITOT 0.8 1.0  PROT 5.5* 4.8*  ALBUMIN 1.9* 1.7*   Recent Labs  Lab 12/10/2019 1450  LIPASE 22   No results for input(s): AMMONIA in the last 168 hours. Coagulation Profile: Recent Labs  Lab 12/10/2019 1744  INR 1.6*   Cardiac Enzymes: No results for input(s): CKTOTAL, CKMB, CKMBINDEX, TROPONINI in the last 168 hours. BNP (last 3 results) No results for input(s): PROBNP in the last 8760 hours. HbA1C: No results for input(s): HGBA1C in the last 72 hours. CBG: No results for input(s): GLUCAP in the last 168 hours. Lipid Profile: No results for input(s): CHOL, HDL, LDLCALC, TRIG, CHOLHDL, LDLDIRECT in the last 72 hours. Thyroid Function Tests: Recent Labs    11/24/2019 1948  TSH 4.672*  FREET4 1.20*   Anemia Panel: Recent Labs    11/17/2019 1944 12/08/2019 1948  VITAMINB12  --  686  FOLATE  --  13.4  FERRITIN  --  49  TIBC  --  204*  IRON  --  31  RETICCTPCT 1.9  --    Sepsis Labs: Recent Labs  Lab 11/17/2019 1451 11/22/2019 1943 11/22/2019 2333 11/19/19 0227  LATICACIDVEN 2.8* 2.6* 2.5* 1.9    Recent Results (from the past 240 hour(s))  Resp Panel by RT PCR (RSV, Flu A&B, Covid) - Nasopharyngeal Swab     Status: None   Collection Time: 11/16/2019   3:03 PM   Specimen: Nasopharyngeal Swab  Result Value Ref Range Status   SARS Coronavirus 2 by RT PCR NEGATIVE NEGATIVE Final    Comment: (NOTE) SARS-CoV-2 target nucleic acids are NOT DETECTED.  The SARS-CoV-2 RNA  is generally detectable in upper respiratoy specimens during the acute phase of infection. The lowest concentration of SARS-CoV-2 viral copies this assay can detect is 131 copies/mL. A negative result does not preclude SARS-Cov-2 infection and should not be used as the sole basis for treatment or other patient management decisions. A negative result may occur with  improper specimen collection/handling, submission of specimen other than nasopharyngeal swab, presence of viral mutation(s) within the areas targeted by this assay, and inadequate number of viral copies (<131 copies/mL). A negative result must be combined with clinical observations, patient history, and epidemiological information. The expected result is Negative.  Fact Sheet for Patients:  PinkCheek.be  Fact Sheet for Healthcare Providers:  GravelBags.it  This test is no t yet approved or cleared by the [place] FDA and  has been authorized for detection and/or diagnosis of SARS-CoV-2 by FDA under an Emergency Use Authorization (EUA). This EUA will remain  in effect (meaning this test can be used) for the duration of the COVID-19 declaration under Section 564(b)(1) of the Act, 21 U.S.C. section 360bbb-3(b)(1), unless the authorization is terminated or revoked sooner.     Influenza A by PCR NEGATIVE NEGATIVE Final   Influenza B by PCR NEGATIVE NEGATIVE Final    Comment: (NOTE) The Xpert Xpress SARS-CoV-2/FLU/RSV assay is intended as an aid in  the diagnosis of influenza from Nasopharyngeal swab specimens and  should not be used as a sole basis for treatment. Nasal washings and  aspirates are unacceptable for Xpert Xpress SARS-CoV-2/FLU/RSV    testing.  Fact Sheet for Patients: PinkCheek.be  Fact Sheet for Healthcare Providers: GravelBags.it  This test is not yet approved or cleared by the [place] FDA and  has been authorized for detection and/or diagnosis of SARS-CoV-2 by  FDA under an Emergency Use Authorization (EUA). This EUA will remain  in effect (meaning this test can be used) for the duration of the  Covid-19 declaration under Section 564(b)(1) of the Act, 21  U.S.C. section 360bbb-3(b)(1), unless the authorization is  terminated or revoked.    Respiratory Syncytial Virus by PCR NEGATIVE NEGATIVE Final    Comment: (NOTE) Fact Sheet for Patients: PinkCheek.be  Fact Sheet for Healthcare Providers: GravelBags.it  This test is not yet approved or cleared by the [place] FDA and  has been authorized for detection and/or diagnosis of SARS-CoV-2 by  FDA under an Emergency Use Authorization (EUA). This EUA will remain  in effect (meaning this test can be used) for the duration of the  COVID-19 declaration under Section 564(b)(1) of the Act, 21 U.S.C.  section 360bbb-3(b)(1), unless the authorization is terminated or  revoked. Performed at [facility], [place] [address], [place], [place] [address]          Radiology Studies: CT Head Wo Contrast  Result Date: 11/28/2019 CLINICAL DATA:  Delirium altered EXAM: CT HEAD WITHOUT CONTRAST TECHNIQUE: Contiguous axial images were obtained from the base of the skull through the vertex without intravenous contrast. COMPARISON:  CT brain 03/22/2019 FINDINGS: Brain: No acute territorial infarction, hemorrhage, or intracranial mass. Chronic left cerebellar infarct. Chronic appearing infarct within the left frontal lobe white matter but new since February 2021. Moderate atrophy. Moderate hypodensity in the white matter consistent with chronic small  vessel ischemic change. Stable ventricle size. Vascular: No hyperdense vessels.  Carotid vascular calcification Skull: Normal. Negative for fracture or focal lesion. Sinuses/Orbits: No acute finding. Other: None IMPRESSION: 1. No CT evidence for acute intracranial abnormality. 2. Atrophy  and chronic small vessel ischemic changes of the white matter. Chronic left cerebellar infarct. Chronic appearing left frontal lobe infarct but new since February 2021. Electronically Signed   By: Donavan Foil M.D.   On: 11/17/2019 16:14   DG Chest Portable 1 View  Result Date: 12/06/2019 CLINICAL DATA:  Altered mental status. EXAM: PORTABLE CHEST 1 VIEW COMPARISON:  10/14/2019 FINDINGS: The cardiac silhouette, mediastinal and hilar contours are within normal limits and stable given the patient's age. There is moderate tortuosity and calcification of the thoracic aorta. Persistent moderate-sized bilateral pleural effusions, right greater than left with significant overlying atelectasis. No definite infiltrates or pneumothorax. IMPRESSION: Persistent moderate-sized bilateral pleural effusions with significant overlying atelectasis. Electronically Signed   By: Marijo Sanes M.D.   On: 12/02/2019 15:13        Scheduled Meds: . apixaban  2.5 mg Oral BID  . calcium carbonate  2 tablet Oral Daily  . feeding supplement (ENSURE ENLIVE)  237 mL Oral BID BM  . metoprolol succinate  50 mg Oral Daily  . pantoprazole (PROTONIX) IV  40 mg Intravenous Q24H   Continuous Infusions: . magnesium sulfate bolus IVPB       LOS: 0 days    Time spent: 35 minutes.     Elmarie Shiley, MD [facility]   If 7PM-7AM, please contact night-coverage [email or web address]  11/19/2019, 3:55 PM

## 2019-11-19 NOTE — Progress Notes (Signed)
New Admission Note:   Arrival Method: Stretcher Mental Orientation: alert to self does not follow commands,or answer questions Telemetry: box 10 Assessment: Completed Skin: see flow sheet IV: RT hand NSL Pain: none Tubes: none Safety Measures: Safety Fall Prevention Plan has been discussed Admission: Completed [facility] [place] Orientation: Patient has been orientated to the room, unit and staff.  Family: no family at bedside  Orders have been reviewed and implemented. Will continue to monitor the patient. Call light has been placed within reach and bed alarm has been activated.   Rockie Neighbours BSN, RN Phone number: [phone]

## 2019-11-19 NOTE — TOC Initial Note (Signed)
Transition of Care [facility]) - Initial/Assessment Note    Patient Details  Name: [name] MRN: [id number] Date of Birth: [birth year]  Transition [facility] CM/SW Contact:    Sable Feil, LCSW Phone Number: 11/19/2019, 5:18 PM  Clinical Narrative:   CSW talked with patient's daughter Roderic Scarce and daughter-in-law Para Skeans at the bedside regarding patient's discharge disposition. Mrs. Aschenbrenner was in bed and stayed asleep during CSW's visit. Rip Harbour is a Corporate investment banker [name] explained that she is the 1st contact at [facility] due to her medical background. CSW advised that patient has Medicaid (they will supply CSW with patient's Medicaid number), and is currently LTC at [facility].   CSW advised that they would like patient assessed for Hospice services, and this was discussed.Family reported that they were pleased with the care while in rehab, but have concerns since patient has moved to LTC, especially since they are unable to visit ([facility] does outdoor visitation per family), as patient has been physically unable to go outdoors for visits. CSW mentioned PCS services and advised that this will be looked into to determine if this is an option for Ms. Satterly. Family advised that even if patient can get PCS services at home, it won't be 24/7 care, and they expressed understanding.     Per daughter, she and patient had COVID in February 2021 and she is still experiencing physical issues from having COVID. She added that because of her health issues, she would be unable to care for her mother at home. CSW also advised that patient's savings have gone to [place] and the funeral home. Ms. Linan receives her retirement, her deceased husband's retirement and her SSA and most of this goes to [place] per daughter.   Expected Discharge Plan: [place] (The family is leaning towards home with hospice care but are really undecided at this point.) Barriers to Discharge:  Continued Medical Work up   Patient Goals and CMS Choice Patient states their goals for this hospitalization and ongoing recovery are:: Family aware that patient is not doing well medically and want to use this time to make decisions regarding the most appropriate d/c plan CMS Medicare.gov Compare Post Acute Care list provided to:: Other (Comment Required) (Not provided at this time, as family undecided on d/c plan) Choice offered to / list presented to : Adult Children (Discharged options discussed with daughter and daughter-in-law at the bedside)  Expected Discharge Plan and Services Expected Discharge Plan: [place] (The family is leaning towards home with hospice care but are really undecided at this point.) In-house Referral: Clinical Social Work Discharge Planning Services: Other - See comment (PCS services)   Living arrangements for the past 2 months: [place] (Patient is LTC resident at [facility])                                      Prior Living Arrangements/Services Living arrangements for the past 2 months: [place] (Patient is LTC resident at [facility]) Lives with:: Facility Resident Patient language and need for interpreter reviewed:: No Do you feel safe going back to the place where you live?: No   Family indicated that they have not been pleased with her care since becoming LTC. They are undecided on what the final d/c plan will be at this time  Need for Family Participation in Patient  Care: Yes (Comment) Care giver support system in place?: Yes (comment) (Patient came from [facility])   Criminal Activity/Legal Involvement Pertinent to Current Situation/Hospitalization: No - Comment as needed  Activities of Daily Living   ADL Screening (condition at time of admission) Patient's cognitive ability adequate to safely complete daily activities?: No Is the patient deaf or have difficulty hearing?: No Does the  patient have difficulty seeing, even when wearing glasses/contacts?: No Does the patient have difficulty concentrating, remembering, or making decisions?: Yes Patient able to express need for assistance with ADLs?: No Does the patient have difficulty dressing or bathing?: Yes Independently performs ADLs?: No Communication: Dependent Is this a change from baseline?: Change from baseline, expected to last <3 days Dressing (OT): Dependent Is this a change from baseline?: Pre-admission baseline Grooming: Dependent Is this a change from baseline?: Pre-admission baseline Feeding: Dependent Is this a change from baseline?: Change from baseline, expected to last <3 days Bathing: Dependent Is this a change from baseline?: Pre-admission baseline Toileting: Dependent Is this a change from baseline?: Pre-admission baseline In/Out Bed: Dependent Is this a change from baseline?: Pre-admission baseline Walks in Home: Dependent Is this a change from baseline?: Pre-admission baseline Does the patient have difficulty walking or climbing stairs?: Yes Weakness of Legs: Both Weakness of Arms/Hands: Both  Permission Sought/Granted Permission sought to share information with : Other (comment) (Patient was asleep during visit. She is currently not fully oriented) Permission granted to share information with : No (Patient not fully oriented)              Emotional Assessment Appearance:: Appears stated age Attitude/Demeanor/Rapport: Unable to Assess (Patient was asleep during visit) Affect (typically observed): Unable to Assess (Patient asleep during visit) Orientation: :  (Disoriented times 4) Alcohol / Substance Use: Never Used Psych Involvement: No (comment)  Admission diagnosis:  Hypokalemia [E87.6] Patient Active Problem List   Diagnosis Date Noted  . Pressure injury of skin 11/19/2019  . Hypokalemia 11/22/2019  . Anemia 11/17/2019  . Severe sepsis ([place]) 10/15/2019  . Essential  hypertension 10/14/2019  . GERD without esophagitis 10/14/2019  . Pneumonia of both lower lobes due to infectious organism 10/14/2019  . Lactic acidosis 10/14/2019  . Acute cystitis without hematuria 10/14/2019  . Acute respiratory failure with hypoxia ([place]) 10/14/2019  . DNR (do not resuscitate) 03/06/2019  . Generalized weakness 03/05/2019  . Permanent atrial fibrillation ([place]) 10/27/2018  . LVH (left ventricular hypertrophy) 10/27/2018  . 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 Are interested in other resources that could assist, along with family support.  Readmission Risk Interventions No flowsheet data found.

## 2019-11-19 NOTE — Evaluation (Signed)
Clinical/Bedside Swallow Evaluation Patient Details  Name: [name] MRN: [id number] Date of Birth: 1920-05-07  Today's Date: 11/19/2019 Time: SLP Start Time (ACUTE ONLY): 2725 SLP Stop Time (ACUTE ONLY): 0916 SLP Time Calculation (min) (ACUTE ONLY): 12 min  Past Medical History:  Past Medical History:  Diagnosis Date  . Arthritis   . Breast cancer ([place])   . Chronic back pain   . COVID-19 virus infection 03/05/2019  . GERD (gastroesophageal reflux disease)   . GERD without esophagitis 10/14/2019  . History of radiation therapy 08/10/17- 09/07/17   Left Breast 28.50 Gy in 5 fractions given on a weekly basis.   . Hypertension   . UTI (urinary tract infection)    Past Surgical History:  Past Surgical History:  Procedure Laterality Date  . ABDOMINAL HYSTERECTOMY     she still has one ovary in place.   Marland Kitchen BREAST LUMPECTOMY    . BREAST LUMPECTOMY WITH RADIOACTIVE SEED LOCALIZATION Left 06/22/2017   Procedure: BREAST LUMPECTOMY WITH RADIOACTIVE SEED LOCALIZATION;  Surgeon: [name], MD;  Location: [facility];  Service: General;  Laterality: Left;  . CHOLECYSTECTOMY    . HERNIA REPAIR     HPI:  86 y.o. female, resident of SNF with medical history significant for HTN, UTI, GERD brought to ED for AMS, vomiting. Hx COVID-19.     Assessment / Plan / Recommendation Clinical Impression  Pt was lethargic; she could be awakened for brief moments, did not follow commands, and voiced intermittently but did not engage in functional speech/communication.  She accepted limited ice chips and sips of water from a straw with palpable swallow response and no s/s of aspiration, but could not remain awake in order to participate in further assessment or try other POs.  For now, recommend allowing sips of water/ice chips when alert; provide oral care.  SLP will follow for PO readiness as MS improves.  D/W RN.  SLP Visit Diagnosis: Dysphagia, unspecified (R13.10)    Aspiration Risk     tba   Diet Recommendation   sips of water when awake, ice chips       Other  Recommendations Oral Care Recommendations: Oral care QID   Follow up Recommendations Skilled Nursing facility      Frequency and Duration min 2x/week  1 week       Prognosis Prognosis for Safe Diet Advancement: Good      Swallow Study   General Date of Onset: 11/20/2019 HPI: 86 y.o. female, resident of SNF with medical history significant for HTN, UTI, GERD brought to ED for AMS, vomiting. Hx COVID-19.   Type of Study: Bedside Swallow Evaluation Previous Swallow Assessment: Jan 2021 normal findings Diet Prior to this Study: NPO Temperature Spikes Noted: No Respiratory Status: Room air History of Recent Intubation: No Behavior/Cognition: Lethargic/Drowsy Oral Cavity Assessment: Other (comment) (unable to assess) Oral Care Completed by SLP: No Oral Cavity - Dentition: Poor condition Self-Feeding Abilities: Total assist Patient Positioning: Upright in bed Baseline Vocal Quality: Normal Volitional Cough: Cognitively unable to elicit Volitional Swallow: Able to elicit    Oral/Motor/Sensory Function Overall Oral Motor/Sensory Function: Other (comment) (symmetric at baseline; did not follow commands for assessmen)   Ice Chips Ice chips: Within functional limits   Thin Liquid Thin Liquid: Within functional limits    Nectar Thick Nectar Thick Liquid: Not tested   Honey Thick Honey Thick Liquid: Not tested   Puree Puree: Not tested   Solid     Solid: Not tested  Juan Quam Laurice 11/19/2019,9:20 AM  Estill Bamberg L. Tivis Ringer, [place] Office number [phone] Pager [phone]

## 2019-11-19 NOTE — Progress Notes (Signed)
PT Cancellation Note  Patient Details Name: LIESL SIMONS MRN: [id number] DOB: 1920/09/18   Cancelled Treatment:    Reason Eval/Treat Not Completed: Other (comment) holding PT per MD request pending palliative consult. Will f/u tomorrow and potentially sign off if appropriate and family/medical team agreeable.    Windell Norfolk, DPT, PN1   Supplemental Physical Therapist [facility]    Pager [phone] Acute Rehab Office [phone]

## 2019-11-20 DIAGNOSIS — E876 Hypokalemia: Secondary | ICD-10-CM | POA: Diagnosis not present

## 2019-11-20 LAB — CBC
HCT: 28.8 % — ABNORMAL LOW (ref 36.0–46.0)
Hemoglobin: 9.1 g/dL — ABNORMAL LOW (ref 12.0–15.0)
MCH: 28 pg (ref 26.0–34.0)
MCHC: 31.6 g/dL (ref 30.0–36.0)
MCV: 88.6 fL (ref 80.0–100.0)
Platelets: 296 10*3/uL (ref 150–400)
RBC: 3.25 MIL/uL — ABNORMAL LOW (ref 3.87–5.11)
RDW: 18.1 % — ABNORMAL HIGH (ref 11.5–15.5)
WBC: 9.7 10*3/uL (ref 4.0–10.5)
nRBC: 0 % (ref 0.0–0.2)

## 2019-11-20 LAB — BASIC METABOLIC PANEL
Anion gap: 15 (ref 5–15)
BUN: 13 mg/dL (ref 8–23)
CO2: 28 mmol/L (ref 22–32)
Calcium: 8.3 mg/dL — ABNORMAL LOW (ref 8.9–10.3)
Chloride: 92 mmol/L — ABNORMAL LOW (ref 98–111)
Creatinine, Ser: 0.67 mg/dL (ref 0.44–1.00)
GFR calc non Af Amer: >60 mL/min (ref >60)
Glucose, Bld: 85 mg/dL (ref 70–99)
Potassium: 3.8 mmol/L (ref 3.5–5.1)
Sodium: 135 mmol/L (ref 135–145)

## 2019-11-20 LAB — MRSA PCR SCREENING: MRSA by PCR: NEGATIVE

## 2019-11-20 MED ORDER — SODIUM CHLORIDE 0.9 % IV SOLN
500.0000 mg | INTRAVENOUS | Status: DC
  Administered 2019-11-20: 500 mg via INTRAVENOUS
  Filled 2019-11-20 (×2): qty 500

## 2019-11-20 MED ORDER — ENSURE ENLIVE PO LIQD
237.0000 mL | Freq: Three times a day (TID) | ORAL | Status: DC
  Administered 2019-11-20 – 2019-11-22 (×3): 237 mL via ORAL

## 2019-11-20 MED ORDER — DEXTROSE IN LACTATED RINGERS 5 % IV SOLN: INTRAVENOUS | Status: DC

## 2019-11-20 NOTE — Evaluation (Signed)
Physical Therapy Evaluation Patient Details Name: [name] MRN: [id number] DOB: 1920/08/10 Today's Date: 11/20/2019   History of Present Illness  Pt is a 86 y/o female with PMH of HTN, UTI, recent covid, arthritis, brought to ER for AMS. Found with hypokalemia, afib, PNA with pleural effusion.     Clinical Impression  Pt in bed upon arrival of PT, awoke momentarily to voice during evaluation, but was unable to maintain alertness or engagement. Prior to admission the pt was living at [facility], receiving assist for all transfers, ADLs, and mobilizing in a WC with assist from staff. The family would like to avoid return to SNF and voiced interest in d/c home with hospice or to inpatient hospice facility. The pt presents with limitations in functional mobility, activity tolerance, and ROM due to above dx, chronically decreased strength, and pain with passive ROM. Therefore, if she were to return home, she would need the DME listed below to allow for safe mobility, transfers, and reduced caregiver burden. Due to the family's interest in comfort measures, there are no further acute PT needs identified at this time, and we will sign off. If there is a significant change in status or plan, please feel free to re-consult.     Follow Up Recommendations Other (comment) (inpatient hospice vs home with home hospice)    Equipment Recommendations  Wheelchair (measurements PT);Wheelchair cushion (measurements PT);Hospital bed (hoyer lift (if d/c home with home hospice))    Recommendations for Other Services       Precautions / Restrictions Precautions Precautions: Fall Restrictions Weight Bearing Restrictions: No      Mobility  Bed Mobility Overal bed mobility: Needs Assistance Bed Mobility: Rolling Rolling: Total assist;+2 for safety/equipment;+2 for physical assistance         General bed mobility comments: total assist +2 to reposition on L side and elevated R UE, scoot towards  [facility]  Transfers                 General transfer comment: pt unable            Pertinent Vitals/Pain Pain Assessment: Faces Faces Pain Scale: Hurts little more Pain Location: generalized Pain Descriptors / Indicators: Discomfort;Grimacing;Guarding Pain Intervention(s): Limited activity within patient's tolerance;Monitored during session;Repositioned    Home Living Family/patient expects to be discharged to:: Hospice/Palliative care                 Additional Comments: pt in long term care for some time, daughter and grandson reports home with hospice or hospice facility (do not want to return to SNF)    Prior Function Level of Independence: Needs assistance   Gait / Transfers Assistance Needed: family has not been able to visit reccently, reports pt used WC for mobility and likely received assist for all transfers (possible use of lift)  ADL's / Homemaking Assistance Needed: pt family reports likely total assist reccently  Comments: per family requires total care for self care, eating and transfers (they anticipate she is lifted to a wheelchair at the facility)      Hand Dominance        Extremity/Trunk Assessment   Upper Extremity Assessment Upper Extremity Assessment: Defer to OT evaluation RUE Deficits / Details: edema in UE, pt unable to squeeze hand on command but reaching to hold therapist hand during session; generalized weakness  LUE Deficits / Details: unable to squeeze hand on command, generalized weakness     Lower Extremity Assessment Lower Extremity Assessment: Generalized weakness (pressure  sore on L heel, pt grimacing with PROM to BLE)       Communication   Communication: HOH  Cognition Arousal/Alertness: Lethargic Behavior During Therapy: Flat affect Overall Cognitive Status: Difficult to assess                                 General Comments: patient able to open eyes to name, voices "no" at times when asked to  complete simple tasks; reports she is in the hospital and able to engage with family; minimal engagmenet       General Comments General comments (skin integrity, edema, etc.): family at bedside (daughter and grandson), very supportive        Assessment/Plan    PT Assessment Patent does not need any further PT services  PT Problem List         PT Treatment Interventions      PT Goals (Current goals can be found in the Care Plan section)  Acute Rehab PT Goals Patient Stated Goal: not go back to SNF  PT Goal Formulation: With patient/family Time For Goal Achievement: 12/04/19 Potential to Achieve Goals: Poor (good potential to avoid d/c to SNF, overall poor prognosis)    Frequency     Barriers to discharge        Co-evaluation PT/OT/SLP Co-Evaluation/Treatment: Yes Reason for Co-Treatment: Complexity of the patient's impairments (multi-system involvement);For patient/therapist safety PT goals addressed during session: Mobility/safety with mobility;Strengthening/ROM OT goals addressed during session: ADL's and self-care       AM-PAC PT "6 Clicks" Mobility  Outcome Measure Help needed turning from your back to your side while in a flat bed without using bedrails?: Total Help needed moving from lying on your back to sitting on the side of a flat bed without using bedrails?: Total Help needed moving to and from a bed to a chair (including a wheelchair)?: Total Help needed standing up from a chair using your arms (e.g., wheelchair or bedside chair)?: Total Help needed to walk in hospital room?: Total Help needed climbing 3-5 steps with a railing? : Total 6 Click Score: 6    End of Session Equipment Utilized During Treatment: Oxygen (pt on 2L O2) Activity Tolerance: Patient limited by lethargy;Patient limited by pain Patient left: in bed;with bed alarm set;with family/visitor present Nurse Communication: Mobility status PT Visit Diagnosis: Muscle weakness (generalized)  (M62.81);Other abnormalities of gait and mobility (R26.89)    Time: 2035-5974 PT Time Calculation (min) (ACUTE ONLY): 29 min   Charges:   PT Evaluation $PT Eval Low Complexity: 1 Low          Karma Ganja, PT, DPT   Acute Rehabilitation Department Pager #: [phone]  Otho Bellows 11/20/2019, 4:10 PM

## 2019-11-20 NOTE — Evaluation (Signed)
Occupational Therapy Evaluation Patient Details Name: [name] MRN: [id number] DOB: 09-08-1920 Today's Date: 11/20/2019    History of Present Illness Pt is a 86 y/o female with PMH of HTN, UTI, recent covid, arthritis, brought to ER for AMS. Found with hypokalemia, afib, PNA with pleural effusion.    Clinical Impression   PTA patient resided at SNF, family at bedside reporting plan to not return to SNF.  Family reports patient recently requiring total assist for self care, mobility (lifting to wheelchair) at SNF. Admitted for above and presenting with limited alertness, but able to open eyes briefly to name, engaging minimally with family (daugther and grandson), pt oriented to location ([place]) but not following commands to squeeze hands, although voicing "no" when asked.  She requires total assist for self care and bed mobility at this time.  Today, repositioned to on L side and elevated R UE on blankets to reduce edema to UE/ improve patient comfort.  Patients grandson and daughter at bedside, reporting "she looks much more comfortable now".  If patient were to dc home with home hospice, patient will need equipment below.  No OT services indicated at this time.     Follow Up Recommendations  Other (comment);Supervision/Assistance - 24 hour (hospice )    Equipment Recommendations  Wheelchair (measurements OT);Wheelchair cushion (measurements OT);Hospital bed;Other (comment) (hoyer lift)    Recommendations for Other Services       Precautions / Restrictions Precautions Precautions: Fall Restrictions Weight Bearing Restrictions: No      Mobility Bed Mobility Overal bed mobility: Needs Assistance Bed Mobility: Rolling Rolling: Total assist;+2 for safety/equipment;+2 for physical assistance         General bed mobility comments: total assist +2 to reposition on L side and elevated R UE, scoot towards [facility]  Transfers                 General transfer comment:  deferred     Balance                                           ADL either performed or assessed with clinical judgement   ADL Overall ADL's : Needs assistance/impaired                                     Functional mobility during ADLs: Total assistance General ADL Comments: total assist for all self care at this time     Vision   Additional Comments: opens eyes to name, recognizes family members; unable to maintain eyes open     Perception     Praxis      Pertinent Vitals/Pain Pain Assessment: Faces Faces Pain Scale: Hurts little more Pain Location: generalized Pain Descriptors / Indicators: Discomfort;Grimacing;Guarding Pain Intervention(s): Limited activity within patient's tolerance;Monitored during session;Repositioned     Hand Dominance     Extremity/Trunk Assessment Upper Extremity Assessment Upper Extremity Assessment: Generalized weakness;RUE deficits/detail;LUE deficits/detail RUE Deficits / Details: edema in UE, pt unable to squeeze hand on command but reaching to hold therapist hand during session; generalized weakness  LUE Deficits / Details: unable to squeeze hand on command, generalized weakness    Lower Extremity Assessment Lower Extremity Assessment: Defer to PT evaluation       Communication Communication Communication: HOH   Cognition Arousal/Alertness: Lethargic Behavior During  Therapy: Flat affect Overall Cognitive Status: Difficult to assess                                 General Comments: patient able to open eyes to name, voices "no" at times when asked to complete simple tasks; reports she is in the hospital and able to engage with family; minimal engagmenet    General Comments  family at bedside, very supportive     Exercises     Shoulder Instructions      Home Living Family/patient expects to be discharged to:: Hospice/Palliative care                                  Additional Comments: pt in long term care for some time, daughter and grandson reports home with hospice or hospice facility (do not want to return to SNF)      Prior Functioning/Environment Level of Independence: Needs assistance        Comments: per family requires total care for self care, eating and transfers (they anticipate she is lifted to a wheelchair at the facility)         OT Problem List:        OT Treatment/Interventions:      OT Goals(Current goals can be found in the care plan section) Acute Rehab OT Goals Patient Stated Goal: not go back to SNF  OT Goal Formulation: With family  OT Frequency:     Barriers to D/C:            Co-evaluation PT/OT/SLP Co-Evaluation/Treatment: Yes Reason for Co-Treatment: Complexity of the patient's impairments (multi-system involvement)   OT goals addressed during session: ADL's and self-care      AM-PAC OT "6 Clicks" Daily Activity     Outcome Measure Help from another person eating meals?: Total Help from another person taking care of personal grooming?: Total Help from another person toileting, which includes using toliet, bedpan, or urinal?: Total Help from another person bathing (including washing, rinsing, drying)?: Total Help from another person to put on and taking off regular upper body clothing?: Total Help from another person to put on and taking off regular lower body clothing?: Total 6 Click Score: 6   End of Session Nurse Communication: Mobility status  Activity Tolerance: Patient limited by lethargy;Patient limited by pain Patient left: in bed;with call bell/phone within reach;with bed alarm set;with family/visitor present;with SCD's reapplied  OT Visit Diagnosis: Muscle weakness (generalized) (M62.81);Pain Pain - part of body:  (generalized)                Time: 1610-9604 OT Time Calculation (min): 29 min Charges:  OT General Charges $OT Visit: 1 Visit OT Evaluation $OT Eval Low Complexity: 1  Low  Jolaine Artist, OT Acute Rehabilitation Services Pager [phone] Office [phone]    Delight Stare 11/20/2019, 3:16 PM

## 2019-11-20 NOTE — Progress Notes (Signed)
PROGRESS NOTE    [name]  KGM:[id number] DOB: 1921/02/05 DOA: 11/21/2019 PCP: Lawerance Cruel, MD   Brief Narrative: 86 year old with past medical history significant for hypertension, UTI, brought to the ER for altered mental status.  Per history patient has had vomiting black fluid content.  Patient also had Covid and recovered well and went back to facility. Patient was given Levaquin for possible pneumonia/UTI.  Evaluation in the ED blood pressure 106/74, temperature lower 97, oxygen sat 94 on 3 L of oxygen Bilateral pleural effusion and atelectasis.  Assessment & Plan:   Principal Problem:   Hypokalemia Active Problems:   Permanent atrial fibrillation (HCC)   DNR (do not resuscitate)   Essential hypertension   Anemia   Pressure injury of skin  Hypokalemia; Repleted IV.  Replace magnesium as well Likely due to poor oral intake.  Hold diuretics Resolved.   A. fib: Continue to monitor hemoglobin Hold Eliquis hemoglobin further dropped to 8 Hb stable today at 9  Hypertension: Continue with metoprolol  Anemia, vomiting black content episode; Continue with IV Protonix twice daily Continue to monitor hemoglobin Hb at 9.1 today from 8.6   Pna pleural effusion; Started ceftriaxone and azithromycin.  Although no leukocytosis or fever.   Acute metabolic encephalopathy;  Could be related to antibiotics, levaquin , FTT Continue to be sleepy today.   FTT; Hypoalbuminemia;  Poor oral intake.  Poor prognosis if she does not improve or start eating.  Palliative care consulted.  Added low rate IV fluids.   Right arm with edema;  Might be related to IV infiltration. Nurse will check IV   Pressure Injury 12/09/2019 Heel Left Deep Tissue Pressure Injury - Purple or maroon localized area of discolored intact skin or blood-filled blister due to damage of underlying soft tissue from pressure and/or shear. (Active)  11/17/2019 2200  Location: Heel  Location  Orientation: Left  Staging: Deep Tissue Pressure Injury - Purple or maroon localized area of discolored intact skin or blood-filled blister due to damage of underlying soft tissue from pressure and/or shear.  Wound Description (Comments):   Present on Admission: Yes        Estimated body mass index is 23.19 kg/m as calculated from the following:   Height as of 10/16/19: 5\' 5"  (1.651 m).   Weight as of 10/16/19: 63.2 kg.   DVT prophylaxis: SCD Code Status: DNR Family Communication:Daughter at bedside 10/06 Disposition Plan:  Status is: Inpatient  Remains inpatient appropriate because:Ongoing diagnostic testing needed not appropriate for outpatient work up   Dispo: The patient is from: SNF              Anticipated d/c is to: to be determine              Anticipated d/c date is: 2 days              Patient currently is not medically stable to d/c.        Consultants:   Palliative care   Procedures:   none  Antimicrobials:    Subjective: Sleepy, denies abdominal pain. Lying down in fetal position.   Objective: Vitals:   11/19/19 0937 11/19/19 2126 11/20/19 0525 11/20/19 0902  BP: 107/63 102/70 98/65 116/74  Pulse: 88 67 85 93  Resp: 18 14 17  (!) 21  Temp: 98.2 F (36.8 C) 98.4 F (36.9 C) 98.2 F (36.8 C) 98.7 F (37.1 C)  TempSrc: Oral  Oral Oral  SpO2: 98% 98% 98% 95%  Intake/Output Summary (Last 24 hours) at 11/20/2019 1323 Last data filed at 11/20/2019 1200 Gross per 24 hour  Intake 149.97 ml  Output 100 ml  Net 49.97 ml   There were no vitals filed for this visit.  Examination:  General exam: NAD Respiratory system: Decreased at bases.  Cardiovascular system: S 1, S 2  RRR Gastrointestinal system: BS present, soft, nt Central nervous system: aleepy Extremities: trace edema, right arm with edema.   Data Reviewed: I have personally reviewed following labs and imaging studies  CBC: Recent Labs  Lab 11/15/2019 1450 12/01/2019 1956  11/19/19 0227 11/20/19 0118  WBC 10.7*  --  10.0 9.7  NEUTROABS 8.3*  --  7.2  --   HGB 10.0* 10.9* 8.6* 9.1*  HCT 32.4* 32.0* 28.6* 28.8*  MCV 88.5  --  89.9 88.6  PLT 349  --  284 161   Basic Metabolic Panel: Recent Labs  Lab 12/11/2019 1450 12/12/2019 1450 12/14/2019 1744 11/17/2019 1956 12/08/2019 2333 11/19/19 0227 11/19/19 1125 11/20/19 0838  NA 134*  --   --  133*  --  133* 130* 135  K 2.4*   < >  --  2.6* 2.7* 2.7* 5.1 3.8  CL 86*  --   --   --   --  88* 89* 92*  CO2 38*  --   --   --   --  34* 30 28  GLUCOSE 129*  --   --   --   --  115* 106* 85  BUN 18  --   --   --   --  16 17 13   CREATININE 0.74  --   --   --   --  0.58 0.60 0.67  CALCIUM 8.8*  --   --   --   --  8.4* 8.3* 8.3*  MG  --   --  1.3*  --   --  1.7  --   --   PHOS  --   --   --   --   --  2.6  --   --    < > = values in this interval not displayed.   GFR: CrCl cannot be calculated (Unknown ideal weight.). Liver Function Tests: Recent Labs  Lab 11/19/2019 1450 11/19/19 0227  AST 26 23  ALT 15 14  ALKPHOS 59 49  BILITOT 0.8 1.0  PROT 5.5* 4.8*  ALBUMIN 1.9* 1.7*   Recent Labs  Lab 11/14/2019 1450  LIPASE 22   No results for input(s): AMMONIA in the last 168 hours. Coagulation Profile: Recent Labs  Lab 12/10/2019 1744  INR 1.6*   Cardiac Enzymes: No results for input(s): CKTOTAL, CKMB, CKMBINDEX, TROPONINI in the last 168 hours. BNP (last 3 results) No results for input(s): PROBNP in the last 8760 hours. HbA1C: No results for input(s): HGBA1C in the last 72 hours. CBG: No results for input(s): GLUCAP in the last 168 hours. Lipid Profile: No results for input(s): CHOL, HDL, LDLCALC, TRIG, CHOLHDL, LDLDIRECT in the last 72 hours. Thyroid Function Tests: Recent Labs    12/08/2019 1948  TSH 4.672*  FREET4 1.20*   Anemia Panel: Recent Labs    12/03/2019 1944 12/14/2019 1948  VITAMINB12  --  686  FOLATE  --  13.4  FERRITIN  --  49  TIBC  --  204*  IRON  --  31  RETICCTPCT 1.9  --     Sepsis Labs: Recent Labs  Lab 12/03/2019 1451 11/16/2019 1943 11/29/2019  2333 11/19/19 0227  LATICACIDVEN 2.8* 2.6* 2.5* 1.9    Recent Results (from the past 240 hour(s))  Resp Panel by RT PCR (RSV, Flu A&B, Covid) - Nasopharyngeal Swab     Status: None   Collection Time: 11/22/2019  3:03 PM   Specimen: Nasopharyngeal Swab  Result Value Ref Range Status   SARS Coronavirus 2 by RT PCR NEGATIVE NEGATIVE Final    Comment: (NOTE) SARS-CoV-2 target nucleic acids are NOT DETECTED.  The SARS-CoV-2 RNA is generally detectable in upper respiratoy specimens during the acute phase of infection. The lowest concentration of SARS-CoV-2 viral copies this assay can detect is 131 copies/mL. A negative result does not preclude SARS-Cov-2 infection and should not be used as the sole basis for treatment or other patient management decisions. A negative result may occur with  improper specimen collection/handling, submission of specimen other than nasopharyngeal swab, presence of viral mutation(s) within the areas targeted by this assay, and inadequate number of viral copies (<131 copies/mL). A negative result must be combined with clinical observations, patient history, and epidemiological information. The expected result is Negative.  Fact Sheet for Patients:  PinkCheek.be  Fact Sheet for Healthcare Providers:  GravelBags.it  This test is no t yet approved or cleared by the [place] FDA and  has been authorized for detection and/or diagnosis of SARS-CoV-2 by FDA under an Emergency Use Authorization (EUA). This EUA will remain  in effect (meaning this test can be used) for the duration of the COVID-19 declaration under Section 564(b)(1) of the Act, 21 U.S.C. section 360bbb-3(b)(1), unless the authorization is terminated or revoked sooner.     Influenza A by PCR NEGATIVE NEGATIVE Final   Influenza B by PCR NEGATIVE NEGATIVE Final     Comment: (NOTE) The Xpert Xpress SARS-CoV-2/FLU/RSV assay is intended as an aid in  the diagnosis of influenza from Nasopharyngeal swab specimens and  should not be used as a sole basis for treatment. Nasal washings and  aspirates are unacceptable for Xpert Xpress SARS-CoV-2/FLU/RSV  testing.  Fact Sheet for Patients: PinkCheek.be  Fact Sheet for Healthcare Providers: GravelBags.it  This test is not yet approved or cleared by the [place] FDA and  has been authorized for detection and/or diagnosis of SARS-CoV-2 by  FDA under an Emergency Use Authorization (EUA). This EUA will remain  in effect (meaning this test can be used) for the duration of the  Covid-19 declaration under Section 564(b)(1) of the Act, 21  U.S.C. section 360bbb-3(b)(1), unless the authorization is  terminated or revoked.    Respiratory Syncytial Virus by PCR NEGATIVE NEGATIVE Final    Comment: (NOTE) Fact Sheet for Patients: PinkCheek.be  Fact Sheet for Healthcare Providers: GravelBags.it  This test is not yet approved or cleared by the [place] FDA and  has been authorized for detection and/or diagnosis of SARS-CoV-2 by  FDA under an Emergency Use Authorization (EUA). This EUA will remain  in effect (meaning this test can be used) for the duration of the  COVID-19 declaration under Section 564(b)(1) of the Act, 21 U.S.C.  section 360bbb-3(b)(1), unless the authorization is terminated or  revoked. Performed at [facility], [place] [address], [place], [place] [address]   MRSA PCR Screening     Status: None   Collection Time: 11/20/19  1:02 AM   Specimen: Nasopharyngeal  Result Value Ref Range Status   MRSA by PCR NEGATIVE NEGATIVE Final    Comment:        The GeneXpert  MRSA Assay (FDA approved for NASAL specimens only), is one component of a comprehensive MRSA  colonization surveillance program. It is not intended to diagnose MRSA infection nor to guide or monitor treatment for MRSA infections. Performed at [facility], [place] [address] [place], [place] [address]          Radiology Studies: CT Head Wo Contrast  Result Date: 12/08/2019 CLINICAL DATA:  Delirium altered EXAM: CT HEAD WITHOUT CONTRAST TECHNIQUE: Contiguous axial images were obtained from the base of the skull through the vertex without intravenous contrast. COMPARISON:  CT brain 03/22/2019 FINDINGS: Brain: No acute territorial infarction, hemorrhage, or intracranial mass. Chronic left cerebellar infarct. Chronic appearing infarct within the left frontal lobe white matter but new since February 2021. Moderate atrophy. Moderate hypodensity in the white matter consistent with chronic small vessel ischemic change. Stable ventricle size. Vascular: No hyperdense vessels.  Carotid vascular calcification Skull: Normal. Negative for fracture or focal lesion. Sinuses/Orbits: No acute finding. Other: None IMPRESSION: 1. No CT evidence for acute intracranial abnormality. 2. Atrophy and chronic small vessel ischemic changes of the white matter. Chronic left cerebellar infarct. Chronic appearing left frontal lobe infarct but new since February 2021. Electronically Signed   By: Donavan Foil M.D.   On: 11/15/2019 16:14   DG Chest Portable 1 View  Result Date: 12/09/2019 CLINICAL DATA:  Altered mental status. EXAM: PORTABLE CHEST 1 VIEW COMPARISON:  10/14/2019 FINDINGS: The cardiac silhouette, mediastinal and hilar contours are within normal limits and stable given the patient's age. There is moderate tortuosity and calcification of the thoracic aorta. Persistent moderate-sized bilateral pleural effusions, right greater than left with significant overlying atelectasis. No definite infiltrates or pneumothorax. IMPRESSION: Persistent moderate-sized bilateral pleural effusions with significant  overlying atelectasis. Electronically Signed   By: Marijo Sanes M.D.   On: 11/20/2019 15:13        Scheduled Meds: . calcium carbonate  2 tablet Oral Daily  . feeding supplement (ENSURE ENLIVE)  237 mL Oral BID BM  . metoprolol succinate  50 mg Oral Daily  . pantoprazole (PROTONIX) IV  40 mg Intravenous Q12H   Continuous Infusions: . azithromycin    . cefTRIAXone (ROCEPHIN)  IV Stopped (11/19/19 2038)  . dextrose 5% lactated ringers 40 mL/hr at 11/20/19 1200     LOS: 1 day    Time spent: 35 minutes.     Elmarie Shiley, MD [facility]   If 7PM-7AM, please contact night-coverage [email or web address]  11/20/2019, 1:23 PM

## 2019-11-20 NOTE — Progress Notes (Signed)
  Speech Language Pathology Treatment: Dysphagia  Patient Details Name: [name] MRN: [id number] DOB: 05-25-1920 Today's Date: 11/20/2019 Time: 1349-1400 SLP Time Calculation (min) (ACUTE ONLY): 11 min  Assessment / Plan / Recommendation Clinical Impression  F/u after yesterday's swallow assessment.  Pt more rousable today, more alert, but generally sleeping. Her grandson, Gaspar Bidding, was at the bedside.  RN reports she drank some fluids today without difficulty.  Repositioned bed to elevate head as much as possible while keeping her comfortable.  She would awaken briefly in order to sip some Ensure from a straw with good oral attention and control, brisk swallow, no s/s of aspiration.  Recommend advancing diet to full liquids for now.  If pt becomes more alert, advance diet to regular solids/thin liquids or according to her preferences.  She does not need SLP services - we will sign off.  D/W RN.    HPI HPI: 86 y.o. female, resident of SNF with medical history significant for HTN, UTI, GERD brought to ED for AMS, vomiting. Hx COVID-19.        SLP Plan  Discharge SLP treatment due to (comment) - pt's diet may be advanced per RN       Recommendations  Diet recommendations: Other(comment) (full liquids) Liquids provided via: Straw Medication Administration: Whole meds with liquid Supervision: Full supervision/cueing for compensatory strategies Postural Changes and/or Swallow Maneuvers: Seated upright 90 degrees                Oral Care Recommendations: Oral care QID Follow up Recommendations: Skilled Nursing facility SLP Visit Diagnosis: Dysphagia, unspecified (R13.10) Plan: Discharge SLP treatment due to (comment)       GO                Juan Quam Laurice 11/20/2019, 2:06 PM  Dalila Arca L. Tivis Ringer, [place] Office number [phone] Pager [phone]

## 2019-11-21 DIAGNOSIS — Z515 Encounter for palliative care: Secondary | ICD-10-CM

## 2019-11-21 DIAGNOSIS — E876 Hypokalemia: Secondary | ICD-10-CM | POA: Diagnosis not present

## 2019-11-21 DIAGNOSIS — Z7189 Other specified counseling: Secondary | ICD-10-CM | POA: Diagnosis not present

## 2019-11-21 DIAGNOSIS — Z66 Do not resuscitate: Secondary | ICD-10-CM | POA: Diagnosis not present

## 2019-11-21 LAB — CBC
HCT: 27.5 % — ABNORMAL LOW (ref 36.0–46.0)
Hemoglobin: 8.8 g/dL — ABNORMAL LOW (ref 12.0–15.0)
MCH: 27.9 pg (ref 26.0–34.0)
MCHC: 32 g/dL (ref 30.0–36.0)
MCV: 87.3 fL (ref 80.0–100.0)
Platelets: 293 10*3/uL (ref 150–400)
RBC: 3.15 MIL/uL — ABNORMAL LOW (ref 3.87–5.11)
RDW: 18.5 % — ABNORMAL HIGH (ref 11.5–15.5)
WBC: 9.3 10*3/uL (ref 4.0–10.5)
nRBC: 0 % (ref 0.0–0.2)

## 2019-11-21 LAB — CORTISOL-AM, BLOOD: Cortisol - AM: 19.1 ug/dL (ref 6.7–22.6)

## 2019-11-21 LAB — BASIC METABOLIC PANEL
Anion gap: 10 (ref 5–15)
BUN: 15 mg/dL (ref 8–23)
CO2: 33 mmol/L — ABNORMAL HIGH (ref 22–32)
Calcium: 8.4 mg/dL — ABNORMAL LOW (ref 8.9–10.3)
Chloride: 92 mmol/L — ABNORMAL LOW (ref 98–111)
Creatinine, Ser: 0.64 mg/dL (ref 0.44–1.00)
GFR calc non Af Amer: >60 mL/min (ref >60)
Glucose, Bld: 123 mg/dL — ABNORMAL HIGH (ref 70–99)
Potassium: 3.3 mmol/L — ABNORMAL LOW (ref 3.5–5.1)
Sodium: 135 mmol/L (ref 135–145)

## 2019-11-21 MED ORDER — AZITHROMYCIN 500 MG PO TABS: 500.0000 mg | ORAL_TABLET | Freq: Every day | ORAL | Status: DC

## 2019-11-21 MED ORDER — ATROPINE SULFATE 1 % OP SOLN
2.0000 [drp] | Freq: Three times a day (TID) | OPHTHALMIC | Status: DC | PRN
  Filled 2019-11-21: qty 2

## 2019-11-21 MED ORDER — MORPHINE SULFATE 10 MG/5ML PO SOLN
2.5000 mg | ORAL | Status: DC | PRN
  Filled 2019-11-21: qty 2

## 2019-11-21 MED ORDER — HYPROMELLOSE (GONIOSCOPIC) 2.5 % OP SOLN
2.0000 [drp] | Freq: Four times a day (QID) | OPHTHALMIC | Status: DC | PRN
  Filled 2019-11-21: qty 15

## 2019-11-21 MED ORDER — POTASSIUM CHLORIDE 20 MEQ PO PACK
40.0000 meq | PACK | Freq: Two times a day (BID) | ORAL | Status: DC
  Filled 2019-11-21 (×2): qty 2

## 2019-11-21 MED ORDER — BISACODYL 10 MG RE SUPP: 10.0000 mg | Freq: Every day | RECTAL | Status: DC | PRN

## 2019-11-21 MED ORDER — MORPHINE SULFATE (PF) 2 MG/ML IV SOLN: 1.0000 mg | INTRAVENOUS | Status: DC | PRN

## 2019-11-21 MED ORDER — ONDANSETRON HCL 4 MG/2ML IJ SOLN: 4.0000 mg | Freq: Four times a day (QID) | INTRAMUSCULAR | Status: DC | PRN

## 2019-11-21 MED ORDER — BIOTENE DRY MOUTH MT LIQD: 15.0000 mL | OROMUCOSAL | Status: DC | PRN

## 2019-11-21 MED ORDER — PANTOPRAZOLE SODIUM 40 MG PO PACK: 40.0000 mg | PACK | Freq: Every day | ORAL | Status: DC

## 2019-11-21 MED ORDER — POTASSIUM CHLORIDE 10 MEQ/100ML IV SOLN: 10.0000 meq | INTRAVENOUS | Status: DC

## 2019-11-21 MED ORDER — LORAZEPAM 2 MG/ML IJ SOLN: 0.5000 mg | INTRAMUSCULAR | Status: DC | PRN

## 2019-11-21 MED ORDER — CEFDINIR 250 MG/5ML PO SUSR
300.0000 mg | Freq: Two times a day (BID) | ORAL | Status: DC
  Filled 2019-11-21 (×2): qty 6

## 2019-11-21 NOTE — Progress Notes (Signed)
   11/21/19 1602  Clinical Encounter Type  Visited With Patient and family together  Visit Type Initial;Spiritual support  Referral From Palliative care team  Consult/Referral To Chaplain  Spiritual Encounters  Spiritual Needs Grief support;Prayer  This chaplain responded to PMT consult for EOL spiritual care.  The Pt. is resting comfortably at the time of the visit. The Pt. daughter-Cindy and grandson are bedside.  [name] shares today's decision for comfort care was difficult, but there is peace in knowing the Pt.'s wish to see her heavenly family is being honored.  Plans are being made for family to visit, tell stories, and say goodbye.    The chaplain confirmed the Pt. Mormon faith tradition with Programmer, systems.  The chaplain is not aware of any special instructions.  F/U spiritual care is available as needed.

## 2019-11-21 NOTE — Progress Notes (Signed)
Manufacturing engineer [facility]) Hospital Liaison note.    Received request from [place] for family interest in [facility]. [facility] is unable to offer a room today. Hospital Liaison will follow up tomorrow or sooner if a room becomes available.   Please do not hesitate to call with questions.    Thank you for the opportunity to participate in this patient's care.  Chrislyn Edison Pace, BSN, RN [facility] Liaison (listed on AMION under Hospice[facility])    [phone] (24h on call)

## 2019-11-21 NOTE — Plan of Care (Signed)
  Problem: Nutrition: Goal: Adequate nutrition will be maintained Outcome: Progressing   Problem: Skin Integrity: Goal: Risk for impaired skin integrity will decrease Outcome: Progressing   

## 2019-11-21 NOTE — Consult Note (Signed)
Palliative Medicine Inpatient Consult Note  Reason for consult:  Goals of Care  HPI:  Per intake H&P --> 86 year old with past medical history significant for hypertension, UTI, brought to the ER for altered mental status.  Per history patient has had vomiting black fluid content.  Patient also had Covid and recovered well and went back to [facility]. Was receiving Levaquin for suspected UTI/PNA.  Palliative care was asked to get involved to aid in goals of care conversations.   Clinical Assessment/Goals of Care: I have reviewed medical records including EPIC notes, labs and imaging, received report from bedside RN, assessed the patient she was sleeping in no distress on 2LPM Lucien.    I met with [name]  to further discuss diagnosis prognosis, GOC, EOL wishes, disposition and options.   I introduced Palliative Medicine as specialized medical care for people living with serious illness. It focuses on providing relief from the symptoms and stress of a serious illness. The goal is to improve quality of life for both the patient and the family.  A brief historical review was completed.  [name] is described as tough per she had been fairly functional until the age of 15 she used to work in her yard regularly.  She was married and has a daughter, [name] who is involved in her care though due to her own physical ailments is unable to continue caring for her. She has three grandchildren who are very actively involved in her life though two live in [place].  She is noted to have great pride in her appearance.  She was a very proud woman. She is a person of faith and practices within the [facility] denomination.   [name] unfortunately had been noted to be slowing down. For the past two years she has spent much of the day watching TV.  As of the most recent acute decline she suffered a fall in January when her granddaughter went to help her she was noted to be incontinent and delirious she was later  found to have Covid 19 a urinary infection and A. fib with rapid ventricular rate.  Patient is a long term resident at [facility] (has medicaid). From a functional perspective she has required aid in all bADLs since COVID-19 infection.   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 Discussed that it appears it causes severe weakness, worsening cognitive impairments, and have a prolonged loss of appetite. These things in combination lead to a progressive declining health state.   A detailed discussion was had today regarding advanced directives - she does have these per [name] though a cope is not on file. Her daughter, [name] is her POA.    Concepts specific to code status, artifical feeding and hydration, continued IV antibiotics and rehospitalization was had.  The difference between a aggressive medical intervention path  and a palliative comfort care path for this patient at this time was had. Patient is identified as DNAR/DNI, no HD, no feeding tubes or extraneous measures to continue living. Discussed her lack of IV access and inability to safely swallow pills right now. Her daughter [name] was called on Speakerphone.   We talked about transition to comfort measures in house and what that would entail inclusive of medications to control pain, dyspnea, agitation, nausea, itching, and hiccups.   We discussed stopping all uneccessary measures such as blood draws, needle sticks, and frequent vital signs. The decision was made  to transition [name] to comfort focused care.  Her family shares that they feel she would be best served at [facility] under their inpatient hospice.  I described hospice as a service for patients for have a life expectancy of < 6 months. It preserves dignity and quality at the end phases of life. The focus changes from curative to symptom relief. We discussed that under comfort oriented care we are  already starting hospice and implementing their philosophies.   Utilized reflective listening throughout our time together.   Discussed the importance of continued conversation with family and their  medical providers regarding overall plan of care and treatment options, ensuring decisions are within the context of the patients values and GOCs.  Decision Maker: [name] [phone]  SUMMARY OF RECOMMENDATIONS   DNAR/DNI  Comfort Care  Spiritual Care - Mormon  TOC --> Request a bed at [facility]: DNAR/DNI   Symptom Management:  Dyspnea: Pain:  - Morphine 59m PO/SL Q1H PRN  - Morphine 1-239mIV Q1H PRN Fever:  - Tylenol 65028mO/PR Q6H PRN Agitation: Anxiety:  - Lorazepam 0.5-1mg32mV  Q1H PRN Nausea:  - Zofran 4mg 43mQ6H PRN  Secretions:  - Atropine II drops PO TID PRN Dry Eyes:  - Artificial Tears PRN Xerostomia:  - Biotene 15ml 19m - BID oral care Urinary Retention:  - Bladder scan if > 250ml p78m and maintain foley  Constipation:  - Bisacodyl 10mg PR15m QDay Spiritual:  - Chaplain consult  Palliative Prophylaxis:   Oral Care, Turn Q2H  Additional Recommendations (Limitations, Scope, Preferences):  Comfort care   Psycho-social/Spiritual:   Desire for further Chaplaincy support: Yes  Additional Recommendations: Education on end of life care   Prognosis: Poor days to weeks  Discharge Planning[facility]   This conversation/these recommendations were discussed with patient primary care team, Dr. RegaladoTyrell Antonion: 1010 Time Out: 1200 Total Time: 110 Greater than 50%  of this time was spent counseling and coordinating care related to the above assessment and plan.  MichelleWolverineam Cell Phone: [phone] secure chat with additional questions, if there is no response within 30 minutes please call the above phone  number  Palliative Medicine Team providers are available by phone from 7am to 7pm daily and can be reached through the team cell phone.  Should this patient require assistance outside of these hours, please call the patient's attending physician.

## 2019-11-21 NOTE — Progress Notes (Signed)
PROGRESS NOTE    [name]  WGY:[id number] DOB: 06/08/1920 DOA: 11/26/2019 PCP: Lawerance Cruel, MD   Brief Narrative: 86 year old with past medical history significant for hypertension, UTI, brought to the ER for altered mental status.  Per history patient has had vomiting black fluid content.  Patient also had Covid and recovered well and went back to facility. Patient was given Levaquin for possible pneumonia/UTI.  Evaluation in the ED blood pressure 106/74, temperature lower 97, oxygen sat 94 on 3 L of oxygen Bilateral pleural effusion and atelectasis.  Patient admitted with FTT, possible pneumonia, Acute encephalopathy, poor oral intake. She continue to be lethargic, poor oral intake. Palliative care consulted. Plan for comfort care and residential hospice facility placement.   Assessment & Plan:   Principal Problem:   Hypokalemia Active Problems:   Permanent atrial fibrillation (HCC)   DNR (do not resuscitate)   Essential hypertension   Anemia   Pressure injury of skin   Palliative care by specialist   Goals of care, counseling/discussion   Terminal care  Hypokalemia; Likely due to poor oral intake.  Hold diuretics Lost IV access. No central line. Will try oral.   A. fib: Continue to monitor hemoglobin Hold Eliquis hemoglobin further dropped to 8   Hypertension: Continue with metoprolol  Anemia, vomiting black content episode; Continue with IV Protonix twice daily Continue to monitor hemoglobin Hb 8.8   Pna pleural effusion; Started ceftriaxone and azithromycin. Lost IV access. Further discussion with family, no central line. No oral antibiotics.  Although no leukocytosis or fever.   Acute metabolic encephalopathy;  Could be related to antibiotics, levaquin , FTT, post covid.  Continue to be sleepy.   FTT; Hypoalbuminemia;  Poor oral intake.  Poor prognosis. Palliative care consulted.    Right arm with edema;  Might be related to IV  infiltration.  Pressure Injury 11/21/2019 Heel Left Deep Tissue Pressure Injury - Purple or maroon localized area of discolored intact skin or blood-filled blister due to damage of underlying soft tissue from pressure and/or shear. (Active)  11/26/2019 2200  Location: Heel  Location Orientation: Left  Staging: Deep Tissue Pressure Injury - Purple or maroon localized area of discolored intact skin or blood-filled blister due to damage of underlying soft tissue from pressure and/or shear.  Wound Description (Comments):   Present on Admission: Yes        Estimated body mass index is 23 kg/m as calculated from the following:   Height as of 10/16/19: 5\' 5"  (1.651 m).   Weight as of this encounter: 62.7 kg.   DVT prophylaxis: SCD Code Status: DNR Family Communication: Grand-Daughter at bedside 10/08 Disposition Plan:  Status is: Inpatient  Remains inpatient appropriate because:Ongoing diagnostic testing needed not appropriate for outpatient work up   Dispo: The patient is from: SNF              Anticipated d/c is to: to be determine              Anticipated d/c date is: 2 days              Patient currently is not medically stable to d/c.        Consultants:   Palliative care   Procedures:   none  Antimicrobials:    Subjective: Patient sleepy. Discussed with granddaughter and palliative, plan not to do central line, or oral antibiotics. Referral for hospice facility.   Objective: Vitals:   11/20/19 2134 11/21/19 0444 11/21/19 0946 11/21/19  1257  BP: 102/67 112/84 105/76 98/73  Pulse: 99 (!) 102 99 68  Resp: (!) 22 20  20   Temp: 98.5 F (36.9 C) 98.3 F (36.8 C)  98.4 F (36.9 C)  TempSrc: Oral Oral  Oral  SpO2: 95% 94% 93%   Weight: 62.7 kg       Intake/Output Summary (Last 24 hours) at 11/21/2019 1412 Last data filed at 11/21/2019 1300 Gross per 24 hour  Intake 1271.14 ml  Output 150 ml  Net 1121.14 ml   Filed Weights   11/20/19 2134  Weight: 62.7 kg      Examination:  General exam: Sleepy. Respiratory system: normal respiration effort.  Central nervous system: Sleepy   Data Reviewed: I have personally reviewed following labs and imaging studies  CBC: Recent Labs  Lab 12/02/2019 1450 12/07/2019 1956 11/19/19 0227 11/20/19 0118 11/21/19 0640  WBC 10.7*  --  10.0 9.7 9.3  NEUTROABS 8.3*  --  7.2  --   --   HGB 10.0* 10.9* 8.6* 9.1* 8.8*  HCT 32.4* 32.0* 28.6* 28.8* 27.5*  MCV 88.5  --  89.9 88.6 87.3  PLT 349  --  284 296 443   Basic Metabolic Panel: Recent Labs  Lab 11/29/2019 1450 11/26/2019 1450 12/03/2019 1744 11/15/2019 1956 11/22/2019 1956 11/15/2019 2333 11/19/19 0227 11/19/19 1125 11/20/19 0838 11/21/19 0640  NA 134*   < >  --  133*  --   --  133* 130* 135 135  K 2.4*   < >  --  2.6*   < > 2.7* 2.7* 5.1 3.8 3.3*  CL 86*  --   --   --   --   --  88* 89* 92* 92*  CO2 38*  --   --   --   --   --  34* 30 28 33*  GLUCOSE 129*  --   --   --   --   --  115* 106* 85 123*  BUN 18  --   --   --   --   --  16 17 13 15   CREATININE 0.74  --   --   --   --   --  0.58 0.60 0.67 0.64  CALCIUM 8.8*  --   --   --   --   --  8.4* 8.3* 8.3* 8.4*  MG  --   --  1.3*  --   --   --  1.7  --   --   --   PHOS  --   --   --   --   --   --  2.6  --   --   --    < > = values in this interval not displayed.   GFR: Estimated Creatinine Clearance: 36.2 mL/min (by C-G formula based on SCr of 0.64 mg/dL). Liver Function Tests: Recent Labs  Lab 11/27/2019 1450 11/19/19 0227  AST 26 23  ALT 15 14  ALKPHOS 59 49  BILITOT 0.8 1.0  PROT 5.5* 4.8*  ALBUMIN 1.9* 1.7*   Recent Labs  Lab 11/30/2019 1450  LIPASE 22   No results for input(s): AMMONIA in the last 168 hours. Coagulation Profile: Recent Labs  Lab 12/09/2019 1744  INR 1.6*   Cardiac Enzymes: No results for input(s): CKTOTAL, CKMB, CKMBINDEX, TROPONINI in the last 168 hours. BNP (last 3 results) No results for input(s): PROBNP in the last 8760 hours. HbA1C: No results for  input(s): HGBA1C in the  last 72 hours. CBG: No results for input(s): GLUCAP in the last 168 hours. Lipid Profile: No results for input(s): CHOL, HDL, LDLCALC, TRIG, CHOLHDL, LDLDIRECT in the last 72 hours. Thyroid Function Tests: Recent Labs    12/08/2019 1948  TSH 4.672*  FREET4 1.20*   Anemia Panel: Recent Labs    11/21/2019 1944 11/15/2019 1948  VITAMINB12  --  686  FOLATE  --  13.4  FERRITIN  --  49  TIBC  --  204*  IRON  --  31  RETICCTPCT 1.9  --    Sepsis Labs: Recent Labs  Lab 12/06/2019 1451 12/11/2019 1943 11/25/2019 2333 11/19/19 0227  LATICACIDVEN 2.8* 2.6* 2.5* 1.9    Recent Results (from the past 240 hour(s))  Resp Panel by RT PCR (RSV, Flu A&B, Covid) - Nasopharyngeal Swab     Status: None   Collection Time: 11/30/2019  3:03 PM   Specimen: Nasopharyngeal Swab  Result Value Ref Range Status   SARS Coronavirus 2 by RT PCR NEGATIVE NEGATIVE Final    Comment: (NOTE) SARS-CoV-2 target nucleic acids are NOT DETECTED.  The SARS-CoV-2 RNA is generally detectable in upper respiratoy specimens during the acute phase of infection. The lowest concentration of SARS-CoV-2 viral copies this assay can detect is 131 copies/mL. A negative result does not preclude SARS-Cov-2 infection and should not be used as the sole basis for treatment or other patient management decisions. A negative result may occur with  improper specimen collection/handling, submission of specimen other than nasopharyngeal swab, presence of viral mutation(s) within the areas targeted by this assay, and inadequate number of viral copies (<131 copies/mL). A negative result must be combined with clinical observations, patient history, and epidemiological information. The expected result is Negative.  Fact Sheet for Patients:  PinkCheek.be  Fact Sheet for Healthcare Providers:  GravelBags.it  This test is no t yet approved or cleared by the [place] FDA and  has been authorized for detection and/or diagnosis of SARS-CoV-2 by FDA under an Emergency Use Authorization (EUA). This EUA will remain  in effect (meaning this test can be used) for the duration of the COVID-19 declaration under Section 564(b)(1) of the Act, 21 U.S.C. section 360bbb-3(b)(1), unless the authorization is terminated or revoked sooner.     Influenza A by PCR NEGATIVE NEGATIVE Final   Influenza B by PCR NEGATIVE NEGATIVE Final    Comment: (NOTE) The Xpert Xpress SARS-CoV-2/FLU/RSV assay is intended as an aid in  the diagnosis of influenza from Nasopharyngeal swab specimens and  should not be used as a sole basis for treatment. Nasal washings and  aspirates are unacceptable for Xpert Xpress SARS-CoV-2/FLU/RSV  testing.  Fact Sheet for Patients: PinkCheek.be  Fact Sheet for Healthcare Providers: GravelBags.it  This test is not yet approved or cleared by the [place] FDA and  has been authorized for detection and/or diagnosis of SARS-CoV-2 by  FDA under an Emergency Use Authorization (EUA). This EUA will remain  in effect (meaning this test can be used) for the duration of the  Covid-19 declaration under Section 564(b)(1) of the Act, 21  U.S.C. section 360bbb-3(b)(1), unless the authorization is  terminated or revoked.    Respiratory Syncytial Virus by PCR NEGATIVE NEGATIVE Final    Comment: (NOTE) Fact Sheet for Patients: PinkCheek.be  Fact Sheet for Healthcare Providers: GravelBags.it  This test is not yet approved or cleared by the [place] FDA and  has been authorized for detection and/or diagnosis of SARS-CoV-2 by  FDA  under an Emergency Use Authorization (EUA). This EUA will remain  in effect (meaning this test can be used) for the duration of the  COVID-19 declaration under Section 564(b)(1) of the Act, 21 U.S.C.   section 360bbb-3(b)(1), unless the authorization is terminated or  revoked. Performed at [facility], [place] [address], [place], [place] [address]   MRSA PCR Screening     Status: None   Collection Time: 11/20/19  1:02 AM   Specimen: Nasopharyngeal  Result Value Ref Range Status   MRSA by PCR NEGATIVE NEGATIVE Final    Comment:        The GeneXpert MRSA Assay (FDA approved for NASAL specimens only), is one component of a comprehensive MRSA colonization surveillance program. It is not intended to diagnose MRSA infection nor to guide or monitor treatment for MRSA infections. Performed at [facility], [place] [address] [place], [place] [address]          Radiology Studies: No results found.      Scheduled Meds:  feeding supplement (ENSURE ENLIVE)  237 mL Oral TID BM   Continuous Infusions:    LOS: 2 days    Time spent: 35 minutes.     Elmarie Shiley, MD [facility]   If 7PM-7AM, please contact night-coverage [email or web address]  11/21/2019, 2:12 PM

## 2019-11-21 NOTE — Progress Notes (Signed)
Unable to tolerate PO meds - would not swallow fluids or medication. Staff RN notified.

## 2019-11-21 NOTE — Progress Notes (Signed)
PIV consult: RUE assessed with ultrasound, no suitable vein found for cannulation. RUE edematous, erythematous and weeping. L arm restricted. Second assess requested.

## 2019-11-21 NOTE — TOC Progression Note (Addendum)
Transition of Care [facility]) - Progression Note    Patient Details  Name: [name] MRN: [id number] Date of Birth: [birth year]  [facility] CM/SW Contact  Sharlet Salina Mila Homer, LCSW Phone Number: 11/21/2019, 2:09 PM  Clinical Narrative:  CSW received consult for a Hospice facility for patient and consult indicated that [facility] was the preference. CSW visited the room and talked with daughter [name] and her son regarding Hospice facility. Confirmed facility choice and call made to Trixie Dredge [phone]), intake staff person and referral made. CSW advised that no beds are available today.   Daughter [name] and her son were updated.   Expected Discharge Plan: [place] (The family is leaning towards home with hospice care but are really undecided at this point.) Barriers to Discharge: Continued Medical Work up  Expected Discharge Plan and Services Expected Discharge Plan: [place] (The family is leaning towards home with hospice care but are really undecided at this point.) In-house Referral: Clinical Social Work Discharge Planning Services: Other - See comment (PCS services)   Living arrangements for the past 2 months: [place] (Patient is LTC resident at [facility])                                     Social Determinants of Health (SDOH) Interventions  None needed or requested at this time  Readmission Risk Interventions No flowsheet data found.

## 2019-11-22 DIAGNOSIS — Z66 Do not resuscitate: Secondary | ICD-10-CM | POA: Diagnosis not present

## 2019-11-22 DIAGNOSIS — E876 Hypokalemia: Secondary | ICD-10-CM | POA: Diagnosis not present

## 2019-11-22 DIAGNOSIS — Z7189 Other specified counseling: Secondary | ICD-10-CM | POA: Diagnosis not present

## 2019-11-22 DIAGNOSIS — Z515 Encounter for palliative care: Secondary | ICD-10-CM | POA: Diagnosis not present

## 2019-11-22 NOTE — Progress Notes (Signed)
[facility] Hospital Liaison note.  Unfortunately,  [facility] is unable to offer a room today. Hospital Liaison will follow up tomorrow or sooner if a room becomes available.   Please do not hesitate to call with questions.    Thank you for the opportunity to participate in this patient's care.  Chrislyn King, BSN, RN [facility] Hospital Liaison (listed on AMION under [facility]osp[facility])    [phone]    (24h on call) 

## 2019-11-22 NOTE — Progress Notes (Signed)
   Palliative Medicine Inpatient Follow Up Note   Reason for consult:  Goals of Care  HPI:  Per intake H&P --> 86 year old with past medical history significant for hypertension, UTI, brought to the ER for altered mental status. Per history patient has had vomiting black fluid content. Patient also had Covidand recovered well and went back to [facility]. Was receiving Levaquin for suspected UTI/PNA.  Palliative care was asked to get involved to aid in goals of care conversations.   Today's Discussion (11/22/2019): Chart reviewed.  [name] appears to be in no distress this morning.  Per review of MAR - patient has not required any as needed medications overnight.  She is resting comfortably upon examination.  She is able to open her eyes when I lightly touch her arm and say her name.  We are awaiting a bed at [facility] so that comfort measures can be continued across the continuum.  I was able to speak to the hospice liaison this morning who confirmed there are no beds available today.  Discussed the importance of continued conversation with family and their  medical providers regarding overall plan of care and treatment options, ensuring decisions are within the context of the patients values and GOCs.  Questions and concerns addressed   Objective Assessment: Vital Signs Vitals:   11/22/19 1045 11/22/19 1051  BP: 137/84 137/84  Pulse: (!) 119 84  Resp: 16 18  Temp:    SpO2: 98% 99%    Intake/Output Summary (Last 24 hours) at 11/22/2019 1309 Last data filed at 11/22/2019 0300 Gross per 24 hour  Intake 9 ml  Output 0 ml  Net 9 ml   Last Weight  Most recent update: 11/20/2019 10:23 PM   Weight  62.7 kg (138 lb 3.7 oz)           Gen:  Elderly F in NAD HEENT: Dry mucous membranes CV: Regular rate and rhythm, no murmurs rubs or gallops PULM: 2LPM ,  rhonchi to auscultation bilaterally  ABD: soft/nontender/nondistended/normal bowel sounds  EXT: No edema  Neuro:  Alert and oriented x1  SUMMARY OF RECOMMENDATIONS DNAR/DNI  [place] a bed at [place]: 25 Greater than 50% of the time was spent in counseling and coordination of care ______________________________________________________________________________________ [place] Team Team Cell Phone: [phone] Please utilize secure chat with additional questions, if there is no response within 30 minutes please call the above phone number  Palliative Medicine Team providers are available by phone from 7am to 7pm daily and can be reached through the team cell phone.  Should this patient require assistance outside of these hours, please call the patient's attending physician.

## 2019-11-22 NOTE — Plan of Care (Signed)
  Problem: Nutrition: Goal: Adequate nutrition will be maintained Outcome: Not Progressing   

## 2019-11-22 NOTE — Progress Notes (Signed)
PROGRESS NOTE    [name]  AJO:[id number] DOB: April 06, 1920 DOA: 12/05/2019 PCP: Lawerance Cruel, MD   Brief Narrative: 86 year old with past medical history significant for hypertension, UTI, brought to the ER for altered mental status.  Per history patient has had vomiting black fluid content.  Patient also had Covid and recovered well and went back to facility. Patient was given Levaquin for possible pneumonia/UTI.  Evaluation in the ED blood pressure 106/74, temperature lower 97, oxygen sat 94 on 3 L of oxygen Bilateral pleural effusion and atelectasis.  Patient admitted with FTT, possible pneumonia, Acute encephalopathy, poor oral intake. She continue to be lethargic, poor oral intake. Palliative care consulted. Plan for comfort care and residential hospice facility placement.   Assessment & Plan:   Principal Problem:   Hypokalemia Active Problems:   Permanent atrial fibrillation (HCC)   DNR (do not resuscitate)   Essential hypertension   Anemia   Pressure injury of skin   Palliative care by specialist   Goals of care, counseling/discussion   Terminal care  Hypokalemia; Likely due to poor oral intake.  Hold diuretics Lost IV access. No central line.  No further lab drawn.   A. fib: Continue to monitor hemoglobin Hold Eliquis hemoglobin further dropped to 8   Hypertension: metoprolol discontinue. Comfort measure.   Anemia, vomiting black content episode; No further lab check.   Pna pleural effusion;  Started ceftriaxone and azithromycin. Lost IV access. Further discussion with family, no central line. No oral antibiotics.    Acute metabolic encephalopathy;  Could be related to antibiotics, levaquin , FTT, post covid.  Continue to be sleepy.   FTT; Hypoalbuminemia;  Poor oral intake.  Poor prognosis. Palliative care consulted.    Right arm with edema;  Might be related to IV infiltration.  Pressure Injury 11/17/2019 Heel Left Deep Tissue Pressure  Injury - Purple or maroon localized area of discolored intact skin or blood-filled blister due to damage of underlying soft tissue from pressure and/or shear. (Active)  12/05/2019 2200  Location: Heel  Location Orientation: Left  Staging: Deep Tissue Pressure Injury - Purple or maroon localized area of discolored intact skin or blood-filled blister due to damage of underlying soft tissue from pressure and/or shear.  Wound Description (Comments):   Present on Admission: Yes        Estimated body mass index is 23 kg/m as calculated from the following:   Height as of 10/16/19: 5\' 5"  (1.651 m).   Weight as of this encounter: 62.7 kg.   DVT prophylaxis: SCD Code Status: DNR Family Communication: Grand-Daughter at bedside 10/08 Disposition Plan:  Status is: Inpatient  Remains inpatient appropriate because:Ongoing diagnostic testing needed not appropriate for outpatient work up   Dispo: The patient is from: SNF              Anticipated d/c is to: to be determine              Anticipated d/c date is: 2 days              Patient currently is not medically stable to d/c.        Consultants:   Palliative care   Procedures:   none  Antimicrobials:    Subjective: Patient resting comfortable.  Open eyes transiently.  Appears in peace.  Objective: Vitals:   11/21/19 0946 11/21/19 1257 11/22/19 1045 11/22/19 1051  BP: 105/76 98/73 137/84 137/84  Pulse: 99 68 (!) 119 84  Resp:  20  16 18  Temp:  98.4 F (36.9 C)    TempSrc:  Oral    SpO2: 93%  98% 99%  Weight:        Intake/Output Summary (Last 24 hours) at 11/22/2019 1624 Last data filed at 11/22/2019 0300 Gross per 24 hour  Intake 9 ml  Output 0 ml  Net 9 ml   Filed Weights   11/20/19 2134  Weight: 62.7 kg    Examination:  General exam: Sleepy  Respiratory system: CTA Central nervous system: Sleepy    Data Reviewed: I have personally reviewed following labs and imaging studies  CBC: Recent Labs  Lab  11/22/2019 1450 11/27/2019 1956 11/19/19 0227 11/20/19 0118 11/21/19 0640  WBC 10.7*  --  10.0 9.7 9.3  NEUTROABS 8.3*  --  7.2  --   --   HGB 10.0* 10.9* 8.6* 9.1* 8.8*  HCT 32.4* 32.0* 28.6* 28.8* 27.5*  MCV 88.5  --  89.9 88.6 87.3  PLT 349  --  284 296 253   Basic Metabolic Panel: Recent Labs  Lab 11/16/2019 1450 12/10/2019 1450 11/20/2019 1744 11/20/2019 1956 11/21/2019 1956 11/27/2019 2333 11/19/19 0227 11/19/19 1125 11/20/19 0838 11/21/19 0640  NA 134*   < >  --  133*  --   --  133* 130* 135 135  K 2.4*   < >  --  2.6*   < > 2.7* 2.7* 5.1 3.8 3.3*  CL 86*  --   --   --   --   --  88* 89* 92* 92*  CO2 38*  --   --   --   --   --  34* 30 28 33*  GLUCOSE 129*  --   --   --   --   --  115* 106* 85 123*  BUN 18  --   --   --   --   --  16 17 13 15   CREATININE 0.74  --   --   --   --   --  0.58 0.60 0.67 0.64  CALCIUM 8.8*  --   --   --   --   --  8.4* 8.3* 8.3* 8.4*  MG  --   --  1.3*  --   --   --  1.7  --   --   --   PHOS  --   --   --   --   --   --  2.6  --   --   --    < > = values in this interval not displayed.   GFR: Estimated Creatinine Clearance: 36.2 mL/min (by C-G formula based on SCr of 0.64 mg/dL). Liver Function Tests: Recent Labs  Lab 11/21/2019 1450 11/19/19 0227  AST 26 23  ALT 15 14  ALKPHOS 59 49  BILITOT 0.8 1.0  PROT 5.5* 4.8*  ALBUMIN 1.9* 1.7*   Recent Labs  Lab 11/22/2019 1450  LIPASE 22   No results for input(s): AMMONIA in the last 168 hours. Coagulation Profile: Recent Labs  Lab 11/26/2019 1744  INR 1.6*   Cardiac Enzymes: No results for input(s): CKTOTAL, CKMB, CKMBINDEX, TROPONINI in the last 168 hours. BNP (last 3 results) No results for input(s): PROBNP in the last 8760 hours. HbA1C: No results for input(s): HGBA1C in the last 72 hours. CBG: No results for input(s): GLUCAP in the last 168 hours. Lipid Profile: No results for input(s): CHOL, HDL, LDLCALC, TRIG, CHOLHDL, LDLDIRECT in the last  72 hours. Thyroid Function Tests: No  results for input(s): TSH, T4TOTAL, FREET4, T3FREE, THYROIDAB in the last 72 hours. Anemia Panel: No results for input(s): VITAMINB12, FOLATE, FERRITIN, TIBC, IRON, RETICCTPCT in the last 72 hours. Sepsis Labs: Recent Labs  Lab 11/16/2019 1451 11/21/2019 1943 11/22/2019 2333 11/19/19 0227  LATICACIDVEN 2.8* 2.6* 2.5* 1.9    Recent Results (from the past 240 hour(s))  Resp Panel by RT PCR (RSV, Flu A&B, Covid) - Nasopharyngeal Swab     Status: None   Collection Time: 12/03/2019  3:03 PM   Specimen: Nasopharyngeal Swab  Result Value Ref Range Status   SARS Coronavirus 2 by RT PCR NEGATIVE NEGATIVE Final    Comment: (NOTE) SARS-CoV-2 target nucleic acids are NOT DETECTED.  The SARS-CoV-2 RNA is generally detectable in upper respiratoy specimens during the acute phase of infection. The lowest concentration of SARS-CoV-2 viral copies this assay can detect is 131 copies/mL. A negative result does not preclude SARS-Cov-2 infection and should not be used as the sole basis for treatment or other patient management decisions. A negative result may occur with  improper specimen collection/handling, submission of specimen other than nasopharyngeal swab, presence of viral mutation(s) within the areas targeted by this assay, and inadequate number of viral copies (<131 copies/mL). A negative result must be combined with clinical observations, patient history, and epidemiological information. The expected result is Negative.  Fact Sheet for Patients:  PinkCheek.be  Fact Sheet for Healthcare Providers:  GravelBags.it  This test is no t yet approved or cleared by the [place] FDA and  has been authorized for detection and/or diagnosis of SARS-CoV-2 by FDA under an Emergency Use Authorization (EUA). This EUA will remain  in effect (meaning this test can be used) for the duration of the COVID-19 declaration under Section 564(b)(1) of the  Act, 21 U.S.C. section 360bbb-3(b)(1), unless the authorization is terminated or revoked sooner.     Influenza A by PCR NEGATIVE NEGATIVE Final   Influenza B by PCR NEGATIVE NEGATIVE Final    Comment: (NOTE) The Xpert Xpress SARS-CoV-2/FLU/RSV assay is intended as an aid in  the diagnosis of influenza from Nasopharyngeal swab specimens and  should not be used as a sole basis for treatment. Nasal washings and  aspirates are unacceptable for Xpert Xpress SARS-CoV-2/FLU/RSV  testing.  Fact Sheet for Patients: PinkCheek.be  Fact Sheet for Healthcare Providers: GravelBags.it  This test is not yet approved or cleared by the [place] FDA and  has been authorized for detection and/or diagnosis of SARS-CoV-2 by  FDA under an Emergency Use Authorization (EUA). This EUA will remain  in effect (meaning this test can be used) for the duration of the  Covid-19 declaration under Section 564(b)(1) of the Act, 21  U.S.C. section 360bbb-3(b)(1), unless the authorization is  terminated or revoked.    Respiratory Syncytial Virus by PCR NEGATIVE NEGATIVE Final    Comment: (NOTE) Fact Sheet for Patients: PinkCheek.be  Fact Sheet for Healthcare Providers: GravelBags.it  This test is not yet approved or cleared by the [place] FDA and  has been authorized for detection and/or diagnosis of SARS-CoV-2 by  FDA under an Emergency Use Authorization (EUA). This EUA will remain  in effect (meaning this test can be used) for the duration of the  COVID-19 declaration under Section 564(b)(1) of the Act, 21 U.S.C.  section 360bbb-3(b)(1), unless the authorization is terminated or  revoked. Performed at [facility], [place] [address] [place], [place] [address]  MRSA PCR Screening     Status: None   Collection Time: 11/20/19  1:02 AM   Specimen: Nasopharyngeal  Result Value  Ref Range Status   MRSA by PCR NEGATIVE NEGATIVE Final    Comment:        The GeneXpert MRSA Assay (FDA approved for NASAL specimens only), is one component of a comprehensive MRSA colonization surveillance program. It is not intended to diagnose MRSA infection nor to guide or monitor treatment for MRSA infections. Performed at [facility], [place] [address], [place], [place] [address]          Radiology Studies: No results found.      Scheduled Meds: . feeding supplement (ENSURE ENLIVE)  237 mL Oral TID BM   Continuous Infusions:    LOS: 3 days    Time spent: 35 minutes.     Elmarie Shiley, MD [facility]   If 7PM-7AM, please contact night-coverage [email or web address]  11/22/2019, 4:24 PM

## 2019-11-23 DIAGNOSIS — Z7189 Other specified counseling: Secondary | ICD-10-CM | POA: Diagnosis not present

## 2019-11-23 DIAGNOSIS — Z515 Encounter for palliative care: Secondary | ICD-10-CM | POA: Diagnosis not present

## 2019-11-23 DIAGNOSIS — Z66 Do not resuscitate: Secondary | ICD-10-CM | POA: Diagnosis not present

## 2019-11-23 MED ORDER — LORAZEPAM 2 MG/ML PO CONC: 1.0000 mg | ORAL | Status: DC | PRN

## 2019-12-15 NOTE — Death Summary Note (Signed)
Death Summary  [name] BWI:[id number] DOB: 05-Jan-1921 DOA: 11/29/19  PCP: Lawerance Cruel, MD PCP/Office notified:   Admit date: 11-29-2019 Date of Death: 12-04-2019  Final Diagnoses:   FTT  Pneumonia  Pleural effusion  Hypoalbuminemia  Acute metabolic encephalopathy  Palliative care by specialist  Terminal care   Hypokalemia   Permanent atrial fibrillation ([place])   DNR (do not resuscitate)   Essential hypertension   Anemia   Pressure injury of skin   Goals of care, counseling/discussion      History of present illness:  86 year old with past medical history significant for hypertension, UTI, brought to the ER for altered mental status.  Per history patient has had vomiting black fluid content.  Patient also had Covid and recovered well and went back to facility. Patient was given Levaquin for possible pneumonia/UTI.  Evaluation in the ED blood pressure 106/74, temperature lower 97, oxygen sat 94 on 3 L of oxygen Bilateral pleural effusion and atelectasis.  Patient admitted with FTT, possible pneumonia, Acute encephalopathy, poor oral intake. She continue to be lethargic, poor oral intake. Palliative care consulted. Plan for comfort care and residential hospice facility placement.    Hospital Course:    Pna pleural effusion;  Started ceftriaxone and azithromycin. Lost IV access. Further discussion with family, no central line. No oral antibiotics.    Acute metabolic encephalopathy; FTT, post covid, Infection.  Could be related to antibiotics, levaquin , FTT, post covid.  Continue to be lethargic.   FTT; Hypoalbuminemia;  Poor oral intake.  Poor prognosis. Palliative care consulted. Comfort care.   A. fib: Continue to monitor hemoglobin Hold Eliquis hemoglobin further dropped to 8   Hypertension: metoprolol discontinue. Comfort measure.   Anemia, vomiting black content episode; No further lab check.    Hypokalemia; Likely due to  poor oral intake.  Hold diuretics Lost IV access. No central line.  No further lab drawn.   Right arm with edema;  Might be related to IV infiltration.  Pressure Injury 29-Nov-2019 Heel Left Deep Tissue Pressure Injury - Purple or maroon localized area of discolored intact skin or blood-filled blister due to damage of underlying soft tissue from pressure and/or shear. (Active)  November 29, 2019 2200  Location: Heel  Location Orientation: Left  Staging: Deep Tissue Pressure Injury - Purple or maroon localized area of discolored intact skin or blood-filled blister due to damage of underlying soft tissue from pressure and/or shear.  Wound Description (Comments):   Present on Admission: Yes       Time: 8:57am.   Signed:  Vyron Fronczak A Classie Weng  [facility] December 04, 2019, 11:40 AM

## 2019-12-15 NOTE — Progress Notes (Signed)
   Palliative Medicine Inpatient Follow Up Note   Reason for consult:  Goals of Care  HPI:  Per intake H&P --> 86 year old with past medical history significant for hypertension, UTI, brought to the ER for altered mental status. Per history patient has had vomiting black fluid content. Patient also had Covidand recovered well and went back to [facility]. Was receiving Levaquin for suspected UTI/PNA.  Palliative care was asked to get involved to aid in goals of care conversations.   Today's Discussion (12-23-19): Chart reviewed.  Sholanda appears to be  distress this morning.  Per review of MAR - patient has not required any as needed medications overnight.  Nursing asked to provide some morphine SL.  Discussed the importance of continued conversation with family and their  medical providers regarding overall plan of care and treatment options, ensuring decisions are within the context of the patients values and GOCs.  Questions and concerns addressed   Objective Assessment: Vital Signs Vitals:   11/22/19 1045 11/22/19 1051  BP: 137/84 137/84  Pulse: (!) 119 84  Resp: 16 18  Temp:    SpO2: 98% 99%    Intake/Output Summary (Last 24 hours) at 2019-12-23 0954 Last data filed at 2019-12-23 0500 Gross per 24 hour  Intake 30 ml  Output 0 ml  Net 30 ml   Last Weight  Most recent update: 11/20/2019 10:23 PM   Weight  62.7 kg (138 lb 3.7 oz)           Gen:  Elderly F in NAD HEENT: Dry mucous membranes CV: Regular rate and rhythm, no murmurs rubs or gallops PULM: 2LPM [place],  rhonchi to auscultation bilaterally  ABD: soft/nontender/nondistended/normal bowel sounds  EXT: No edema  Neuro: Alert and oriented x1  SUMMARY OF RECOMMENDATIONS DNAR/DNI  [place] a bed at [facility]  Time Spent: 25 Greater than 50% of the time was spent in counseling and coordination of  care ______________________________________________________________________________________ Addendum: Staff informed me that patient had passed away at 0857 this morning.  I was able to speak to patient's daughter [name] by telephone to offer condolences.  Spiritual support has been offered and declined.  PMT will be available for ongoing support.  [place] Team Team Cell Phone: [phone] Please utilize secure chat with additional questions, if there is no response within 30 minutes please call the above phone number  Palliative Medicine Team providers are available by phone from 7am to 7pm daily and can be reached through the team cell phone.  Should this patient require assistance outside of these hours, please call the patient's attending physician.

## 2019-12-15 NOTE — Progress Notes (Addendum)
Manufacturing engineer [facility])  [facility] does not have any open beds for [name] today.  [facility] will update as soon as one is open.  Venia Carbon RN, BSN, [facility] Liaison

## 2019-12-15 NOTE — Progress Notes (Signed)
Contacted [employer], spoke with April Shore, not a donor candidate, reference number [id number].

## 2019-12-15 NOTE — Progress Notes (Signed)
Patient expired at 57; verified with [facility] RN. MD notified. Daughter [name] made aware.

## 2019-12-15 NOTE — Progress Notes (Signed)
   12-Dec-2019 1211  Clinical Encounter Type  Visited With Family;Health care provider  Visit Type Death;Initial  Referral From Nurse   Chaplain responded to patient's death. Chaplain extended hospitality. Chaplain introduced spiritual care services. Spiritual care services available as needed.   Jeri Lager, Chaplain

## 2019-12-15 DEATH — deceased

## 2022-03-03 IMAGING — DX DG CHEST 1V PORT
1 series · 1 of 1 positions shown · non-contrast
Comparison: 10/14/2019

CLINICAL DATA: Altered mental status.

EXAM:
PORTABLE CHEST 1 VIEW

[chest ap]
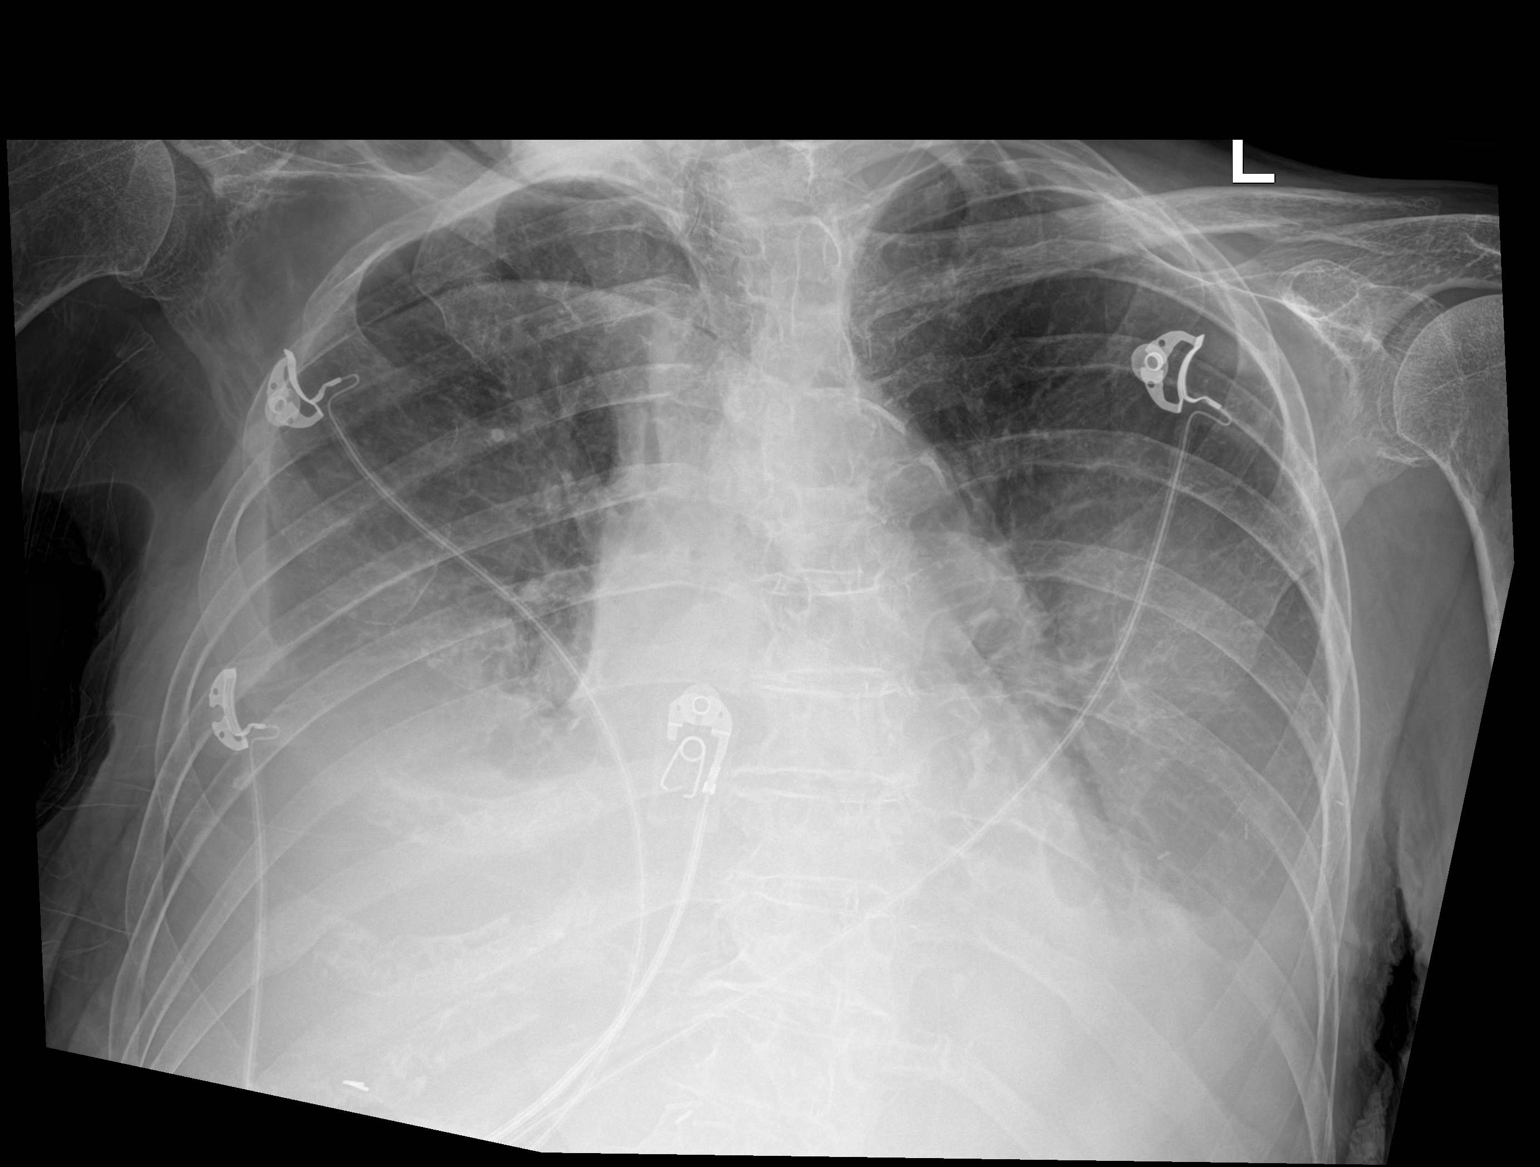

[1 of 1 positions shown; findings below may reference images not displayed]

FINDINGS: The cardiac silhouette, mediastinal and hilar contours are within
normal limits and stable given the patient's age. There is moderate
tortuosity and calcification of the thoracic aorta.

Persistent moderate-sized bilateral pleural effusions, right greater
than left with significant overlying atelectasis. No definite
infiltrates or pneumothorax.
IMPRESSION: Persistent moderate-sized bilateral pleural effusions with
significant overlying atelectasis.
